# Patient Record
Sex: Male | Born: 1960 | Race: White | Hispanic: No | Marital: Married | Smoking: Never smoker
Health system: Southern US, Community
[De-identification: ages and names within clinical notes are randomized; demographics above are authoritative.]

## PROBLEM LIST (undated history)

## (undated) DIAGNOSIS — G4733 Obstructive sleep apnea (adult) (pediatric): Secondary | ICD-10-CM

## (undated) DIAGNOSIS — E785 Hyperlipidemia, unspecified: Secondary | ICD-10-CM

## (undated) DIAGNOSIS — I10 Essential (primary) hypertension: Secondary | ICD-10-CM

## (undated) DIAGNOSIS — T7840XA Allergy, unspecified, initial encounter: Secondary | ICD-10-CM

## (undated) HISTORY — DX: Hyperlipidemia, unspecified: E78.5

## (undated) HISTORY — DX: Essential (primary) hypertension: I10

## (undated) HISTORY — DX: Allergy, unspecified, initial encounter: T78.40XA

## (undated) HISTORY — DX: Obstructive sleep apnea (adult) (pediatric): G47.33

---

## 1981-04-11 HISTORY — PX: APPENDECTOMY: SHX54

## 2005-04-11 HISTORY — PX: HAND SURGERY: SHX662

## 2005-07-09 ENCOUNTER — Ambulatory Visit (HOSPITAL_COMMUNITY): Admission: RE | Admit: 2005-07-09 | Discharge: 2005-07-09 | Payer: Self-pay | Admitting: Orthopedic Surgery

## 2008-03-31 ENCOUNTER — Encounter: Admission: RE | Admit: 2008-03-31 | Discharge: 2008-03-31 | Payer: Self-pay | Admitting: Orthopaedic Surgery

## 2010-08-27 NOTE — Op Note (Signed)
Oscar Moore, Oscar Moore               ACCOUNT NO.:  [id number]   MEDICAL RECORD NO.:  [id number]          PATIENT TYPE:  AMB   LOCATION:  SDS                          FACILITY:  [facility]   PHYSICIAN:  Dionne Ano. Gramig III, M.D.DATE OF BIRTH:  November 12, 1960   DATE OF PROCEDURE:  DATE OF DISCHARGE:                                 OPERATIVE REPORT   PREOPERATIVE DIAGNOSIS:  Displaced first metacarpal fracture, right thumb  (nascent malunion).   POSTOPERATIVE DIAGNOSIS:  Displaced first metacarpal fracture, right thumb  (nascent malunion).   PROCEDURES:  1.  Open reduction and internal fixation, first metacarpal fracture, right      thumb, secondary to nascent malunion, with condylar blade plate of the      2.0 variety and interfragmentary screw technique.  2.  Superficial radial nerve neurolysis, extensive.  3.  Stress radiography.   SURGEON:  Dionne Ano. Amanda Pea, M.D.   ASSISTANT:  None.   COMPLICATIONS:  None.   ANESTHESIA:  General.   TOURNIQUET TIME:  Less than 90 minutes.   DRAINS:  None.   ESTIMATED BLOOD LOSS:  Minimal.   INDICATION FOR PROCEDURE:  This patient is a pleasant male who presents with  the above-mentioned diagnosis.  I have counseled him in regard to the risks  and benefits of surgery including the risk of infection, bleeding,  anesthesia, damage to normal structures and failure of surgery to accomplish  its intended goals of relieving symptoms and restoring function.  With this  in mind he desires to proceed.  All questions have been encouraged and  answered preoperatively.  This patient was recently seen in [facility].  He subsequently presented to my office greater than three weeks  after his injury with a nascent malunion.  I have discussed with him options  for treatment.  Given the severe deformity, collapse and poor position and  function, I recommended takedown of the nascent malunion and ORIF.  I have  discussed with the patient risks and  benefit, time frame and duration of  recovery, do's and don'ts, etc.  With this in mind, he desires to proceed.   OPERATIVE PROCEDURE:  The patient was given preoperative Ancef, taken to the  operative suite, counseled, the permit was signed.  The extremity to be  operated on was identified, and he then underwent a general anesthesia under  the direction of Dr. Gelene Mink.  He was placed supine, appropriately padded,  prepped and draped in the usual sterile fashion with Betadine scrub and  paint of the 10-minute variety.  The patient then had the arm elevated, the  tourniquet was inflated to 250 mmHg, and the incision was made dorsal  radially between the dorsal and palmar skin regions.  Dissection was carried  down and the superficial radial nerve was identified.  It was encased in a  wad of inflammatory tissue.  It was very carefully under 4.0 loupe  magnification dissected and removed out of harm's way for the remainder of  the case.  This was an extensive superficial radial nerve neurolysis to  sweep this out of the inflammatory  mass of tissue where it was caught.  Following this I then accessed the metacarpal and the metacarpal was in a  malunited-type state.  I performed a takedown of the malunion with a  combination of curette, Freer elevator, periosteal elevator and sharp knife  blade.  The patient had more than four fragments that were highly unstable  once the takedown occurred.  The patient had a gentle piecing of the  fracture together, provisional fixation with K-wires and clamps were held.  X-rays were checked under stress fluoroscopy and all looked quite well.  At  this time I then performed placement of an interfragmentary screw proximal  to distal engaging a butterfly fragment.  This was a 2.0 screw.  Following  this a 2.0 screw was placed dorsal distal to volar proximal.  This engaged  the proximal piece.  Following this a 2.0 condylar blade plate was placed  according to  standard AO technique.  Screws were placed to secure the  fracture.  Following this, all provisional reduction was removed and x-rays  checked.  The patient had excellent integrity of the bone in terms of its  stability and alignment.  X-rays AP, lateral and oblique were taken, and  life fluoroscopy ensured good stability of the construct and restoration of  the proper anatomic alignment.  I was pleased with this, deflated the  tourniquet, covered the plate with Vicryl suture utilizing the periosteal  edges and following this noted that the deep radial artery branch and the  EPB tendon were intact and looking well.  I then closed the wound with  Prolene and ensured that the superficial radial nerve was out of harm's way  during this.  Following this he was placed in a well-padded dressing  followed by a splint, thumb spica in nature, and was transferred to the  recovery room.  He will be discharged on Keflex, Percocet, Robaxin, Peri-  Colace, and will return to see me in the office in 10 days.  All questions  have been encouraged and answered.           ______________________________  Dionne Ano. Everlene Other, M.D.     Nash Mantis  D:  07/09/2005  T:  07/12/2005  Job:  [id number]

## 2012-01-06 ENCOUNTER — Other Ambulatory Visit: Payer: Self-pay | Admitting: Gastroenterology

## 2012-01-06 DIAGNOSIS — R109 Unspecified abdominal pain: Secondary | ICD-10-CM

## 2012-01-23 ENCOUNTER — Ambulatory Visit
Admission: RE | Admit: 2012-01-23 | Discharge: 2012-01-23 | Disposition: A | Discharge status: Home / Self Care | Payer: 59 | Source: Ambulatory Visit | Attending: Gastroenterology | Admitting: Gastroenterology

## 2012-01-23 DIAGNOSIS — R109 Unspecified abdominal pain: Secondary | ICD-10-CM

## 2012-01-23 MED ORDER — IOHEXOL 300 MG/ML  SOLN
100.0000 mL | Freq: Once | INTRAMUSCULAR | Status: AC | PRN
  Administered 2012-01-23: 100 mL via INTRAVENOUS

## 2012-01-25 ENCOUNTER — Other Ambulatory Visit: Payer: Self-pay | Admitting: Gastroenterology

## 2012-01-25 DIAGNOSIS — R109 Unspecified abdominal pain: Secondary | ICD-10-CM

## 2012-01-31 ENCOUNTER — Ambulatory Visit
Admission: RE | Admit: 2012-01-31 | Discharge: 2012-01-31 | Disposition: A | Discharge status: Home / Self Care | Payer: 59 | Source: Ambulatory Visit | Attending: Gastroenterology | Admitting: Gastroenterology

## 2012-01-31 DIAGNOSIS — R109 Unspecified abdominal pain: Secondary | ICD-10-CM

## 2012-03-21 LAB — HM COLONOSCOPY

## 2013-02-01 ENCOUNTER — Other Ambulatory Visit: Payer: Self-pay | Admitting: Gastroenterology

## 2013-02-01 DIAGNOSIS — R911 Solitary pulmonary nodule: Secondary | ICD-10-CM

## 2013-02-19 ENCOUNTER — Other Ambulatory Visit: Payer: 59

## 2013-02-22 ENCOUNTER — Ambulatory Visit
Admission: RE | Admit: 2013-02-22 | Discharge: 2013-02-22 | Disposition: A | Discharge status: Home / Self Care | Payer: 59 | Source: Ambulatory Visit | Attending: Gastroenterology | Admitting: Gastroenterology

## 2013-02-22 DIAGNOSIS — R911 Solitary pulmonary nodule: Secondary | ICD-10-CM

## 2013-02-22 MED ORDER — IOHEXOL 300 MG/ML  SOLN
75.0000 mL | Freq: Once | INTRAMUSCULAR | Status: AC | PRN
  Administered 2013-02-22: 75 mL via INTRAVENOUS

## 2013-05-08 ENCOUNTER — Other Ambulatory Visit: Payer: Self-pay | Admitting: Internal Medicine

## 2013-07-17 ENCOUNTER — Other Ambulatory Visit: Payer: Self-pay | Admitting: Emergency Medicine

## 2013-07-23 DIAGNOSIS — M5136 Other intervertebral disc degeneration, lumbar region: Secondary | ICD-10-CM | POA: Insufficient documentation

## 2013-07-23 DIAGNOSIS — G4733 Obstructive sleep apnea (adult) (pediatric): Secondary | ICD-10-CM | POA: Insufficient documentation

## 2013-07-23 DIAGNOSIS — I10 Essential (primary) hypertension: Secondary | ICD-10-CM | POA: Insufficient documentation

## 2013-07-23 DIAGNOSIS — E349 Endocrine disorder, unspecified: Secondary | ICD-10-CM | POA: Insufficient documentation

## 2013-07-23 DIAGNOSIS — E782 Mixed hyperlipidemia: Secondary | ICD-10-CM | POA: Insufficient documentation

## 2013-07-23 DIAGNOSIS — E785 Hyperlipidemia, unspecified: Secondary | ICD-10-CM

## 2013-07-24 ENCOUNTER — Encounter: Payer: Self-pay | Admitting: Internal Medicine

## 2013-07-24 ENCOUNTER — Ambulatory Visit (INDEPENDENT_AMBULATORY_CARE_PROVIDER_SITE_OTHER): Payer: 59 | Admitting: Internal Medicine

## 2013-07-24 VITALS — BP 124/76 | HR 76 | Temp 98.1°F | Resp 16 | Ht 68.0 in | Wt 192.6 lb

## 2013-07-24 DIAGNOSIS — I10 Essential (primary) hypertension: Secondary | ICD-10-CM

## 2013-07-24 DIAGNOSIS — E785 Hyperlipidemia, unspecified: Secondary | ICD-10-CM

## 2013-07-24 DIAGNOSIS — Z79899 Other long term (current) drug therapy: Secondary | ICD-10-CM

## 2013-07-24 DIAGNOSIS — E291 Testicular hypofunction: Secondary | ICD-10-CM

## 2013-07-24 DIAGNOSIS — E559 Vitamin D deficiency, unspecified: Secondary | ICD-10-CM

## 2013-07-24 DIAGNOSIS — R7309 Other abnormal glucose: Secondary | ICD-10-CM

## 2013-07-24 LAB — CBC WITH DIFFERENTIAL/PLATELET
BASOS ABS: 0.1 10*3/uL (ref 0.0–0.1)
Basophils Relative: 1 % (ref 0–1)
Eosinophils Absolute: 0.2 10*3/uL (ref 0.0–0.7)
Eosinophils Relative: 3 % (ref 0–5)
HCT: 48.2 % (ref 39.0–52.0)
HEMOGLOBIN: 16.7 g/dL (ref 13.0–17.0)
Lymphocytes Relative: 22 % (ref 12–46)
Lymphs Abs: 1.7 10*3/uL (ref 0.7–4.0)
MCH: 27.6 pg (ref 26.0–34.0)
MCHC: 34.6 g/dL (ref 30.0–36.0)
MCV: 79.8 fL (ref 78.0–100.0)
MONOS PCT: 6 % (ref 3–12)
Monocytes Absolute: 0.5 10*3/uL (ref 0.1–1.0)
Neutro Abs: 5.2 10*3/uL (ref 1.7–7.7)
Neutrophils Relative %: 68 % (ref 43–77)
PLATELETS: 277 10*3/uL (ref 150–400)
RBC: 6.04 MIL/uL — AB (ref 4.22–5.81)
RDW: 13.8 % (ref 11.5–15.5)
WBC: 7.7 10*3/uL (ref 4.0–10.5)

## 2013-07-24 LAB — HEMOGLOBIN A1C
Hgb A1c MFr Bld: 5.4 % (ref <5.7)
Mean Plasma Glucose: 108 mg/dL (ref <117)

## 2013-07-24 MED ORDER — TADALAFIL 5 MG PO TABS: 5.0000 mg | ORAL_TABLET | Freq: Every day | ORAL | Status: DC | PRN

## 2013-07-24 MED ORDER — BUTALBITAL-APAP-CAFF-COD 50-325-40-30 MG PO CAPS: ORAL_CAPSULE | ORAL | Status: DC

## 2013-07-24 NOTE — Progress Notes (Signed)
Patient ID: Oscar Moore, male   DOB: [birth year], 53 y.o.   MRN: [id number]    This very nice 53 y.o. MWM lost to F/U who presents with Hx/o Hypertension, Hyperlipidemia, Abn. Glucose and Vitamin D Deficiency. Also he notes a tender cyst medial to his Rt patella.   HTN predates since 2007. BP has been controlled at home. Today's BP: 124/76 mmHg . Patient denies any cardiac type chest pain, palpitations, dyspnea/orthopnea/PND, dizziness, claudication, or dependent edema.    Hyperlipidemia also noted since 2007 is controlled with diet & meds.  Last Cholesterol was 130, Triglycerides were 245, HDL 31  and LDL 50 - at goal. Patient denies myalgias or other med SE's.    Also, the patient is screened for  PreDiabetes/insulin resistance with last A1c of 4.9% in June 2014. Patient denies any symptoms of reactive hypoglycemia, diabetic polys, paresthesias or visual blurring.   Patient has Testosterone Deficiency and reports improved sense of well being on therapy and his lat shot was 5 days ago. Further, Patient has history of Vitamin D Deficiency with last vitamin D of 83 in June 2014. Patient supplements vitamin D without any suspected side-effects.  Medication Sig  . KRILL OIL PO Take by mouth daily.  Marland Kitchen lisinopril (PRINIVIL,ZESTRIL) 20 MG tablet Take 20 mg by mouth daily.  Marland Kitchen LYRICA 75 MG capsule TAKE ONE CAPSULE TWICE A DAY FOR PAIN  . rosuvastatin (CRESTOR) 5 MG tablet Take 5 mg by mouth daily.  Marland Kitchen testosterone cypionate (DEPOTESTOTERONE CYPIONATE) 200 MG/ML injection INJECT 2 MLS INTRAMUSCULARLY EVERY 2 WEEKS  . traMADol (ULTRAM) 50 MG tablet TAKE 1 TO 2 TABLETS BY MOUTH UP TO 4 TIMES A DAY AS NEEDED     Allergies no known allergies  PMHx:   Past Medical History  Diagnosis Date  . Hyperlipidemia   . Hypertension   . Allergy   . DDD (degenerative disc disease)   . OSA (obstructive sleep apnea)   . Other testicular hypofunction    FHx:    Reviewed / unchanged  SHx:    Reviewed /  unchanged   Systems Review: Constitutional: Denies fever, chills, wt changes, headaches, insomnia, fatigue, night sweats, change in appetite. Eyes: Denies redness, blurred vision, diplopia, discharge, itchy, watery eyes.  ENT: Denies discharge, congestion, post nasal drip, epistaxis, sore throat, earache, hearing loss, dental pain, tinnitus, vertigo, sinus pain, snoring.  CV: Denies chest pain, palpitations, irregular heartbeat, syncope, dyspnea, diaphoresis, orthopnea, PND, claudication, edema. Respiratory: denies cough, dyspnea, DOE, pleurisy, hoarseness, laryngitis, wheezing.  Gastrointestinal: Denies dysphagia, odynophagia, heartburn, reflux, water brash, abdominal pain or cramps, nausea, vomiting, bloating, diarrhea, constipation, hematemesis, melena, hematochezia,  or hemorrhoids. Genitourinary: Denies dysuria, frequency, urgency, nocturia, hesitancy, discharge, hematuria, flank pain. Musculoskeletal: Denies arthralgias, myalgias, stiffness, jt. swelling, pain, limp, strain/sprain.  Skin: Denies pruritus, rash, hives, warts, acne, eczema, change in skin lesion(s). Neuro: No weakness, tremor, incoordination, spasms, paresthesia, or pain. Psychiatric: Denies confusion, memory loss, or sensory loss. Endo: Denies change in weight, skin, hair change.  Heme/Lymph: No excessive bleeding, bruising, orenlarged lymph nodes.   Exam:  BP 124/76  Pulse 76  Temp 98.1 F   Resp 16  Ht 5\' 8"    Wt 192 lb 9.6 oz   BMI 29.29 kg/m2  Appears well nourished - in no distress. Eyes: PERRLA, EOMs, conjunctiva no swelling or erythema. Sinuses: No frontal/maxillary tenderness ENT/Mouth: EAC's clear, TM's nl w/o erythema, bulging. Nares clear w/o erythema, swelling, exudates. Oropharynx clear without erythema or exudates. Oral hygiene is  good. Tongue normal, non obstructing. Hearing intact.  Neck: Supple. Thyroid nl. Car 2+/2+ without bruits, nodes or JVD. Chest: Respirations nl with BS clear & equal w/o  rales, rhonchi, wheezing or stridor.  Cor: Heart sounds normal w/ regular rate and rhythm without sig. murmurs, gallops, clicks, or rubs. Peripheral pulses normal and equal  without edema.  Abdomen: Soft & bowel sounds normal. Non-tender w/o guarding, rebound, hernias, masses, or organomegaly.  Lymphatics: Unremarkable.  Musculoskeletal: Full ROM all peripheral extremities, joint stability, 5/5 strength, and normal gait.  Skin: Warm, dry without exposed rashes, lesions, ecchymosis apparent.  Neuro: Cranial nerves intact, reflexes equal bilaterally. Sensory-motor testing grossly intact. Tendon reflexes grossly intact.  Pysch: Alert & oriented x 3. Insight and judgement nl & appropriate. No ideations.  Assessment and Plan:  1. Hypertension - Continue monitor blood pressure at home. Continue diet/meds same.  2. Hyperlipidemia - Continue diet/meds, exercise,& lifestyle modifications. Continue monitor periodic cholesterol/liver & renal functions   3. Hx/o Abnormal Glucose - Continue diet, exercise, lifestyle modifications. Monitor appropriate labs.  4. Vitamin D Deficiency - Continue supplementation.  Recommended regular exercise, BP monitoring, weight control, and discussed med and SE's. Recommended labs to assess and monitor clinical status. Further disposition pending results of labs.

## 2013-07-24 NOTE — Patient Instructions (Signed)
Recommend  the book   "The End of Dieting" by Dr Excell Seltzer  For information on healthy eating to get off all BP  & Cholesterol meds  And avoid getting Heart Disease Cancer and Alzheimer's Disease      Hypertension As your heart beats, it forces blood through your arteries. This force is your blood pressure. If the pressure is too high, it is called hypertension (HTN) or high blood pressure. HTN is dangerous because you may have it and not know it. High blood pressure may mean that your heart has to work harder to pump blood. Your arteries may be narrow or stiff. The extra work puts you at risk for heart disease, stroke, and other problems.  Blood pressure consists of two numbers, a higher number over a lower, 110/72, for example. It is stated as "110 over 72." The ideal is below 120 for the top number (systolic) and under 80 for the bottom (diastolic). Write down your blood pressure today. You should pay close attention to your blood pressure if you have certain conditions such as:  Heart failure.  Prior heart attack.  Diabetes  Chronic kidney disease.  Prior stroke.  Multiple risk factors for heart disease. To see if you have HTN, your blood pressure should be measured while you are seated with your arm held at the level of the heart. It should be measured at least twice. A one-time elevated blood pressure reading (especially in the Emergency Department) does not mean that you need treatment. There may be conditions in which the blood pressure is different between your right and left arms. It is important to see your caregiver soon for a recheck. Most people have essential hypertension which means that there is not a specific cause. This type of high blood pressure may be lowered by changing lifestyle factors such as:  Stress.  Smoking.  Lack of exercise.  Excessive weight.  Drug/tobacco/alcohol use.  Eating less salt. Most people do not have symptoms from high blood  pressure until it has caused damage to the body. Effective treatment can often prevent, delay or reduce that damage. TREATMENT  When a cause has been identified, treatment for high blood pressure is directed at the cause. There are a large number of medications to treat HTN. These fall into several categories, and your caregiver will help you select the medicines that are best for you. Medications may have side effects. You should review side effects with your caregiver. If your blood pressure stays high after you have made lifestyle changes or started on medicines,   Your medication(s) may need to be changed.  Other problems may need to be addressed.  Be certain you understand your prescriptions, and know how and when to take your medicine.  Be sure to follow up with your caregiver within the time frame advised (usually within two weeks) to have your blood pressure rechecked and to review your medications.  If you are taking more than one medicine to lower your blood pressure, make sure you know how and at what times they should be taken. Taking two medicines at the same time can result in blood pressure that is too low. SEEK IMMEDIATE MEDICAL CARE IF:  You develop a severe headache, blurred or changing vision, or confusion.  You have unusual weakness or numbness, or a faint feeling.  You have severe chest or abdominal pain, vomiting, or breathing problems. MAKE SURE YOU:   Understand these instructions.  Will watch your condition.  Will  get help right away if you are not doing well or get worse.   Diabetes and Exercise Exercising regularly is important. It is not just about losing weight. It has many health benefits, such as:  Improving your overall fitness, flexibility, and endurance.  Increasing your bone density.  Helping with weight control.  Decreasing your body fat.  Increasing your muscle strength.  Reducing stress and tension.  Improving your overall  health. People with diabetes who exercise gain additional benefits because exercise:  Reduces appetite.  Improves the body's use of blood sugar (glucose).  Helps lower or control blood glucose.  Decreases blood pressure.  Helps control blood lipids (such as cholesterol and triglycerides).  Improves the body's use of the hormone insulin by:  Increasing the body's insulin sensitivity.  Reducing the body's insulin needs.  Decreases the risk for heart disease because exercising:  Lowers cholesterol and triglycerides levels.  Increases the levels of good cholesterol (such as high-density lipoproteins [HDL]) in the body.  Lowers blood glucose levels. YOUR ACTIVITY PLAN  Choose an activity that you enjoy and set realistic goals. Your health care provider or diabetes educator can help you make an activity plan that works for you. You can break activities into 2 or 3 sessions throughout the day. Doing so is as good as one long session. Exercise ideas include:  Taking the dog for a walk.  Taking the stairs instead of the elevator.  Dancing to your favorite song.  Doing your favorite exercise with a friend. RECOMMENDATIONS FOR EXERCISING WITH TYPE 1 OR TYPE 2 DIABETES   Check your blood glucose before exercising. If blood glucose levels are greater than 240 mg/dL, check for urine ketones. Do not exercise if ketones are present.  Avoid injecting insulin into areas of the body that are going to be exercised. For example, avoid injecting insulin into:  The arms when playing tennis.  The legs when jogging.  Keep a record of:  Food intake before and after you exercise.  Expected peak times of insulin action.  Blood glucose levels before and after you exercise.  The type and amount of exercise you have done.  Review your records with your health care provider. Your health care provider will help you to develop guidelines for adjusting food intake and insulin amounts before and  after exercising.  If you take insulin or oral hypoglycemic agents, watch for signs and symptoms of hypoglycemia. They include:  Dizziness.  Shaking.  Sweating.  Chills.  Confusion.  Drink plenty of water while you exercise to prevent dehydration or heat stroke. Body water is lost during exercise and must be replaced.  Talk to your health care provider before starting an exercise program to make sure it is safe for you. Remember, almost any type of activity is better than none.    Cholesterol Cholesterol is a white, waxy, fat-like protein needed by your body in small amounts. The liver makes all the cholesterol you need. It is carried from the liver by the blood through the blood vessels. Deposits (plaque) may build up on blood vessel walls. This makes the arteries narrower and stiffer. Plaque increases the risk for heart attack and stroke. You cannot feel your cholesterol level even if it is very high. The only way to know is by a blood test to check your lipid (fats) levels. Once you know your cholesterol levels, you should keep a record of the test results. Work with your caregiver to to keep your levels in the  desired range. WHAT THE RESULTS MEAN:  Total cholesterol is a rough measure of all the cholesterol in your blood.  LDL is the so-called bad cholesterol. This is the type that deposits cholesterol in the walls of the arteries. You want this level to be low.  HDL is the good cholesterol because it cleans the arteries and carries the LDL away. You want this level to be high.  Triglycerides are fat that the body can either burn for energy or store. High levels are closely linked to heart disease. DESIRED LEVELS:  Total cholesterol below 200.  LDL below 100 for people at risk, below 70 for very high risk.  HDL above 50 is good, above 60 is best.  Triglycerides below 150. HOW TO LOWER YOUR CHOLESTEROL:  Diet.  Choose fish or white meat chicken and [place], roasted or  baked. Limit fatty cuts of red meat, fried foods, and processed meats, such as sausage and lunch meat.  Eat lots of fresh fruits and vegetables. Choose whole grains, beans, pasta, potatoes and cereals.  Use only small amounts of olive, corn or canola oils. Avoid butter, mayonnaise, shortening or palm kernel oils. Avoid foods with trans-fats.  Use skim/nonfat milk and low-fat/nonfat yogurt and cheeses. Avoid whole milk, cream, ice cream, egg yolks and cheeses. Healthy desserts include angel food cake, ginger snaps, animal crackers, hard candy, popsicles, and low-fat/nonfat frozen yogurt. Avoid pastries, cakes, pies and cookies.  Exercise.  A regular program helps decrease LDL and raises HDL.  Helps with weight control.  Do things that increase your activity level like gardening, walking, or taking the stairs.  Medication.  May be prescribed by your caregiver to help lowering cholesterol and the risk for heart disease.  You may need medicine even if your levels are normal if you have several risk factors. HOME CARE INSTRUCTIONS   Follow your diet and exercise programs as suggested by your caregiver.  Take medications as directed.  Have blood work done when your caregiver feels it is necessary. MAKE SURE YOU:   Understand these instructions.  Will watch your condition.  Will get help right away if you are not doing well or get worse.      Vitamin D Deficiency Vitamin D is an important vitamin that your body needs. Having too little of it in your body is called a deficiency. A very bad deficiency can make your bones soft and can cause a condition called rickets.  Vitamin D is important to your body for different reasons, such as:   It helps your body absorb 2 minerals called calcium and phosphorus.  It helps make your bones healthy.  It may prevent some diseases, such as diabetes and multiple sclerosis.  It helps your muscles and heart. You can get vitamin D in several  ways. It is a natural part of some foods. The vitamin is also added to some dairy products and cereals. Some people take vitamin D supplements. Also, your body makes vitamin D when you are in the sun. It changes the sun's rays into a form of the vitamin that your body can use. CAUSES   Not eating enough foods that contain vitamin D.  Not getting enough sunlight.  Having certain digestive system diseases that make it hard to absorb vitamin D. These diseases include Crohn's disease, chronic pancreatitis, and cystic fibrosis.  Having a surgery in which part of the stomach or small intestine is removed.  Being obese. Fat cells pull vitamin D out of your  blood. That means that obese people may not have enough vitamin D left in their blood and in other body tissues.  Having chronic kidney or liver disease. RISK FACTORS Risk factors are things that make you more likely to develop a vitamin D deficiency. They include:  Being older.  Not being able to get outside very much.  Living in a nursing home.  Having had broken bones.  Having weak or thin bones (osteoporosis).  Having a disease or condition that changes how your body absorbs vitamin D.  Having dark skin.  Some medicines such as seizure medicines or steroids.  Being overweight or obese. SYMPTOMS Mild cases of vitamin D deficiency may not have any symptoms. If you have a very bad case, symptoms may include:  Bone pain.  Muscle pain.  Falling often.  Broken bones caused by a minor injury, due to osteoporosis. DIAGNOSIS A blood test is the best way to tell if you have a vitamin D deficiency. TREATMENT Vitamin D deficiency can be treated in different ways. Treatment for vitamin D deficiency depends on what is causing it. Options include:  Taking vitamin D supplements.  Taking a calcium supplement. Your caregiver will suggest what dose is best for you. HOME CARE INSTRUCTIONS  Take any supplements that your caregiver  prescribes. Follow the directions carefully. Take only the suggested amount.  Have your blood tested 2 months after you start taking supplements.  Eat foods that contain vitamin D. Healthy choices include:  Fortified dairy products, cereals, or juices. Fortified means vitamin D has been added to the food. Check the label on the package to be sure.  Fatty fish like salmon or trout.  Eggs.  Oysters.  Do not use a tanning bed.  Keep your weight at a healthy level. Lose weight if you need to.  Keep all follow-up appointments. Your caregiver will need to perform blood tests to make sure your vitamin D deficiency is going away. SEEK MEDICAL CARE IF:  You have any questions about your treatment.  You continue to have symptoms of vitamin D deficiency.  You have nausea or vomiting.  You are constipated.  You feel confused.  You have severe abdominal or back pain. MAKE SURE YOU:  Understand these instructions.  Will watch your condition.  Will get help right away if you are not doing well or get worse.

## 2013-07-25 LAB — HEPATIC FUNCTION PANEL
ALBUMIN: 4.6 g/dL (ref 3.5–5.2)
ALK PHOS: 49 U/L (ref 39–117)
ALT: 36 U/L (ref 0–53)
AST: 28 U/L (ref 0–37)
BILIRUBIN DIRECT: 0.1 mg/dL (ref 0.0–0.3)
BILIRUBIN INDIRECT: 0.5 mg/dL (ref 0.2–1.2)
Total Bilirubin: 0.6 mg/dL (ref 0.2–1.2)
Total Protein: 6.9 g/dL (ref 6.0–8.3)

## 2013-07-25 LAB — BASIC METABOLIC PANEL WITH GFR
BUN: 13 mg/dL (ref 6–23)
CALCIUM: 9.6 mg/dL (ref 8.4–10.5)
CHLORIDE: 99 meq/L (ref 96–112)
CO2: 28 meq/L (ref 19–32)
CREATININE: 0.95 mg/dL (ref 0.50–1.35)
Glucose, Bld: 84 mg/dL (ref 70–99)
POTASSIUM: 4.2 meq/L (ref 3.5–5.3)
SODIUM: 139 meq/L (ref 135–145)

## 2013-07-25 LAB — TSH: TSH: 4.092 u[IU]/mL (ref 0.350–4.500)

## 2013-07-25 LAB — MAGNESIUM: MAGNESIUM: 1.9 mg/dL (ref 1.5–2.5)

## 2013-07-25 LAB — TESTOSTERONE: Testosterone: 1812.4 ng/dL — ABNORMAL HIGH (ref 300–890)

## 2013-07-25 LAB — VITAMIN D 25 HYDROXY (VIT D DEFICIENCY, FRACTURES): VIT D 25 HYDROXY: 85 ng/mL (ref 30–89)

## 2013-07-25 LAB — LIPID PANEL
CHOL/HDL RATIO: 3.9 ratio
CHOLESTEROL: 132 mg/dL (ref 0–200)
HDL: 34 mg/dL — AB (ref >39)
LDL CALC: 63 mg/dL (ref 0–99)
Triglycerides: 176 mg/dL — ABNORMAL HIGH (ref <150)
VLDL: 35 mg/dL (ref 0–40)

## 2013-07-25 LAB — INSULIN, FASTING: Insulin fasting, serum: 7 u[IU]/mL (ref 3–28)

## 2013-08-03 ENCOUNTER — Encounter: Payer: Self-pay | Admitting: Internal Medicine

## 2013-08-04 ENCOUNTER — Other Ambulatory Visit: Payer: Self-pay | Admitting: Emergency Medicine

## 2013-08-20 ENCOUNTER — Other Ambulatory Visit: Payer: Self-pay | Admitting: Internal Medicine

## 2013-08-20 MED ORDER — ROSUVASTATIN CALCIUM 5 MG PO TABS: 5.0000 mg | ORAL_TABLET | Freq: Every day | ORAL | Status: DC

## 2013-08-23 ENCOUNTER — Other Ambulatory Visit: Payer: Self-pay | Admitting: Emergency Medicine

## 2013-08-27 ENCOUNTER — Encounter: Payer: Self-pay | Admitting: Internal Medicine

## 2013-08-27 ENCOUNTER — Ambulatory Visit (INDEPENDENT_AMBULATORY_CARE_PROVIDER_SITE_OTHER): Payer: 59 | Admitting: Internal Medicine

## 2013-08-27 VITALS — BP 126/84 | HR 80 | Temp 97.9°F | Resp 16 | Ht 68.0 in | Wt 187.8 lb

## 2013-08-27 DIAGNOSIS — L723 Sebaceous cyst: Secondary | ICD-10-CM

## 2013-08-27 NOTE — Progress Notes (Signed)
   Subjective:    Patient ID: Oscar Moore, male    DOB: [birth year], 53 y.o.   MRN: [id number]  HPI Patient presents with c/o a subcutaneous mass just medial to the Rt patella.  Review of Systems pertinent as above  Objective:   Physical Exam   there is a mobile 15 mm cystic mass just medial to the Rt patella &  non tender. After informed consent the area was infilterated and anesthetized with 1.5 cc of marcaine 0.5% w/epi . Then with a # 10 blade,  a 10 mm horiz. Incision is made thru to the SQ and the cystic structure was sharply isolated and dissected free  from the surrounding fatty tissue and delivered intact and appeared  to be a sebaceous cyst. The wound was quenched with hydrogen peroxide and then closed with #2 vertical mattress sutures with prolene 3-0 and sterile dressing was applied.  Assessment & Plan:   1. Sebaceous cyst  -Surgical Excision  - ROV 10-12 days for suture removal

## 2013-08-27 NOTE — Patient Instructions (Signed)

## 2013-09-10 ENCOUNTER — Encounter: Payer: Self-pay | Admitting: Internal Medicine

## 2013-09-10 ENCOUNTER — Ambulatory Visit: Payer: 59 | Admitting: Internal Medicine

## 2013-09-10 VITALS — BP 128/80 | HR 88 | Temp 97.5°F | Resp 18 | Ht 68.0 in | Wt 195.2 lb

## 2013-09-10 DIAGNOSIS — L723 Sebaceous cyst: Secondary | ICD-10-CM

## 2013-09-10 NOTE — Progress Notes (Signed)
Patient ID: Oscar Moore, male   DOB: [birth year], 53 y.o.   MRN: [id number]  S/p excision sebaceous rt knee.  BP 128/80  Pulse 88  Temp(Src) 97.5 F (36.4 C) (Temporal)  Resp 18  Ht 5\' 8"  (1.727 m)  Wt 195 lb 3.2 oz (88.542 kg)  BMI 29.69 kg/m2   Sutures from Rt Knee surgical site removed w/o difficulty. No signs of infection . Good healing ridge evident.

## 2013-10-01 ENCOUNTER — Other Ambulatory Visit: Payer: Self-pay | Admitting: *Deleted

## 2013-10-01 MED ORDER — ROSUVASTATIN CALCIUM 5 MG PO TABS: 5.0000 mg | ORAL_TABLET | Freq: Every day | ORAL | Status: DC

## 2013-10-01 MED ORDER — LISINOPRIL 20 MG PO TABS: 20.0000 mg | ORAL_TABLET | Freq: Every day | ORAL | Status: DC

## 2013-10-03 ENCOUNTER — Other Ambulatory Visit: Payer: Self-pay

## 2013-10-03 MED ORDER — PREGABALIN 75 MG PO CAPS: 75.0000 mg | ORAL_CAPSULE | Freq: Two times a day (BID) | ORAL | Status: DC

## 2013-10-13 ENCOUNTER — Other Ambulatory Visit: Payer: Self-pay | Admitting: Physician Assistant

## 2013-10-24 NOTE — Patient Instructions (Addendum)
Recommend the book "The END of DIETING" by Dr Baker Janus   and the book "The END of DIABETES " by Dr Excell Seltzer  At [email or web address] - get book & Audio CD's      Being diabetic has a  300% increased risk for heart attack, stroke, cancer, and alzheimer- type vascular dementia. It is very important that you work harder with diet by avoiding all foods that are white except chicken & fish. Avoid white rice (brown & wild rice is OK), white potatoes (sweetpotatoes in moderation is OK), White bread or wheat bread or anything made out of white flour like bagels, donuts, rolls, buns, biscuits, cakes, pastries, cookies, pizza crust, and pasta (made from white flour & egg whites) - vegetarian pasta or spinach or wheat pasta is OK. Multigrain breads like Arnold's or [employer], or multigrain sandwich thins or flatbreads.  Diet, exercise and weight loss can reverse and cure diabetes in the early stages.  Diet, exercise and weight loss is very important in the control and prevention of complications of diabetes which affects every system in your body, ie. Brain - dementia/stroke, eyes - glaucoma/blindness, heart - heart attack/heart failure, kidneys - dialysis, stomach - gastric paralysis, intestines - malabsorption, nerves - severe painful neuritis, circulation - gangrene & loss of a leg(s), and finally cancer and Alzheimers.    I recommend avoid fried & greasy foods,  sweets/candy, white rice (brown or wild rice or Quinoa is OK), white potatoes (sweet potatoes are OK) - anything made from white flour - bagels, doughnuts, rolls, buns, biscuits,white and wheat breads, pizza crust and traditional pasta made of white flour & egg white(vegetarian pasta or spinach or wheat pasta is OK).  Multi-grain bread is OK - like multi-grain flat bread or sandwich thins. Avoid alcohol in excess. Exercise is also important.    Eat all the vegetables you want - avoid meat, especially red meat and dairy - especially cheese.  Cheese  is the most concentrated form of trans-fats which is the worst thing to clog up our arteries. Veggie cheese is OK which can be found in the fresh produce section at [employer] or [employer] or [employer]   Preventive Care for Adults A healthy lifestyle and preventive care can promote health and wellness. Preventive health guidelines for men include the following key practices:  A routine yearly physical is a good way to check with your health care provider about your health and preventative screening. It is a chance to share any concerns and updates on your health and to receive a thorough exam.  Visit your dentist for a routine exam and preventative care every 6 months. Brush your teeth twice a day and floss once a day. Good oral hygiene prevents tooth decay and gum disease.  The frequency of eye exams is based on your age, health, family medical history, use of contact lenses, and other factors. Follow your health care provider's recommendations for frequency of eye exams.  Eat a healthy diet. Foods such as vegetables, fruits, whole grains, low-fat dairy products, and lean protein foods contain the nutrients you need without too many calories. Decrease your intake of foods high in solid fats, added sugars, and salt. Eat the right amount of calories for you.Get information about a proper diet from your health care provider, if necessary.  Regular physical exercise is one of the most important things you can do for your health. Most adults should get at least 150 minutes of moderate-intensity exercise (any activity  increases your heart rate and causes you to sweat) each week. In addition, most adults need muscle-strengthening exercises on 2 or more days a week.  Maintain a healthy weight. The body mass index (BMI) is a screening tool to identify possible weight problems. It provides an estimate of body fat based on height and weight. Your health care provider can find your BMI and can help you  achieve or maintain a healthy weight.For adults 20 years and older:  A BMI below 18.5 is considered underweight.  A BMI of 18.5 to 24.9 is normal.  A BMI of 25 to 29.9 is considered overweight.  A BMI of 30 and above is considered obese.  Maintain normal blood lipids and cholesterol levels by exercising and minimizing your intake of saturated fat. Eat a balanced diet with plenty of fruit and vegetables. Blood tests for lipids and cholesterol should begin at age 20 and be repeated every 5 years. If your lipid or cholesterol levels are high, you are over 50, or you are at high risk for heart disease, you may need your cholesterol levels checked more frequently.Ongoing high lipid and cholesterol levels should be treated with medicines if diet and exercise are not working.  If you smoke, find out from your health care provider how to quit. If you do not use tobacco, do not start.  Lung cancer screening is recommended for adults aged 55-80 years who are at high risk for developing lung cancer because of a history of smoking. A yearly low-dose CT scan of the lungs is recommended for people who have at least a 30-pack-year history of smoking and are a current smoker or have quit within the past 15 years. A pack year of smoking is smoking an average of 1 pack of cigarettes a day for 1 year (for example: 1 pack a day for 30 years or 2 packs a day for 15 years). Yearly screening should continue until the smoker has stopped smoking for at least 15 years. Yearly screening should be stopped for people who develop a health problem that would prevent them from having lung cancer treatment.  If you choose to drink alcohol, do not have more than 2 drinks per day. One drink is considered to be 12 ounces (355 mL) of beer, 5 ounces (148 mL) of wine, or 1.5 ounces (44 mL) of liquor.  Avoid use of street drugs. Do not share needles with anyone. Ask for help if you need support or instructions about stopping the use of  drugs.  High blood pressure causes heart disease and increases the risk of stroke. Your blood pressure should be checked at least every 1-2 years. Ongoing high blood pressure should be treated with medicines, if weight loss and exercise are not effective.  If you are 45-79 years old, ask your health care provider if you should take aspirin to prevent heart disease.  Diabetes screening involves taking a blood sample to check your fasting blood sugar level. This should be done once every 3 years, after age 45, if you are within normal weight and without risk factors for diabetes. Testing should be considered at a younger age or be carried out more frequently if you are overweight and have at least 1 risk factor for diabetes.  Colorectal cancer can be detected and often prevented. Most routine colorectal cancer screening begins at the age of 50 and continues through age 75. However, your health care provider may recommend screening at an earlier age if you have risk   factors for colon cancer. On a yearly basis, your health care provider may provide home test kits to check for hidden blood in the stool. Use of a small camera at the end of a tube to directly examine the colon (sigmoidoscopy or colonoscopy) can detect the earliest forms of colorectal cancer. Talk to your health care provider about this at age 50, when routine screening begins. Direct exam of the colon should be repeated every 5-10 years through age 75, unless early forms of precancerous polyps or small growths are found.  People who are at an increased risk for hepatitis B should be screened for this virus. You are considered at high risk for hepatitis B if:  You were born in a country where hepatitis B occurs often. Talk with your health care provider about which countries are considered high risk.  Your parents were born in a high-risk country and you have not received a shot to protect against hepatitis B (hepatitis B vaccine).  You have  HIV or AIDS.  You use needles to inject street drugs.  You live with, or have sex with, someone who has hepatitis B.  You are a man who has sex with other men (MSM).  You get hemodialysis treatment.  You take certain medicines for conditions such as cancer, organ transplantation, and autoimmune conditions.  Hepatitis C blood testing is recommended for all people born from 1945 through 1965 and any individual with known risks for hepatitis C.  Practice safe sex. Use condoms and avoid high-risk sexual practices to reduce the spread of sexually transmitted infections (STIs). STIs include gonorrhea, chlamydia, syphilis, trichomonas, herpes, HPV, and human immunodeficiency virus (HIV). Herpes, HIV, and HPV are viral illnesses that have no cure. They can result in disability, cancer, and death.  If you are at risk of being infected with HIV, it is recommended that you take a prescription medicine daily to prevent HIV infection. This is called preexposure prophylaxis (PrEP). You are considered at risk if:  You are a man who has sex with other men (MSM) and have other risk factors.  You are a heterosexual man, are sexually active, and are at increased risk for HIV infection.  You take drugs by injection.  You are sexually active with a partner who has HIV.  Talk with your health care provider about whether you are at high risk of being infected with HIV. If you choose to begin PrEP, you should first be tested for HIV. You should then be tested every 3 months for as long as you are taking PrEP.  A one-time screening for abdominal aortic aneurysm (AAA) and surgical repair of large AAAs by ultrasound are recommended for men ages 65 to 75 years who are current or former smokers.  Healthy men should no longer receive prostate-specific antigen (PSA) blood tests as part of routine cancer screening. Talk with your health care provider about prostate cancer screening.  Testicular cancer screening is  not recommended for adult males who have no symptoms. Screening includes self-exam, a health care provider exam, and other screening tests. Consult with your health care provider about any symptoms you have or any concerns you have about testicular cancer.  Use sunscreen. Apply sunscreen liberally and repeatedly throughout the day. You should seek shade when your shadow is shorter than you. Protect yourself by wearing long sleeves, pants, a wide-brimmed hat, and sunglasses year round, whenever you are outdoors.  Once a month, do a whole-body skin exam, using a mirror to look   at the skin on your back. Tell your health care provider about new moles, moles that have irregular borders, moles that are larger than a pencil eraser, or moles that have changed in shape or color.  Stay current with required vaccines (immunizations).  Influenza vaccine. All adults should be immunized every year.  Tetanus, diphtheria, and acellular pertussis (Td, Tdap) vaccine. An adult who has not previously received Tdap or who does not know his vaccine status should receive 1 dose of Tdap. This initial dose should be followed by tetanus and diphtheria toxoids (Td) booster doses every 10 years. Adults with an unknown or incomplete history of completing a 3-dose immunization series with Td-containing vaccines should begin or complete a primary immunization series including a Tdap dose. Adults should receive a Td booster every 10 years.  Varicella vaccine. An adult without evidence of immunity to varicella should receive 2 doses or a second dose if he has previously received 1 dose.  Human papillomavirus (HPV) vaccine. Males aged 13-21 years who have not received the vaccine previously should receive the 3-dose series. Males aged 22-26 years may be immunized. Immunization is recommended through the age of 26 years for any male who has sex with males and did not get any or all doses earlier. Immunization is recommended for any  person with an immunocompromised condition through the age of 26 years if he did not get any or all doses earlier. During the 3-dose series, the second dose should be obtained 4-8 weeks after the first dose. The third dose should be obtained 24 weeks after the first dose and 16 weeks after the second dose.  Zoster vaccine. One dose is recommended for adults aged 60 years or older unless certain conditions are present.  Measles, mumps, and rubella (MMR) vaccine. Adults born before 1957 generally are considered immune to measles and mumps. Adults born in 1957 or later should have 1 or more doses of MMR vaccine unless there is a contraindication to the vaccine or there is laboratory evidence of immunity to each of the three diseases. A routine second dose of MMR vaccine should be obtained at least 28 days after the first dose for students attending postsecondary schools, health care workers, or international travelers. People who received inactivated measles vaccine or an unknown type of measles vaccine during 1963-1967 should receive 2 doses of MMR vaccine. People who received inactivated mumps vaccine or an unknown type of mumps vaccine before 1979 and are at high risk for mumps infection should consider immunization with 2 doses of MMR vaccine. Unvaccinated health care workers born before 1957 who lack laboratory evidence of measles, mumps, or rubella immunity or laboratory confirmation of disease should consider measles and mumps immunization with 2 doses of MMR vaccine or rubella immunization with 1 dose of MMR vaccine.  Pneumococcal 13-valent conjugate (PCV13) vaccine. When indicated, a person who is uncertain of his immunization history and has no record of immunization should receive the PCV13 vaccine. An adult aged 19 years or older who has certain medical conditions and has not been previously immunized should receive 1 dose of PCV13 vaccine. This PCV13 should be followed with a dose of pneumococcal  polysaccharide (PPSV23) vaccine. The PPSV23 vaccine dose should be obtained at least 8 weeks after the dose of PCV13 vaccine. An adult aged 19 years or older who has certain medical conditions and previously received 1 or more doses of PPSV23 vaccine should receive 1 dose of PCV13. The PCV13 vaccine dose should be obtained 1   1 or more years after the last PPSV23 vaccine dose.  Pneumococcal polysaccharide (PPSV23) vaccine. When PCV13 is also indicated, PCV13 should be obtained first. All adults aged 4 years and older should be immunized. An adult younger than age 41 years who has certain medical conditions should be immunized. Any person who resides in a nursing home or long-term care facility should be immunized. An adult smoker should be immunized. People with an immunocompromised condition and certain other conditions should receive both PCV13 and PPSV23 vaccines. People with human immunodeficiency virus (HIV) infection should be immunized as soon as possible after diagnosis. Immunization during chemotherapy or radiation therapy should be avoided. Routine use of PPSV23 vaccine is not recommended for American Indians, [place]atives, or people younger than 65 years unless there are medical conditions that require PPSV23 vaccine. When indicated, people who have unknown immunization and have no record of immunization should receive PPSV23 vaccine. One-time revaccination 5 years after the first dose of PPSV23 is recommended for people aged 19-64 years who have chronic kidney failure, nephrotic syndrome, asplenia, or immunocompromised conditions. People who received 1-2 doses of PPSV23 before age 85 years should receive another dose of PPSV23 vaccine at age 77 years or later if at least 5 years have passed since the previous dose. Doses of PPSV23 are not needed for people immunized with PPSV23 at or after age 64 years.  Meningococcal vaccine. Adults with asplenia or persistent complement component  deficiencies should receive 2 doses of quadrivalent meningococcal conjugate (MenACWY-D) vaccine. The doses should be obtained at least 2 months apart. Microbiologists working with certain meningococcal bacteria, [employer] recruits, people at risk during an outbreak, and people who travel to or live in countries with a high rate of meningitis should be immunized. A first-year college student up through age 32 years who is living in a residence hall should receive a dose if he did not receive a dose on or after his 16th birthday. Adults who have certain high-risk conditions should receive one or more doses of vaccine.  Hepatitis A vaccine. Adults who wish to be protected from this disease, have certain high-risk conditions, work with hepatitis A-infected animals, work in hepatitis A research labs, or travel to or work in countries with a high rate of hepatitis A should be immunized. Adults who were previously unvaccinated and who anticipate close contact with an international adoptee during the first 60 days after arrival in the [place] from a country with a high rate of hepatitis A should be immunized.  Hepatitis B vaccine. Adults should be immunized if they wish to be protected from this disease, have certain high-risk conditions, may be exposed to blood or other infectious body fluids, are household contacts or sex partners of hepatitis B positive people, are clients or workers in certain care facilities, or travel to or work in countries with a high rate of hepatitis B.  Haemophilus influenzae type b (Hib) vaccine. A previously unvaccinated person with asplenia or sickle cell disease or having a scheduled splenectomy should receive 1 dose of Hib vaccine. Regardless of previous immunization, a recipient of a hematopoietic stem cell transplant should receive a 3-dose series 6-12 months after his successful transplant. Hib vaccine is not recommended for adults with HIV infection. Preventive Service /  Frequency Ages 38 to 18  Blood pressure check.** / Every 1 to 2 years.  Lipid and cholesterol check.** / Every 5 years beginning at age 59.  Lung cancer screening. / Every year if you are aged  55-80 years and have a 30-pack-year history of smoking and currently smoke or have quit within the past 15 years. Yearly screening is stopped once you have quit smoking for at least 15 years or develop a health problem that would prevent you from having lung cancer treatment.  Fecal occult blood test (FOBT) of stool. / Every year beginning at age 83 and continuing until age 33. You may not have to do this test if you get a colonoscopy every 10 years.  Flexible sigmoidoscopy** or colonoscopy.** / Every 5 years for a flexible sigmoidoscopy or every 10 years for a colonoscopy beginning at age 86 and continuing until age 46.  Hepatitis C blood test.** / For all people born from [birth year] through 1965 and any individual with known risks for hepatitis C.  Skin self-exam. / Monthly.  Influenza vaccine. / Every year.  Tetanus, diphtheria, and acellular pertussis (Tdap/Td) vaccine.** / Consult your health care provider. 1 dose of Td every 10 years.  Varicella vaccine.** / Consult your health care provider.  Zoster vaccine.** / 1 dose for adults aged 58 years or older.  Measles, mumps, rubella (MMR) vaccine.** / You need at least 1 dose of MMR if you were born in 1957 or later. You may also need a second dose.  Pneumococcal 13-valent conjugate (PCV13) vaccine.** / Consult your health care provider.  Pneumococcal polysaccharide (PPSV23) vaccine.** / 1 to 2 doses if you smoke cigarettes or if you have certain conditions.  Meningococcal vaccine.** / Consult your health care provider.  Hepatitis A vaccine.** / Consult your health care provider.  Hepatitis B vaccine.** / Consult your health care provider.  Haemophilus influenzae type b (Hib) vaccine.** / Consult your health care provider. Ages 2 and  over  Blood pressure check.** / Every 1 to 2 years.  Lipid and cholesterol check.**/ Every 5 years beginning at age 59.  Lung cancer screening. / Every year if you are aged 19-80 years and have a 30-pack-year history of smoking and currently smoke or have quit within the past 15 years. Yearly screening is stopped once you have quit smoking for at least 15 years or develop a health problem that would prevent you from having lung cancer treatment.  Fecal occult blood test (FOBT) of stool. / Every year beginning at age 36 and continuing until age 70. You may not have to do this test if you get a colonoscopy every 10 years.  Flexible sigmoidoscopy** or colonoscopy.** / Every 5 years for a flexible sigmoidoscopy or every 10 years for a colonoscopy beginning at age 68 and continuing until age 2.  Hepatitis C blood test.** / For all people born from [birth year] through 1965 and any individual with known risks for hepatitis C.  Abdominal aortic aneurysm (AAA) screening.** / A one-time screening for ages 28 to 79 years who are current or former smokers.  Skin self-exam. / Monthly.  Influenza vaccine. / Every year.  Tetanus, diphtheria, and acellular pertussis (Tdap/Td) vaccine.** / 1 dose of Td every 10 years.  Varicella vaccine.** / Consult your health care provider.  Zoster vaccine.** / 1 dose for adults aged 7 years or older.  Pneumococcal 13-valent conjugate (PCV13) vaccine.** / Consult your health care provider.  Pneumococcal polysaccharide (PPSV23) vaccine.** / 1 dose for all adults aged 57 years and older.  Meningococcal vaccine.** / Consult your health care provider.  Hepatitis A vaccine.** / Consult your health care provider.  Hepatitis B vaccine.** / Consult your health care provider.  Haemophilus influenzae type  b (Hib) vaccine.** / Consult your health care provider.

## 2013-10-25 ENCOUNTER — Ambulatory Visit (INDEPENDENT_AMBULATORY_CARE_PROVIDER_SITE_OTHER): Payer: 59 | Admitting: Internal Medicine

## 2013-10-25 ENCOUNTER — Encounter: Payer: Self-pay | Admitting: Internal Medicine

## 2013-10-25 VITALS — BP 130/88 | HR 84 | Temp 97.6°F | Resp 16 | Ht 69.0 in | Wt 192.6 lb

## 2013-10-25 DIAGNOSIS — I10 Essential (primary) hypertension: Secondary | ICD-10-CM

## 2013-10-25 DIAGNOSIS — R7402 Elevation of levels of lactic acid dehydrogenase (LDH): Secondary | ICD-10-CM

## 2013-10-25 DIAGNOSIS — R740 Nonspecific elevation of levels of transaminase and lactic acid dehydrogenase [LDH]: Secondary | ICD-10-CM

## 2013-10-25 DIAGNOSIS — Z125 Encounter for screening for malignant neoplasm of prostate: Secondary | ICD-10-CM

## 2013-10-25 DIAGNOSIS — Z1212 Encounter for screening for malignant neoplasm of rectum: Secondary | ICD-10-CM

## 2013-10-25 DIAGNOSIS — Z111 Encounter for screening for respiratory tuberculosis: Secondary | ICD-10-CM

## 2013-10-25 DIAGNOSIS — Z23 Encounter for immunization: Secondary | ICD-10-CM

## 2013-10-25 DIAGNOSIS — E559 Vitamin D deficiency, unspecified: Secondary | ICD-10-CM

## 2013-10-25 DIAGNOSIS — Z113 Encounter for screening for infections with a predominantly sexual mode of transmission: Secondary | ICD-10-CM

## 2013-10-25 DIAGNOSIS — Z0000 Encounter for general adult medical examination without abnormal findings: Secondary | ICD-10-CM

## 2013-10-25 LAB — CBC WITH DIFFERENTIAL/PLATELET
BASOS ABS: 0.1 10*3/uL (ref 0.0–0.1)
BASOS PCT: 1 % (ref 0–1)
EOS ABS: 0.3 10*3/uL (ref 0.0–0.7)
Eosinophils Relative: 4 % (ref 0–5)
HCT: 49.9 % (ref 39.0–52.0)
Hemoglobin: 16.6 g/dL (ref 13.0–17.0)
LYMPHS ABS: 1.6 10*3/uL (ref 0.7–4.0)
Lymphocytes Relative: 23 % (ref 12–46)
MCH: 27.3 pg (ref 26.0–34.0)
MCHC: 33.3 g/dL (ref 30.0–36.0)
MCV: 82.1 fL (ref 78.0–100.0)
Monocytes Absolute: 0.6 10*3/uL (ref 0.1–1.0)
Monocytes Relative: 8 % (ref 3–12)
NEUTROS PCT: 64 % (ref 43–77)
Neutro Abs: 4.5 10*3/uL (ref 1.7–7.7)
PLATELETS: 241 10*3/uL (ref 150–400)
RBC: 6.08 MIL/uL — AB (ref 4.22–5.81)
RDW: 14.2 % (ref 11.5–15.5)
WBC: 7.1 10*3/uL (ref 4.0–10.5)

## 2013-10-25 NOTE — Progress Notes (Signed)
Patient ID: Oscar Moore, male   DOB: 1960-08-19, 53 y.o.   MRN: [id number]   Annual Screening Comprehensive Examination  This very nice 53 y.o.MWM presents for complete physical.  Patient has been followed for HTN, Hyperlipidemia, Testosterone  and Vitamin D Deficiency. Pasteint also has DDD with both Cx and Lumb DDD. His last EDSI was in 2009 per Dr Patrice Paradise. Infrequently he has LBP w/ Rt Sciatica.   HTN predates since 2007. Patient's BP has been controlled at home.Today's BP: 130/88 mmHg. Patient denies any cardiac symptoms as chest pain, palpitations, shortness of breath, dizziness or ankle swelling.   Patient's hyperlipidemia (2007)is controlled with diet and medications. Patient denies myalgias or other medication SE's. Last lipids were at goal as below. Lab Results  Component Value Date   CHOL 132 07/24/2013   HDL 34* 07/24/2013   LDLCALC 63 07/24/2013   TRIG 176* 07/24/2013   CHOLHDL 3.9 07/24/2013    Patient has been screened for  prediabetes and last A1c was  5.4% in Apr 2015.  Patient denies reactive hypoglycemic symptoms, visual blurring, diabetic polys or paresthesias.    Also, patient has Testosterone Deficiency with a level of 166 in Feb 2015. Patient is on replacement Therapy with improved sense of stamina and well being but he sells it is "wearing off" by the 12th day of his 2 wk dose interval. Finally, patient has history of Vitamin D Deficiency and last vitamin D was 85 in Apr 2015.   Medication Sig  . CELEBREX 200 MG capsule as needed.   . cetirizine  10 MG tablet Take 10 mg by mouth daily.  Marland Kitchen esomeprazole  20 MG cap/OTC Take 20 mg by mouth daily   . KRILL OIL  Take by mouth daily.  Marland Kitchen lisinopril  20 MG tablet Take 1 tablet  by mouth daily.  Marland Kitchen LYRICA 75 MG capsule Take 1 capsule by mouth 2  times daily.  . rosuvastatin (CRESTOR) 5 MG tablet Take 1 tablet  daily  . tadalafil (CIALIS) 5 MG tablet Take 1 tablet by mouth daily as needed   .  (DEPOTESTCYPIO 200 MG/ML  Take 2.0  cc Q 2 WKs  . traMADol  50 MG tablet TAKE 1-2 TAB 4 TIMES A DAY AS NEEDED  . NASACORT AQ  Uses PRN- OTC   No Known Allergies  Past Medical History  Diagnosis Date  . Hyperlipidemia   . Hypertension   . Allergy   . DDD (degenerative disc disease)   . OSA (obstructive sleep apnea)   . Other testicular hypofunction    Past Surgical History  Procedure Laterality Date  . Appendectomy  1983  . Hand surgery Right 2007   Family History  Problem Relation Age of Onset  . Cancer Father     SCC- head and neck   History   Social History  . Marital Status: Married    Spouse Name: N/A    Number of Children: N/A  . Years of Education: N/A   Occupational History  . 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 Smoking status: Never Smoker   . Smokeless tobacco: Not on file  . Alcohol Use: 1.0 oz/week    2 drink(s) per week  . Drug Use: No  . Sexual Activity: Not on file    ROS Constitutional: Denies fever, chills, weight loss/gain, headaches, insomnia, fatigue, night sweats or change in appetite. Eyes:  Denies redness, blurred vision, diplopia, discharge, itchy or watery eyes.  ENT: Denies discharge, congestion, post nasal drip, epistaxis, sore throat, earache, hearing loss, dental pain, Tinnitus, Vertigo, Sinus pain or snoring.  Cardio: Denies chest pain, palpitations, irregular heartbeat, syncope, dyspnea, diaphoresis, orthopnea, PND, claudication or edema Respiratory: denies cough, dyspnea, DOE, pleurisy, hoarseness, laryngitis or wheezing.  Gastrointestinal: Denies dysphagia, heartburn, reflux, water brash, pain, cramps, nausea, vomiting, bloating, diarrhea, constipation, hematemesis, melena, hematochezia, jaundice or hemorrhoids Genitourinary: Denies dysuria, frequency, urgency, nocturia, hesitancy, discharge, hematuria or flank pain Musculoskeletal: Denies arthralgia, myalgia, stiffness, Jt. Swelling, pain,  limp or strain/sprain. Denies Falls. Skin: Denies puritis, rash, hives, warts, acne, eczema or change in skin lesion Neuro: No weakness, tremor, incoordination, spasms, paresthesia or pain Psychiatric: Denies confusion, memory loss or sensory loss. Denies Depression. Endocrine: Denies change in weight, skin, hair change, nocturia, and paresthesia, diabetic polys, visual blurring or hyper / hypo glycemic episodes.  Heme/Lymph: No excessive bleeding, bruising or enlarged lymph nodes.  Physical Exam  BP 130/88  Pulse 84  Temp 97.6 F   Resp 16  Ht 5\' 9"    Wt 192 lb 9.6 oz   BMI 28.43 kg/m2  General Appearance: Well nourished, in no apparent distress. Eyes: PERRLA, EOMs, conjunctiva no swelling or erythema, normal fundi and vessels. Sinuses: No frontal/maxillary tenderness ENT/Mouth: EACs patent / TMs  nl. Nares clear without erythema, swelling, mucoid exudates. Oral hygiene is good. No erythema, swelling, or exudate. Tongue normal, non-obstructing. Tonsils not swollen or erythematous. Hearing normal.  Neck: Supple, thyroid normal. No bruits, nodes or JVD. Respiratory: Respiratory effort normal.  BS equal and clear bilateral without rales, rhonci, wheezing or stridor. Cardio: Heart sounds are normal with regular rate and rhythm and no murmurs, rubs or gallops. Peripheral pulses are normal and equal bilaterally without edema. No aortic or femoral bruits. Chest: symmetric with normal excursions and percussion.  Abdomen: Flat, soft, with bowl sounds. Nontender, no guarding, rebound, hernias, masses, or organomegaly.  Lymphatics: Non tender without lymphadenopathy.  Genitourinary: No hernias.Testes nl. DRE - prostate nl for age - smooth & firm w/o nodules. Musculoskeletal: Full ROM all peripheral extremities, joint stability, 5/5 strength, and normal gait. Skin: Warm and dry without rashes, lesions, cyanosis, clubbing or  ecchymosis.  Neuro: Cranial nerves intact, reflexes equal bilaterally.  Normal muscle tone, no cerebellar symptoms. Sensation intact.  Pysch: Awake and oriented X 3with normal affect, insight and judgment appropriate.   ssessment and Plan  1. Annual Screening Examination 2. Hypertension  3. Hyperlipidemia 4. Pre Diabetes 5. Vitamin D Deficiency  Continue prudent diet as discussed, weight control, BP monitoring, regular exercise, and medications as discussed.  Discussed med effects and SE's. Routine screening labs and tests as requested with regular follow-up as recommended. Re Testosterone dosing, he was given optios of 2 cc q14d or 1 cc q7d .

## 2013-10-26 LAB — BASIC METABOLIC PANEL WITH GFR
BUN: 15 mg/dL (ref 6–23)
CO2: 31 mEq/L (ref 19–32)
CREATININE: 1.02 mg/dL (ref 0.50–1.35)
Calcium: 9.3 mg/dL (ref 8.4–10.5)
Chloride: 100 mEq/L (ref 96–112)
GFR, EST NON AFRICAN AMERICAN: 84 mL/min
Glucose, Bld: 71 mg/dL (ref 70–99)
Potassium: 4 mEq/L (ref 3.5–5.3)
SODIUM: 137 meq/L (ref 135–145)

## 2013-10-26 LAB — URINALYSIS, MICROSCOPIC ONLY
BACTERIA UA: NONE SEEN
CASTS: NONE SEEN
CRYSTALS: NONE SEEN
SQUAMOUS EPITHELIAL / LPF: NONE SEEN

## 2013-10-26 LAB — HEPATITIS A ANTIBODY, TOTAL: Hep A Total Ab: NONREACTIVE

## 2013-10-26 LAB — HEMOGLOBIN A1C
Hgb A1c MFr Bld: 5.4 % (ref <5.7)
Mean Plasma Glucose: 108 mg/dL (ref <117)

## 2013-10-26 LAB — MICROALBUMIN / CREATININE URINE RATIO
CREATININE, URINE: 108.5 mg/dL
Microalb Creat Ratio: 6.7 mg/g (ref 0.0–30.0)
Microalb, Ur: 0.73 mg/dL (ref 0.00–1.89)

## 2013-10-26 LAB — HEPATIC FUNCTION PANEL
ALT: 39 U/L (ref 0–53)
AST: 27 U/L (ref 0–37)
Albumin: 4.3 g/dL (ref 3.5–5.2)
Alkaline Phosphatase: 53 U/L (ref 39–117)
BILIRUBIN DIRECT: 0.1 mg/dL (ref 0.0–0.3)
BILIRUBIN TOTAL: 0.5 mg/dL (ref 0.2–1.2)
Indirect Bilirubin: 0.4 mg/dL (ref 0.2–1.2)
Total Protein: 6.7 g/dL (ref 6.0–8.3)

## 2013-10-26 LAB — MAGNESIUM: MAGNESIUM: 1.9 mg/dL (ref 1.5–2.5)

## 2013-10-26 LAB — LIPID PANEL
CHOL/HDL RATIO: 4.1 ratio
CHOLESTEROL: 124 mg/dL (ref 0–200)
HDL: 30 mg/dL — AB (ref >39)
LDL Cholesterol: 52 mg/dL (ref 0–99)
Triglycerides: 208 mg/dL — ABNORMAL HIGH (ref <150)
VLDL: 42 mg/dL — ABNORMAL HIGH (ref 0–40)

## 2013-10-26 LAB — VITAMIN B12: VITAMIN B 12: 755 pg/mL (ref 211–911)

## 2013-10-26 LAB — HEPATITIS B SURFACE ANTIBODY,QUALITATIVE: HEP B S AB: NEGATIVE

## 2013-10-26 LAB — RPR

## 2013-10-26 LAB — HEPATITIS B CORE ANTIBODY, TOTAL: Hep B Core Total Ab: NONREACTIVE

## 2013-10-26 LAB — PSA: PSA: 1.55 ng/mL (ref <=4.00)

## 2013-10-26 LAB — VITAMIN D 25 HYDROXY (VIT D DEFICIENCY, FRACTURES): VIT D 25 HYDROXY: 98 ng/mL — AB (ref 30–89)

## 2013-10-26 LAB — TESTOSTERONE: Testosterone: 292 ng/dL — ABNORMAL LOW (ref 300–890)

## 2013-10-26 LAB — HEPATITIS C ANTIBODY: HCV Ab: NEGATIVE

## 2013-10-26 LAB — INSULIN, FASTING: INSULIN FASTING, SERUM: 45 u[IU]/mL — AB (ref 3–28)

## 2013-10-26 LAB — TSH: TSH: 5.256 u[IU]/mL — ABNORMAL HIGH (ref 0.350–4.500)

## 2013-10-26 LAB — HIV ANTIBODY (ROUTINE TESTING W REFLEX): HIV: NONREACTIVE

## 2013-10-28 ENCOUNTER — Other Ambulatory Visit: Payer: Self-pay | Admitting: *Deleted

## 2013-10-28 DIAGNOSIS — E291 Testicular hypofunction: Secondary | ICD-10-CM

## 2013-10-28 LAB — TB SKIN TEST
Induration: 0 mm
TB Skin Test: NEGATIVE

## 2013-10-28 LAB — HEPATITIS B E ANTIBODY: Hepatitis Be Antibody: NONREACTIVE

## 2013-10-28 MED ORDER — TADALAFIL 5 MG PO TABS: 5.0000 mg | ORAL_TABLET | Freq: Every day | ORAL | Status: DC | PRN

## 2014-02-03 ENCOUNTER — Other Ambulatory Visit: Payer: Self-pay | Admitting: Internal Medicine

## 2014-02-03 DIAGNOSIS — R7303 Prediabetes: Secondary | ICD-10-CM

## 2014-02-07 ENCOUNTER — Ambulatory Visit (INDEPENDENT_AMBULATORY_CARE_PROVIDER_SITE_OTHER): Payer: 59 | Admitting: Physician Assistant

## 2014-02-07 ENCOUNTER — Encounter: Payer: Self-pay | Admitting: Physician Assistant

## 2014-02-07 VITALS — BP 136/78 | HR 94 | Temp 98.4°F | Resp 16 | Ht 69.0 in | Wt 194.0 lb

## 2014-02-07 DIAGNOSIS — E785 Hyperlipidemia, unspecified: Secondary | ICD-10-CM

## 2014-02-07 DIAGNOSIS — Z79899 Other long term (current) drug therapy: Secondary | ICD-10-CM

## 2014-02-07 DIAGNOSIS — E559 Vitamin D deficiency, unspecified: Secondary | ICD-10-CM

## 2014-02-07 DIAGNOSIS — I10 Essential (primary) hypertension: Secondary | ICD-10-CM

## 2014-02-07 DIAGNOSIS — E291 Testicular hypofunction: Secondary | ICD-10-CM

## 2014-02-07 LAB — LIPID PANEL
Cholesterol: 136 mg/dL (ref 0–200)
HDL: 32 mg/dL — ABNORMAL LOW (ref >39)
LDL CALC: 56 mg/dL (ref 0–99)
TRIGLYCERIDES: 238 mg/dL — AB (ref <150)
Total CHOL/HDL Ratio: 4.3 Ratio
VLDL: 48 mg/dL — ABNORMAL HIGH (ref 0–40)

## 2014-02-07 LAB — HEPATIC FUNCTION PANEL
ALK PHOS: 58 U/L (ref 39–117)
ALT: 51 U/L (ref 0–53)
AST: 36 U/L (ref 0–37)
Albumin: 4.3 g/dL (ref 3.5–5.2)
BILIRUBIN INDIRECT: 0.3 mg/dL (ref 0.2–1.2)
BILIRUBIN TOTAL: 0.4 mg/dL (ref 0.2–1.2)
Bilirubin, Direct: 0.1 mg/dL (ref 0.0–0.3)
Total Protein: 6.8 g/dL (ref 6.0–8.3)

## 2014-02-07 LAB — CBC WITH DIFFERENTIAL/PLATELET
BASOS PCT: 1 % (ref 0–1)
Basophils Absolute: 0.1 10*3/uL (ref 0.0–0.1)
Eosinophils Absolute: 0.4 10*3/uL (ref 0.0–0.7)
Eosinophils Relative: 6 % — ABNORMAL HIGH (ref 0–5)
HCT: 47.6 % (ref 39.0–52.0)
HEMOGLOBIN: 16.2 g/dL (ref 13.0–17.0)
Lymphocytes Relative: 23 % (ref 12–46)
Lymphs Abs: 1.6 10*3/uL (ref 0.7–4.0)
MCH: 27.6 pg (ref 26.0–34.0)
MCHC: 34 g/dL (ref 30.0–36.0)
MCV: 81 fL (ref 78.0–100.0)
MONOS PCT: 6 % (ref 3–12)
Monocytes Absolute: 0.4 10*3/uL (ref 0.1–1.0)
NEUTROS PCT: 64 % (ref 43–77)
Neutro Abs: 4.5 10*3/uL (ref 1.7–7.7)
Platelets: 252 10*3/uL (ref 150–400)
RBC: 5.88 MIL/uL — ABNORMAL HIGH (ref 4.22–5.81)
RDW: 14.5 % (ref 11.5–15.5)
WBC: 7 10*3/uL (ref 4.0–10.5)

## 2014-02-07 LAB — BASIC METABOLIC PANEL WITH GFR
BUN: 17 mg/dL (ref 6–23)
CO2: 28 mEq/L (ref 19–32)
Calcium: 9.4 mg/dL (ref 8.4–10.5)
Chloride: 103 mEq/L (ref 96–112)
Creat: 1.01 mg/dL (ref 0.50–1.35)
GFR, Est Non African American: 85 mL/min
GLUCOSE: 70 mg/dL (ref 70–99)
Potassium: 4.1 mEq/L (ref 3.5–5.3)
Sodium: 140 mEq/L (ref 135–145)

## 2014-02-07 LAB — MAGNESIUM: MAGNESIUM: 1.9 mg/dL (ref 1.5–2.5)

## 2014-02-07 LAB — TSH: TSH: 3.298 u[IU]/mL (ref 0.350–4.500)

## 2014-02-07 LAB — TESTOSTERONE: Testosterone: 284 ng/dL — ABNORMAL LOW (ref 300–890)

## 2014-02-07 LAB — VITAMIN D 25 HYDROXY (VIT D DEFICIENCY, FRACTURES): Vit D, 25-Hydroxy: 93 ng/mL — ABNORMAL HIGH (ref 30–89)

## 2014-02-07 NOTE — Progress Notes (Signed)
Assessment and Plan:  Hypertension: Continue medication, monitor blood pressure at home. Continue DASH diet.  Reminder to go to the ER if any CP, SOB, nausea, dizziness, severe HA, changes vision/speech, left arm numbness and tingling, and jaw pain. Cholesterol: Continue diet and exercise. Check cholesterol.  Pre-diabetes-Continue diet and exercise. Check A1C Vitamin D Def- check level and continue medications.  Hypogonadism- continue replacement therapy, check testosterone levels as needed.    Continue diet and meds as discussed. Further disposition pending results of labs.  HPI 53 y.o. male  presents for 3 month follow up with hypertension, hyperlipidemia, prediabetes and vitamin D. His blood pressure has been controlled at home, today their BP is BP: 136/78 mmHg He does not workout the last couple of months, has not had time due to work. He denies chest pain, shortness of breath, dizziness.  He is on cholesterol medication and denies myalgias. His cholesterol is at goal. The cholesterol last visit was:   Lab Results  Component Value Date   CHOL 124 10/25/2013   HDL 30* 10/25/2013   LDLCALC 52 10/25/2013   TRIG 208* 10/25/2013   CHOLHDL 4.1 10/25/2013    Last A1C in the office was:  Lab Results  Component Value Date   HGBA1C 5.4 10/25/2013   Patient is on Vitamin D supplement.   Lab Results  Component Value Date   VD25OH 98* 10/25/2013     He has a history of testosterone deficiency and is on testosterone replacement, he is on testosterone shots, last one was 2 weeks ago tomorrow, does 1.5 cc q 2 weeks. He states that the testosterone helps with his energy, libido, muscle mass. Lab Results  Component Value Date   TESTOSTERONE 292* 10/25/2013   Right handed, intermittent right handed numbness/tingling on radial side, digits 1-3, worse with picking up objects/using wrench, history of fracture/surgery of MCP.   Current Medications:  Current Outpatient Prescriptions on File Prior to  Visit  Medication Sig Dispense Refill  . butalbital-acetaminophen-caffeine (FIORICET WITH CODEINE) 50-325-40-30 MG per capsule TAKE 1-2 CAPSULES BY MOUTH 4 TIMES DAILY AS NEEDED FOR HEADACHES  50 capsule  0  . celecoxib (CELEBREX) 200 MG capsule as needed.       . cetirizine (ZYRTEC) 10 MG tablet Take 10 mg by mouth daily.      Marland Kitchen esomeprazole (NEXIUM) 20 MG capsule Take 20 mg by mouth daily at 12 noon. OTC      . KRILL OIL PO Take by mouth daily.      Marland Kitchen lisinopril (PRINIVIL,ZESTRIL) 20 MG tablet Take 1 tablet (20 mg total) by mouth daily.  90 tablet  3  . pregabalin (LYRICA) 75 MG capsule Take 1 capsule (75 mg total) by mouth 2 (two) times daily.  180 capsule  3  . rosuvastatin (CRESTOR) 5 MG tablet Take 1 tablet (5 mg total) by mouth daily. For Cholesterol  90 tablet  3  . SYRINGE-NEEDLE, DISP, 3 ML (B-D 3CC LUER-LOK SYR 22GX1") 22G X 1" 3 ML MISC Checks sugar 1 daily for fluctuating sugars for prediabetes  50 each  3  . tadalafil (CIALIS) 5 MG tablet Take 1 tablet (5 mg total) by mouth daily as needed for erectile dysfunction.  30 tablet  11  . testosterone cypionate (DEPOTESTOTERONE CYPIONATE) 200 MG/ML injection USE AS DIRECTED  10 mL  3  . traMADol (ULTRAM) 50 MG tablet TAKE 1 TO 2 TABLETS BY MOUTH UP TO 4 TIMES A DAY AS NEEDED FOR PAIN  60 tablet  2  . Triamcinolone Acetonide (NASACORT AQ NA) Place into the nose. Uses PRN- OTC       No current facility-administered medications on file prior to visit.   Medical History:  Past Medical History  Diagnosis Date  . Hyperlipidemia   . Hypertension   . Allergy   . DDD (degenerative disc disease)   . OSA (obstructive sleep apnea)   . Other testicular hypofunction    Allergies: No Known Allergies   Review of Systems: [X]  = complains of  [ ]  = denies  General: Fatigue [ ]  Fever [ ]  Chills [ ]  Weakness [ ]   Insomnia [ ]  Eyes: Redness [ ]  Blurred vision [ ]  Diplopia [ ]   ENT: Congestion [ ]  Sinus Pain [ ]  Post Nasal Drip [ ]  Sore Throat [  ] Earache [ ]   Cardiac: Chest pain/pressure [ ]  SOB [ ]  Orthopnea [ ]   Palpitations [ ]   Paroxysmal nocturnal dyspnea[ ]  Claudication [ ]  Edema [ ]   Pulmonary: Cough [ ]  Wheezing[ ]   SOB [ ]   Snoring [ ]   GI: Nausea [ ]  Vomiting[ ]  Dysphagia[ ]  Heartburn[ ]  Abdominal pain [ ]  Constipation [ ] ; Diarrhea [ ] ; BRBPR [ ]  Melena[ ]  GU: Hematuria[ ]  Dysuria [ ]  Nocturia[ ]  Urgency [ ]   Hesitancy [ ]  Discharge [ ]  Neuro: Headaches[ ]  Vertigo[ ]  Paresthesias[ ]  Spasm [ ]  Speech changes [ ]  Incoordination [ ]   Ortho: Arthritis [ ]  Joint pain [ ]  Muscle pain [ ]  Joint swelling [ ]  Back Pain [ ]  Skin:  Rash [ ]   Pruritis [ ]  Change in skin lesion [ ]   Psych: Depression[ ]  Anxiety[ ]  Confusion [ ]  Memory loss [ ]   Heme/Lypmh: Bleeding [ ]  Bruising [ ]  Enlarged lymph nodes [ ]   Endocrine: Visual blurring [ ]  Paresthesia [ ]  Polyuria [ ]  Polydypsea [ ]    Heat/cold intolerance [ ]  Hypoglycemia [ ]   Family history- Review and unchanged Social history- Review and unchanged Physical Exam: BP 136/78  Pulse 94  Temp(Src) 98.4 F (36.9 C) (Temporal)  Resp 16  Ht 5\' 9"  (1.753 m)  Wt 194 lb (87.998 kg)  BMI 28.64 kg/m2 Wt Readings from Last 3 Encounters:  02/07/14 194 lb (87.998 kg)  10/25/13 192 lb 9.6 oz (87.363 kg)  09/10/13 195 lb 3.2 oz (88.542 kg)   General Appearance: Well nourished, in no apparent distress. Eyes: PERRLA, EOMs, conjunctiva no swelling or erythema Sinuses: No Frontal/maxillary tenderness ENT/Mouth: Ext aud canals clear, TMs without erythema, bulging. No erythema, swelling, or exudate on post pharynx.  Tonsils not swollen or erythematous. Hearing normal.  Neck: Supple, thyroid normal.  Respiratory: Respiratory effort normal, BS equal bilaterally without rales, rhonchi, wheezing or stridor.  Cardio: RRR with no MRGs. Brisk peripheral pulses without edema.  Abdomen: Soft, + BS.  Non tender, no guarding, rebound, hernias, masses. Lymphatics: Non tender without lymphadenopathy.   Musculoskeletal: Full ROM, 5/5 strength, normal gait.  Skin: Warm, dry without rashes, lesions, ecchymosis.  Neuro: Cranial nerves intact. Normal muscle tone, no cerebellar symptoms. Sensation intact.  Psych: Awake and oriented X 3, normal affect, Insight and Judgment appropriate.    Vicie Mutters, PA-C 9:01 AM [facility]

## 2014-02-11 ENCOUNTER — Encounter: Payer: Self-pay | Admitting: Physician Assistant

## 2014-02-11 MED ORDER — TESTOSTERONE CYPIONATE 200 MG/ML IM SOLN: INTRAMUSCULAR | Status: DC

## 2014-03-04 ENCOUNTER — Other Ambulatory Visit: Payer: Self-pay | Admitting: Physician Assistant

## 2014-04-29 ENCOUNTER — Other Ambulatory Visit: Payer: Self-pay | Admitting: Physician Assistant

## 2014-04-29 DIAGNOSIS — E349 Endocrine disorder, unspecified: Secondary | ICD-10-CM

## 2014-05-11 DIAGNOSIS — E8881 Metabolic syndrome: Secondary | ICD-10-CM | POA: Insufficient documentation

## 2014-05-11 NOTE — Patient Instructions (Signed)

## 2014-05-11 NOTE — Progress Notes (Signed)
Patient ID: Oscar Moore, male   DOB: 1960/05/23, 54 y.o.   MRN: [id number]   This very nice 54 y.o.male presents for 6 month follow up with Hypertension, Hyperlipidemia, Pre-Diabetes, Testostrone and Vitamin D Deficiency.    Patient is treated for HTN & BP has been controlled at home. Today's BP: 122/80 mmHg. Patient has had no complaints of any cardiac type chest pain, palpitations, dyspnea/orthopnea/PND, dizziness, claudication, or dependent edema.   Hyperlipidemia is controlled with diet & meds. Patient denies myalgias or other med SE's. Last Lipids were at goal - Total Chol 136; HDL 32; LDL  56;with elevated Triglycerides 238 on 02/07/2014.   Also, the patient has history of Insulin Resistance & PreDiabetes and has had no symptoms of reactive hypoglycemia, diabetic polys, paresthesias or visual blurring. Last A1c was  5.4 and insulin elevated at 45 (Nl <20) on 10/25/2013.   Patient is on Testosterone Replacement by injection with improved sense o well-being.Further, the patient also has history of Vitamin D Deficiency and supplements vitamin D without any suspected side-effects. Last vitamin D was  93 on  02/07/2014.  Medication Sig  . butalbital-acetaminophen-caffeine (FIORICET WITH CODEINE) 50-325-40-30 MG per capsule TAKE 1-2 CAPSULES BY MOUTH 4 TIMES DAILY AS NEEDED FOR HEADACHES  . celecoxib (CELEBREX) 200 MG capsule as needed.   . cetirizine (ZYRTEC) 10 MG tablet Take 10 mg by mouth daily.  Marland Kitchen esomeprazole (NEXIUM) 20 MG capsule Take 20 mg by mouth daily at 12 noon. OTC  . KRILL OIL PO Take by mouth daily.  Marland Kitchen lisinopril (PRINIVIL,ZESTRIL) 20 MG tablet Take 1 tablet (20 mg total) by mouth daily.  . pregabalin (LYRICA) 75 MG capsule Take 1 capsule (75 mg total) by mouth 2 (two) times daily.  . rosuvastatin (CRESTOR) 5 MG tablet Take 1 tablet (5 mg total) by mouth daily. For Cholesterol  . tadalafil (CIALIS) 5 MG tablet Take 1 tablet (5 mg total) by mouth daily as needed for erectile  dysfunction.  Marland Kitchen testosterone cypionate (DEPOTESTOTERONE CYPIONATE) 200 MG/ML injection INJECT 2CC INTRAMUSCULARLY EVERY 2 WEEKS OR AS DIRECTED  . traMADol (ULTRAM) 50 MG tablet TAKE 1 TO 2 TABLETS UP TO 4 TIMES A DAY  . Triamcinolone Acetonide (NASACORT AQ NA) Place into the nose. Uses PRN- OTC   No Known Allergies  PMHx:   Past Medical History  Diagnosis Date  . Hyperlipidemia   . Hypertension   . Allergy   . DDD (degenerative disc disease)   . OSA (obstructive sleep apnea)   . Other testicular hypofunction    Immunization History  Administered Date(s) Administered  . PPD Test 10/25/2013  . Tdap 10/25/2013   Past Surgical History  Procedure Laterality Date  . Appendectomy  1983  . Hand surgery Right 2007   FHx:    Reviewed / unchanged  SHx:    Reviewed / unchanged  Systems Review:  Constitutional: Denies fever, chills, wt changes, headaches, insomnia, fatigue, night sweats, change in appetite. Eyes: Denies redness, blurred vision, diplopia, discharge, itchy, watery eyes.  ENT: Denies discharge, congestion, post nasal drip, epistaxis, sore throat, earache, hearing loss, dental pain, tinnitus, vertigo, sinus pain, snoring.  CV: Denies chest pain, palpitations, irregular heartbeat, syncope, dyspnea, diaphoresis, orthopnea, PND, claudication or edema. Respiratory: denies cough, dyspnea, DOE, pleurisy, hoarseness, laryngitis, wheezing.  Gastrointestinal: Denies dysphagia, odynophagia, heartburn, reflux, water brash, abdominal pain or cramps, nausea, vomiting, bloating, diarrhea, constipation, hematemesis, melena, hematochezia  or hemorrhoids. Genitourinary: Denies dysuria, frequency, urgency, nocturia, hesitancy, discharge, hematuria or flank pain.  Musculoskeletal: Denies arthralgias, myalgias, stiffness, jt. swelling, pain, limping or strain/sprain.  Skin: Denies pruritus, rash, hives, warts, acne, eczema or change in skin lesion(s). Neuro: No weakness, tremor, incoordination,  spasms, paresthesia or pain. Psychiatric: Denies confusion, memory loss or sensory loss. Endo: Denies change in weight, skin or hair change.  Heme/Lymph: No excessive bleeding, bruising or enlarged lymph nodes.  Physical Exam  BP 122/80  Pulse 84  Temp 98.4 F   Resp 16  Ht 5\' 9"    Wt 197 lb    BMI 29.08   Appears well nourished and in no distress. Eyes: PERRLA, EOMs, conjunctiva no swelling or erythema. Sinuses: No frontal/maxillary tenderness ENT/Mouth: EAC's clear, TM's nl w/o erythema, bulging. Nares clear w/o erythema, swelling, exudates. Oropharynx clear without erythema or exudates. Oral hygiene is good. Tongue normal, non obstructing. Hearing intact.  Neck: Supple. Thyroid nl. Car 2+/2+ without bruits, nodes or JVD. Chest: Respirations nl with BS clear & equal w/o rales, rhonchi, wheezing or stridor.  Cor: Heart sounds normal w/ regular rate and rhythm without sig. murmurs, gallops, clicks, or rubs. Peripheral pulses normal and equal  without edema.  Abdomen: Soft & bowel sounds normal. Non-tender w/o guarding, rebound, hernias, masses, or organomegaly.  Lymphatics: Unremarkable.  Musculoskeletal: Full ROM all peripheral extremities, joint stability, 5/5 strength, and normal gait.  Skin: Warm, dry without exposed rashes, lesions or ecchymosis apparent.  Neuro: Cranial nerves intact, reflexes equal bilaterally. Sensory-motor testing grossly intact. Tendon reflexes grossly intact.  Pysch: Alert & oriented x 3.  Insight and judgement nl & appropriate. No ideations.  Assessment and Plan:  1. Hypertension - Continue monitor blood pressure at home. Continue diet/meds same.  2. Hyperlipidemia - Continue diet/meds, exercise,& lifestyle modifications. Continue monitor periodic cholesterol/liver & renal functions   3. Insulin Resistance / Pre-Diabetes - Continue diet, exercise, lifestyle modifications. Monitor appropriate labs.  4. Vitamin D Deficiency - Continue  supplementation.  5. Testerone Deficiency -    Recommended regular exercise, BP monitoring, weight control, and discussed med and SE's. Recommended labs to assess and monitor clinical status. Further disposition pending results of labs.

## 2014-05-12 ENCOUNTER — Ambulatory Visit (INDEPENDENT_AMBULATORY_CARE_PROVIDER_SITE_OTHER): Payer: 59 | Admitting: Internal Medicine

## 2014-05-12 ENCOUNTER — Encounter: Payer: Self-pay | Admitting: Internal Medicine

## 2014-05-12 VITALS — BP 122/80 | HR 84 | Temp 98.4°F | Resp 16 | Ht 69.0 in | Wt 197.0 lb

## 2014-05-12 DIAGNOSIS — Z79899 Other long term (current) drug therapy: Secondary | ICD-10-CM

## 2014-05-12 DIAGNOSIS — E559 Vitamin D deficiency, unspecified: Secondary | ICD-10-CM

## 2014-05-12 DIAGNOSIS — I10 Essential (primary) hypertension: Secondary | ICD-10-CM

## 2014-05-12 DIAGNOSIS — G4733 Obstructive sleep apnea (adult) (pediatric): Secondary | ICD-10-CM

## 2014-05-12 DIAGNOSIS — E8881 Metabolic syndrome: Secondary | ICD-10-CM

## 2014-05-12 DIAGNOSIS — E785 Hyperlipidemia, unspecified: Secondary | ICD-10-CM

## 2014-05-12 DIAGNOSIS — E349 Endocrine disorder, unspecified: Secondary | ICD-10-CM

## 2014-05-12 LAB — CBC WITH DIFFERENTIAL/PLATELET
BASOS ABS: 0.1 10*3/uL (ref 0.0–0.1)
BASOS PCT: 1 % (ref 0–1)
Eosinophils Absolute: 0.6 10*3/uL (ref 0.0–0.7)
Eosinophils Relative: 8 % — ABNORMAL HIGH (ref 0–5)
HEMATOCRIT: 46.7 % (ref 39.0–52.0)
Hemoglobin: 15.7 g/dL (ref 13.0–17.0)
LYMPHS ABS: 1.5 10*3/uL (ref 0.7–4.0)
LYMPHS PCT: 21 % (ref 12–46)
MCH: 26.6 pg (ref 26.0–34.0)
MCHC: 33.6 g/dL (ref 30.0–36.0)
MCV: 79.2 fL (ref 78.0–100.0)
MPV: 9.7 fL (ref 8.6–12.4)
Monocytes Absolute: 0.3 10*3/uL (ref 0.1–1.0)
Monocytes Relative: 4 % (ref 3–12)
NEUTROS PCT: 66 % (ref 43–77)
Neutro Abs: 4.6 10*3/uL (ref 1.7–7.7)
Platelets: 234 10*3/uL (ref 150–400)
RBC: 5.9 MIL/uL — ABNORMAL HIGH (ref 4.22–5.81)
RDW: 14.4 % (ref 11.5–15.5)
WBC: 7 10*3/uL (ref 4.0–10.5)

## 2014-05-12 LAB — BASIC METABOLIC PANEL WITH GFR
BUN: 15 mg/dL (ref 6–23)
CO2: 27 mEq/L (ref 19–32)
Calcium: 9.3 mg/dL (ref 8.4–10.5)
Chloride: 103 mEq/L (ref 96–112)
Creat: 0.93 mg/dL (ref 0.50–1.35)
GFR, Est African American: >89 mL/min
GFR, Est Non African American: >89 mL/min
GLUCOSE: 108 mg/dL — AB (ref 70–99)
Potassium: 4.1 mEq/L (ref 3.5–5.3)
SODIUM: 139 meq/L (ref 135–145)

## 2014-05-12 LAB — TSH: TSH: 4.055 u[IU]/mL (ref 0.350–4.500)

## 2014-05-12 LAB — HEPATIC FUNCTION PANEL
ALBUMIN: 3.9 g/dL (ref 3.5–5.2)
ALK PHOS: 49 U/L (ref 39–117)
ALT: 47 U/L (ref 0–53)
AST: 33 U/L (ref 0–37)
BILIRUBIN DIRECT: 0.1 mg/dL (ref 0.0–0.3)
Indirect Bilirubin: 0.3 mg/dL (ref 0.2–1.2)
Total Bilirubin: 0.4 mg/dL (ref 0.2–1.2)
Total Protein: 6.3 g/dL (ref 6.0–8.3)

## 2014-05-12 LAB — LIPID PANEL
CHOLESTEROL: 125 mg/dL (ref 0–200)
HDL: 32 mg/dL — AB (ref >39)
LDL Cholesterol: 53 mg/dL (ref 0–99)
Total CHOL/HDL Ratio: 3.9 Ratio
Triglycerides: 200 mg/dL — ABNORMAL HIGH (ref <150)
VLDL: 40 mg/dL (ref 0–40)

## 2014-05-12 LAB — TESTOSTERONE: TESTOSTERONE: 489 ng/dL (ref 300–890)

## 2014-05-12 LAB — HEMOGLOBIN A1C
Hgb A1c MFr Bld: 5.6 % (ref <5.7)
Mean Plasma Glucose: 114 mg/dL (ref <117)

## 2014-05-12 LAB — MAGNESIUM: MAGNESIUM: 2 mg/dL (ref 1.5–2.5)

## 2014-05-12 LAB — VITAMIN D 25 HYDROXY (VIT D DEFICIENCY, FRACTURES): VIT D 25 HYDROXY: 85 ng/mL (ref 30–100)

## 2014-05-13 LAB — INSULIN, FASTING: Insulin fasting, serum: 32.5 u[IU]/mL — ABNORMAL HIGH (ref 2.0–19.6)

## 2014-07-02 ENCOUNTER — Other Ambulatory Visit: Payer: Self-pay

## 2014-07-02 MED ORDER — PREGABALIN 75 MG PO CAPS: 75.0000 mg | ORAL_CAPSULE | Freq: Two times a day (BID) | ORAL | Status: DC

## 2014-07-15 ENCOUNTER — Other Ambulatory Visit: Payer: Self-pay | Admitting: Internal Medicine

## 2014-07-15 MED ORDER — BUTALBITAL-APAP-CAFF-COD 50-325-40-30 MG PO CAPS: ORAL_CAPSULE | ORAL | Status: DC

## 2014-07-16 ENCOUNTER — Other Ambulatory Visit: Payer: Self-pay

## 2014-07-16 MED ORDER — BUTALBITAL-APAP-CAFFEINE 50-300-40 MG PO CAPS: ORAL_CAPSULE | ORAL | Status: DC

## 2014-08-29 ENCOUNTER — Encounter: Payer: Self-pay | Admitting: Physician Assistant

## 2014-08-29 ENCOUNTER — Ambulatory Visit (INDEPENDENT_AMBULATORY_CARE_PROVIDER_SITE_OTHER): Payer: 59 | Admitting: Physician Assistant

## 2014-08-29 VITALS — BP 128/82 | HR 76 | Temp 98.2°F | Resp 16 | Ht 69.0 in | Wt 193.0 lb

## 2014-08-29 DIAGNOSIS — Z79899 Other long term (current) drug therapy: Secondary | ICD-10-CM

## 2014-08-29 DIAGNOSIS — E349 Endocrine disorder, unspecified: Secondary | ICD-10-CM

## 2014-08-29 DIAGNOSIS — E8881 Metabolic syndrome: Secondary | ICD-10-CM

## 2014-08-29 DIAGNOSIS — E291 Testicular hypofunction: Secondary | ICD-10-CM

## 2014-08-29 DIAGNOSIS — E785 Hyperlipidemia, unspecified: Secondary | ICD-10-CM

## 2014-08-29 DIAGNOSIS — I10 Essential (primary) hypertension: Secondary | ICD-10-CM

## 2014-08-29 DIAGNOSIS — E559 Vitamin D deficiency, unspecified: Secondary | ICD-10-CM

## 2014-08-29 LAB — CBC WITH DIFFERENTIAL/PLATELET
Basophils Absolute: 0.1 10*3/uL (ref 0.0–0.1)
Basophils Relative: 2 % — ABNORMAL HIGH (ref 0–1)
EOS ABS: 0.6 10*3/uL (ref 0.0–0.7)
Eosinophils Relative: 8 % — ABNORMAL HIGH (ref 0–5)
HCT: 46.4 % (ref 39.0–52.0)
HEMOGLOBIN: 15.2 g/dL (ref 13.0–17.0)
LYMPHS ABS: 1.3 10*3/uL (ref 0.7–4.0)
LYMPHS PCT: 17 % (ref 12–46)
MCH: 25.7 pg — AB (ref 26.0–34.0)
MCHC: 32.8 g/dL (ref 30.0–36.0)
MCV: 78.5 fL (ref 78.0–100.0)
MPV: 9.3 fL (ref 8.6–12.4)
Monocytes Absolute: 0.5 10*3/uL (ref 0.1–1.0)
Monocytes Relative: 7 % (ref 3–12)
Neutro Abs: 4.9 10*3/uL (ref 1.7–7.7)
Neutrophils Relative %: 66 % (ref 43–77)
Platelets: 272 10*3/uL (ref 150–400)
RBC: 5.91 MIL/uL — AB (ref 4.22–5.81)
RDW: 14.6 % (ref 11.5–15.5)
WBC: 7.4 10*3/uL (ref 4.0–10.5)

## 2014-08-29 NOTE — Patient Instructions (Addendum)
9 Ways to Naturally Increase Testosterone Levels  1.   Lose Weight If you're overweight, shedding the excess pounds may increase your testosterone levels, according to research presented at the [employer]ocrine Society's 2012 meeting. Overweight men are more likely to have low testosterone levels to begin with, so this is an important trick to increase your body's testosterone production when you need it most.  2.   High-Intensity Exercise like Peak Fitness  Short intense exercise has a proven positive effect on increasing testosterone levels and preventing its decline. That's unlike aerobics or prolonged moderate exercise, which have shown to have negative or no effect on testosterone levels. Having a whey protein meal after exercise can further enhance the satiety/testosterone-boosting impact (hunger hormones cause the opposite effect on your testosterone and libido). Here's a summary of what a typical high-intensity Peak Fitness routine might look like: " Warm up for three minutes  " Exercise as hard and fast as you can for 30 seconds. You should feel like you couldn't possibly go on another few seconds  " Recover at a slow to moderate pace for 90 seconds  " Repeat the high intensity exercise and recovery 7 more times .  3.   Consume Plenty of Zinc The mineral zinc is important for testosterone production, and supplementing your diet for as little as six weeks has been shown to cause a marked improvement in testosterone among men with low levels.1 Likewise, research has shown that restricting dietary sources of zinc leads to a significant decrease in testosterone, while zinc supplementation increases it2 -- and even protects men from exercised-induced reductions in testosterone levels.3 It's estimated that up to 45 percent of adults over the age of 60 may have lower than recommended zinc intakes; even when dietary supplements were added in, an estimated 20-25 percent of older adults still had inadequate  zinc intakes, according to a National Health and Nutrition Examination Survey.4 Your diet is the best source of zinc; along with protein-rich foods like meats and fish, other good dietary sources of zinc include raw milk, raw cheese, beans, and yogurt or kefir made from raw milk. It can be difficult to obtain enough dietary zinc if you're a vegetarian, and also for meat-eaters as well, largely because of conventional farming methods that rely heavily on chemical fertilizers and pesticides. These chemicals deplete the soil of nutrients ... nutrients like zinc that must be absorbed by plants in order to be passed on to you. In many cases, you may further deplete the nutrients in your food by the way you prepare it. For most food, cooking it will drastically reduce its levels of nutrients like zinc . particularly over-cooking, which many people do. If you decide to use a zinc supplement, stick to a dosage of less than 40 mg a day, as this is the recommended adult upper limit. Taking too much zinc can interfere with your body's ability to absorb other minerals, especially copper, and may cause nausea as a side effect.  4.   Strength Training In addition to Peak Fitness, strength training is also known to boost testosterone levels, provided you are doing so intensely enough. When strength training to boost testosterone, you'll want to increase the weight and lower your number of reps, and then focus on exercises that work a large number of muscles, such as dead lifts or squats.  You can "turbo-charge" your weight training by going slower. By slowing down your movement, you're actually turning it into a high-intensity exercise. Super Slow   movement allows your muscle, at the microscopic level, to access the maximum number of cross-bridges between the protein filaments that produce movement in the muscle.   5.   Optimize Your Vitamin D Levels Vitamin D, a steroid hormone, is essential for the healthy development  of the nucleus of the sperm cell, and helps maintain semen quality and sperm count. Vitamin D also increases levels of testosterone, which may boost libido. In one study, overweight men who were given vitamin D supplements had a significant increase in testosterone levels after one year.5   6.   Reduce Stress When you're under a lot of stress, your body releases high levels of the stress hormone cortisol. This hormone actually blocks the effects of testosterone,6 presumably because, from a biological standpoint, testosterone-associated behaviors (mating, competing, aggression) may have lowered your chances of survival in an emergency (hence, the "fight or flight" response is dominant, courtesy of cortisol).  7.   Limit or Eliminate Sugar from Your Diet Testosterone levels decrease after you eat sugar, which is likely because the sugar leads to a high insulin level, another factor leading to low testosterone.7 Based on USDA estimates, the average American consumes 12 teaspoons of sugar a day, which equates to about TWO TONS of sugar during a lifetime.  8.   Eat Healthy Fats By healthy, this means not only mon- and polyunsaturated fats, like that found in avocadoes and nuts, but also saturated, as these are essential for building testosterone. Research shows that a diet with less than 40 percent of energy as fat (and that mainly from animal sources, i.e. saturated) lead to a decrease in testosterone levels.8 My personal diet is about 60-70 percent healthy fat, and other experts agree that the ideal diet includes somewhere between 50-70 percent fat.  It's important to understand that your body requires saturated fats from animal and vegetable sources (such as meat, dairy, certain oils, and tropical plants like coconut) for optimal functioning, and if you neglect this important food group in favor of sugar, grains and other starchy carbs, your health and weight are almost guaranteed to suffer. Examples of  healthy fats you can eat more of to give your testosterone levels a boost include: Olives and Olive oil  Coconuts and coconut oil Butter made from raw grass-fed organic milk Raw nuts, such as, almonds or pecans Organic pastured egg yolks Avocados Grass-fed meats Palm oil Unheated organic nut oils   9.   Boost Your Intake of Branch Chain Amino Acids (BCAA) from Foods Like [place] suggests that BCAAs result in higher testosterone levels, particularly when taken along with resistance training.9 While BCAAs are available in supplement form, you'll find the highest concentrations of BCAAs like leucine in dairy products - especially quality cheeses and whey protein. Even when getting leucine from your natural food supply, it's often wasted or used as a building block instead of an anabolic agent. So to create the correct anabolic environment, you need to boost leucine consumption way beyond mere maintenance levels. That said, keep in mind that using leucine as a free form amino acid can be highly counterproductive as when free form amino acids are artificially administrated, they rapidly enter your circulation while disrupting insulin function, and impairing your body's glycemic control. Food-based leucine is really the ideal form that can benefit your muscles without side effects.   Before you even begin to attack a weight-loss plan, it pays to remember this: You are not fat. You have fat. Losing weight isn't  about blame or shame; it's simply another achievement to accomplish. Dieting is like any other skill-you have to buckle down and work at it. As long as you act in a smart, reasonable way, you'll ultimately get where you want to be. Here are some weight loss pearls for you.  1. It's Not a Diet. It's a Lifestyle Thinking of a diet as something you're on and suffering through only for the short term doesn't work. To shed weight and keep it off, you need to make permanent changes to the way you  eat. It's OK to indulge occasionally, of course, but if you cut calories temporarily and then revert to your old way of eating, you'll gain back the weight quicker than you can say yo-yo. Use it to lose it. Research shows that one of the best predictors of long-term weight loss is how many pounds you drop in the first month. For that reason, nutritionists often suggest being stricter for the first two weeks of your new eating strategy to build momentum. Cut out added sugar and alcohol and avoid unrefined carbs. After that, figure out how you can reincorporate them in a way that's healthy and maintainable.  2. There's a Right Way to Exercise Working out burns calories and fat and boosts your metabolism by building muscle. But those trying to lose weight are notorious for overestimating the number of calories they burn and underestimating the amount they take in. Unfortunately, your system is biologically programmed to hold on to extra pounds and that means when you start exercising, your body senses the deficit and ramps up its hunger signals. If you're not diligent, you'll eat everything you burn and then some. Use it to lose it. Cardio gets all the exercise glory, but strength and interval training are the real heroes. They help you build lean muscle, which in turn increases your metabolism and calorie-burning ability 3. Don't Overreact to Mild Hunger Some people have a hard time losing weight because of hunger anxiety. To them, being hungry is bad-something to be avoided at all costs-so they carry snacks with them and eat when they don't need to. Others eat because they're stressed out or bored. While you never want to get to the point of being ravenous (that's when bingeing is likely to happen), a hunger pang, a craving, or the fact that it's 3:00 p.m. should not send you racing for the vending machine or obsessing about the energy bar in your purse. Ideally, you should put off eating until your stomach is  growling and it's difficult to concentrate.  Use it to lose it. When you feel the urge to eat, use the HALT method. Ask yourself, Am I really hungry? Or am I angry or anxious, lonely or bored, or tired? If you're still not certain, try the apple test. If you're truly hungry, an apple should seem delicious; if it doesn't, something else is going on. Or you can try drinking water and making yourself busy, if you are still hungry try a healthy snack.  4. Not All Calories Are Created Equal The mechanics of weight loss are pretty simple: Take in fewer calories than you use for energy. But the kind of food you eat makes all the difference. Processed food that's high in saturated fat and refined starch or sugar can cause inflammation that disrupts the hormone signals that tell your brain you're full. The result: You eat a lot more.  Use it to lose it. Clean up your diet. Swap in  whole, unprocessed foods, including vegetables, lean protein, and healthy fats that will fill you up and give you the biggest nutritional bang for your calorie buck. In a few weeks, as your brain starts receiving regular hunger and fullness signals once again, you'll notice that you feel less hungry overall and naturally start cutting back on the amount you eat.  5. Protein, Produce, and Plant-Based Fats Are Your Weight-Loss Trinity Here's why eating the three Ps regularly will help you drop pounds. Protein fills you up. You need it to build lean muscle, which keeps your metabolism humming so that you can torch more fat. People in a weight-loss program who ate double the recommended daily allowance for protein (about 110 grams for a 150-pound woman) lost 70 percent of their weight from fat, while people who ate the RDA lost only about 40 percent, one study found. Produce is packed with filling fiber. "It's very difficult to consume too many calories if you're eating a lot of vegetables. Example: Three cups of broccoli is a lot of food, yet  only 93 calories. (Fruit is another story. It can be easy to overeat and can contain a lot of calories from sugar, so be sure to monitor your intake.) Plant-based fats like olive oil and those in avocados and nuts are healthy and extra satiating.  Use it to lose it. Aim to incorporate each of the three Ps into every meal and snack. People who eat protein throughout the day are able to keep weight off, according to a study in the [place] of Clinical Nutrition. In addition to meat, poultry and seafood, good sources are beans, lentils, eggs, tofu, and yogurt. As for fat, keep portion sizes in check by measuring out salad dressing, oil, and nut butters (shoot for one to two tablespoons). Finally, eat veggies or a little fruit at every meal. People who did that consumed 308 fewer calories but didn't feel any hungrier than when they didn't eat more produce.  7. How You Eat Is As Important As What You Eat In order for your brain to register that you're full, you need to focus on what you're eating. Sit down whenever you eat, preferably at a table. Turn off the TV or computer, put down your phone, and look at your food. Smell it. Chew slowly, and don't put another bite on your fork until you swallow. When women ate lunch this attentively, they consumed 30 percent less when snacking later than those who listened to an audiobook at lunchtime, according to a study in the [place] of Nutrition. 8. Weighing Yourself Really Works The scale provides the best evidence about whether your efforts are paying off. Seeing the numbers tick up or down or stagnate is motivation to keep going-or to rethink your approach. A 2015 study at [facility] found that daily weigh-ins helped people lose more weight, keep it off, and maintain that loss, even after two years. Use it to lose it. Step on the scale at the same time every day for the best results. If your weight shoots up several pounds from one weigh-in to  the next, don't freak out. Eating a lot of salt the night before or having your period is the likely culprit. The number should return to normal in a day or two. It's a steady climb that you need to do something about. 9. Too Much Stress and Too Little Sleep Are Your Enemies When you're tired and frazzled, your body cranks up the production of  cortisol, the stress hormone that can cause carb cravings. Not getting enough sleep also boosts your levels of ghrelin, a hormone associated with hunger, while suppressing leptin, a hormone that signals fullness and satiety. People on a diet who slept only five and a half hours a night for two weeks lost 55 percent less fat and were hungrier than those who slept eight and a half hours, according to a study in the [place]. Use it to lose it. Prioritize sleep, aiming for seven hours or more a night, which research shows helps lower stress. And make sure you're getting quality zzz's. If a snoring spouse or a fidgety cat wakes you up frequently throughout the night, you may end up getting the equivalent of just four hours of sleep, according to a study from [facility]. Keep pets out of the bedroom, and use a white-noise app to drown out snoring. 10. You Will Hit a plateau-And You Can Bust Through It As you slim down, your body releases much less leptin, the fullness hormone.  If you're not strength training, start right now. Building muscle can raise your metabolism to help you overcome a plateau. To keep your body challenged and burning calories, incorporate new moves and more intense intervals into your workouts or add another sweat session to your weekly routine. Alternatively, cut an extra 100 calories or so a day from your diet. Now that you've lost weight, your body simply doesn't need as much fuel.

## 2014-08-29 NOTE — Progress Notes (Signed)
Assessment and Plan:  1. Hypertension -Continue medication, monitor blood pressure at home. Continue DASH diet.  Reminder to go to the ER if any CP, SOB, nausea, dizziness, severe HA, changes vision/speech, left arm numbness and tingling and jaw pain.  2. Cholesterol -Continue diet and exercise. Check cholesterol.   3. Prediabetes  -Continue diet and exercise. Check A1C  4. Vitamin D Def - check level and continue medications.   5. Hypogonadism - continue replacement therapy, check testosterone levels as needed.   Continue diet and meds as discussed. Further disposition pending results of labs. Over 30 minutes of exam, counseling, chart review, and critical decision making was performed  HPI 54 y.o. male  presents for 3 month follow up on hypertension, cholesterol, prediabetes, and vitamin D deficiency.   His blood pressure has been controlled at home, today their BP is BP: 128/82 mmHg  He does workout, started to exercise, 45 mins a day. He denies chest pain, shortness of breath, dizziness.  He is on cholesterol medication and denies myalgias. His cholesterol is at goal. The cholesterol last visit was:   Lab Results  Component Value Date   CHOL 125 05/12/2014   HDL 32* 05/12/2014   LDLCALC 53 05/12/2014   TRIG 200* 05/12/2014   CHOLHDL 3.9 05/12/2014  Last A1C in the office was:  Lab Results  Component Value Date   HGBA1C 5.6 05/12/2014  Patient is on Vitamin D supplement.   Lab Results  Component Value Date   VD25OH 38 05/12/2014  He has a history of testosterone deficiency and is on testosterone replacement, 2 cc q 2 weeks, had 1 week ago.Marland Kitchen He states that the testosterone helps with his energy, libido, muscle mass. Lab Results  Component Value Date   TESTOSTERONE 489 05/12/2014  BMI is Body mass index is 28.49 kg/(m^2)., he is working on diet and exercise. Wt Readings from Last 3 Encounters:  08/29/14 193 lb (87.544 kg)  05/12/14 197 lb (89.359 kg)  02/07/14 194 lb  (87.998 kg)   Has history of lower back pain, has seen Dr. Patrice Paradise in the past, on celebrex and for the past month as been back on lyrica due to increasing pain. Will occ have right sided lateral leg pain to his knee very rare down to his ankle.    Planning on retiring in November which may help his back.   Current Medications:  Current Outpatient Prescriptions on File Prior to Visit  Medication Sig Dispense Refill  . Butalbital-APAP-Caffeine 50-300-40 MG CAPS Take 1-2 capsules every 4  hours as needed for headache not to exceed 8 per day 50 capsule 0  . celecoxib (CELEBREX) 200 MG capsule 200 mg daily.     . cetirizine (ZYRTEC) 10 MG tablet Take 10 mg by mouth daily.    . CRESTOR 5 MG tablet     . esomeprazole (NEXIUM) 20 MG capsule Take 20 mg by mouth daily at 12 noon. OTC    . KRILL OIL PO Take by mouth daily.    Marland Kitchen lisinopril (PRINIVIL,ZESTRIL) 20 MG tablet Take 1 tablet (20 mg total) by mouth daily. 90 tablet 3  . pregabalin (LYRICA) 75 MG capsule Take 1 capsule (75 mg total) by mouth 2 (two) times daily. 180 capsule 3  . SYRINGE-NEEDLE, DISP, 3 ML (B-D 3CC LUER-LOK SYR 22GX1") 22G X 1" 3 ML MISC Checks sugar 1 daily for fluctuating sugars for prediabetes 50 each 3  . tadalafil (CIALIS) 5 MG tablet Take 1 tablet (5 mg  total) by mouth daily as needed for erectile dysfunction. 30 tablet 11  . testosterone cypionate (DEPOTESTOTERONE CYPIONATE) 200 MG/ML injection INJECT 2CC INTRAMUSCULARLY EVERY 2 WEEKS OR AS DIRECTED 10 mL 3  . Triamcinolone Acetonide (NASACORT AQ NA) Place into the nose. Uses PRN- OTC    . rosuvastatin (CRESTOR) 5 MG tablet Take 1 tablet (5 mg total) by mouth daily. For Cholesterol 90 tablet 3   No current facility-administered medications on file prior to visit.   Medical History:  Past Medical History  Diagnosis Date  . Hyperlipidemia   . Hypertension   . Allergy   . DDD (degenerative disc disease)   . OSA (obstructive sleep apnea)   . Other testicular hypofunction     Allergies: No Known Allergies   Review of Systems:  Review of Systems  Constitutional: Negative.   HENT: Negative.   Eyes: Negative.   Respiratory: Negative.   Cardiovascular: Negative.   Gastrointestinal: Negative.   Genitourinary: Negative.   Musculoskeletal: Positive for back pain. Negative for myalgias, joint pain, falls and neck pain.  Skin: Negative.   Neurological: Negative.   Endo/Heme/Allergies: Negative.   Psychiatric/Behavioral: Negative.     Family history- Review and unchanged Social history- Review and unchanged Physical Exam: BP 128/82 mmHg  Pulse 76  Temp(Src) 98.2 F (36.8 C)  Resp 16  Ht 5\' 9"  (1.753 m)  Wt 193 lb (87.544 kg)  BMI 28.49 kg/m2 Wt Readings from Last 3 Encounters:  08/29/14 193 lb (87.544 kg)  05/12/14 197 lb (89.359 kg)  02/07/14 194 lb (87.998 kg)   General Appearance: Well nourished, in no apparent distress. Eyes: PERRLA, EOMs, conjunctiva no swelling or erythema Sinuses: No Frontal/maxillary tenderness ENT/Mouth: Ext aud canals clear, TMs without erythema, bulging. No erythema, swelling, or exudate on post pharynx.  Tonsils not swollen or erythematous. Hearing normal.  Neck: Supple, thyroid normal.  Respiratory: Respiratory effort normal, BS equal bilaterally without rales, rhonchi, wheezing or stridor.  Cardio: RRR with no MRGs. Brisk peripheral pulses without edema.  Abdomen: Soft, + BS,  Non tender, no guarding, rebound, hernias, masses. Lymphatics: Non tender without lymphadenopathy.  Musculoskeletal: Full ROM, 5/5 strength, Antalgic gait, negative straight leg, good distal pulses, strength and sensation. + pain with change in position.  Skin: Warm, dry without rashes, lesions, ecchymosis.  Neuro: Cranial nerves intact. Normal muscle tone, no cerebellar symptoms. Psych: Awake and oriented X 3, normal affect, Insight and Judgment appropriate.    Vicie Mutters, PA-C 8:38 AM [facility]

## 2014-08-30 LAB — TSH: TSH: 2.891 u[IU]/mL (ref 0.350–4.500)

## 2014-08-30 LAB — BASIC METABOLIC PANEL WITH GFR
BUN: 17 mg/dL (ref 6–23)
CALCIUM: 9.3 mg/dL (ref 8.4–10.5)
CO2: 24 mEq/L (ref 19–32)
Chloride: 102 mEq/L (ref 96–112)
Creat: 0.96 mg/dL (ref 0.50–1.35)
GLUCOSE: 80 mg/dL (ref 70–99)
Potassium: 4.4 mEq/L (ref 3.5–5.3)
SODIUM: 139 meq/L (ref 135–145)

## 2014-08-30 LAB — HEPATIC FUNCTION PANEL
ALT: 34 U/L (ref 0–53)
AST: 26 U/L (ref 0–37)
Albumin: 4 g/dL (ref 3.5–5.2)
Alkaline Phosphatase: 43 U/L (ref 39–117)
Bilirubin, Direct: 0.1 mg/dL (ref 0.0–0.3)
Indirect Bilirubin: 0.3 mg/dL (ref 0.2–1.2)
Total Bilirubin: 0.4 mg/dL (ref 0.2–1.2)
Total Protein: 6.5 g/dL (ref 6.0–8.3)

## 2014-08-30 LAB — LIPID PANEL
CHOLESTEROL: 133 mg/dL (ref 0–200)
HDL: 28 mg/dL — ABNORMAL LOW (ref >=40)
LDL Cholesterol: 69 mg/dL (ref 0–99)
TRIGLYCERIDES: 179 mg/dL — AB (ref <150)
Total CHOL/HDL Ratio: 4.8 Ratio
VLDL: 36 mg/dL (ref 0–40)

## 2014-08-30 LAB — HEMOGLOBIN A1C
Hgb A1c MFr Bld: 5.8 % — ABNORMAL HIGH (ref <5.7)
Mean Plasma Glucose: 120 mg/dL — ABNORMAL HIGH (ref <117)

## 2014-08-30 LAB — MAGNESIUM: MAGNESIUM: 1.9 mg/dL (ref 1.5–2.5)

## 2014-08-30 LAB — VITAMIN D 25 HYDROXY (VIT D DEFICIENCY, FRACTURES): VIT D 25 HYDROXY: 90 ng/mL (ref 30–100)

## 2014-08-30 LAB — INSULIN, FASTING: Insulin fasting, serum: 24.3 u[IU]/mL — ABNORMAL HIGH (ref 2.0–19.6)

## 2014-09-13 ENCOUNTER — Other Ambulatory Visit: Payer: Self-pay | Admitting: Physician Assistant

## 2014-10-27 ENCOUNTER — Encounter: Payer: Self-pay | Admitting: Internal Medicine

## 2014-11-22 ENCOUNTER — Other Ambulatory Visit: Payer: Self-pay | Admitting: Internal Medicine

## 2014-12-03 ENCOUNTER — Other Ambulatory Visit: Payer: Self-pay | Admitting: Internal Medicine

## 2014-12-21 ENCOUNTER — Encounter: Payer: Self-pay | Admitting: Internal Medicine

## 2014-12-21 NOTE — Patient Instructions (Addendum)
Recommend Adult Low dose Aspirin or   coated  Aspirin 81 mg daily   To reduce risk of Colon Cancer 20 %,   Skin Cancer 26 % ,   Melanoma 46%   and   Pancreatic cancer 60%  ++++++++++++++++++  Vitamin D goal   is between 70-100.   Please make sure that you are taking your Vitamin D as directed.   It is very important as a natural anti-inflammatory   helping hair, skin, and nails, as well as reducing stroke and heart attack risk.   It helps your bones and helps with mood.  It also decreases numerous cancer risks so please take it as directed.   Low Vit D is associated with a 200-300% higher risk for CANCER   and 200-300% higher risk for HEART   ATTACK  &  STROKE.   ......................................  It is also associated with higher death rate at younger ages,   autoimmune diseases like Rheumatoid arthritis, Lupus, Multiple Sclerosis.     Also many other serious conditions, like depression, Alzheimer's  Dementia, infertility, muscle aches, fatigue, fibromyalgia - just to name a few.  +++++++++++++++++++  Recommend the book "The END of DIETING" by Dr Joel Fuhrman   & the book "The END of DIABETES " by Dr Joel Fuhrman  At [employer] - get book & Audio CD's     Being diabetic has a  300% increased risk for heart attack, stroke, cancer, and alzheimer- type vascular dementia. It is very important that you work harder with diet by avoiding all foods that are white. Avoid white rice (brown & wild rice is OK), white potatoes (sweetpotatoes in moderation is OK), White bread or wheat bread or anything made out of white flour like bagels, donuts, rolls, buns, biscuits, cakes, pastries, cookies, pizza crust, and pasta (made from white flour & egg whites) - vegetarian pasta or spinach or wheat pasta is OK. Multigrain breads like Arnold's or [employer], or multigrain sandwich thins or flatbreads.  Diet, exercise and weight loss can reverse and cure diabetes in the early  stages.  Diet, exercise and weight loss is very important in the control and prevention of complications of diabetes which affects every system in your body, ie. Brain - dementia/stroke, eyes - glaucoma/blindness, heart - heart attack/heart failure, kidneys - dialysis, stomach - gastric paralysis, intestines - malabsorption, nerves - severe painful neuritis, circulation - gangrene & loss of a leg(s), and finally cancer and Alzheimers.    I recommend avoid fried & greasy foods,  sweets/candy, white rice (brown or wild rice or Quinoa is OK), white potatoes (sweet potatoes are OK) - anything made from white flour - bagels, doughnuts, rolls, buns, biscuits,white and wheat breads, pizza crust and traditional pasta made of white flour & egg white(vegetarian pasta or spinach or wheat pasta is OK).  Multi-grain bread is OK - like multi-grain flat bread or sandwich thins. Avoid alcohol in excess. Exercise is also important.    Eat all the vegetables you want - avoid meat, especially red meat and dairy - especially cheese.  Cheese is the most concentrated form of trans-fats which is the worst thing to clog up our arteries. Veggie cheese is OK which can be found in the fresh produce section at [employer] or [employer] or [employer]  ++++++++++++++++++++++++++   Preventive Care for Adults  A healthy lifestyle and preventive care can promote health and wellness. Preventive health guidelines for men include the following key practices:  A routine   yearly physical is a good way to check with your health care provider about your health and preventative screening. It is a chance to share any concerns and updates on your health and to receive a thorough exam.  Visit your dentist for a routine exam and preventative care every 6 months. Brush your teeth twice a day and floss once a day. Good oral hygiene prevents tooth decay and gum disease.  The frequency of eye exams is based on your age, health, family medical  history, use of contact lenses, and other factors. Follow your health care provider's recommendations for frequency of eye exams.  Eat a healthy diet. Foods such as vegetables, fruits, whole grains, low-fat dairy products, and lean protein foods contain the nutrients you need without too many calories. Decrease your intake of foods high in solid fats, added sugars, and salt. Eat the right amount of calories for you.Get information about a proper diet from your health care provider, if necessary.  Regular physical exercise is one of the most important things you can do for your health. Most adults should get at least 150 minutes of moderate-intensity exercise (any activity that increases your heart rate and causes you to sweat) each week. In addition, most adults need muscle-strengthening exercises on 2 or more days a week.  Maintain a healthy weight. The body mass index (BMI) is a screening tool to identify possible weight problems. It provides an estimate of body fat based on height and weight. Your health care provider can find your BMI and can help you achieve or maintain a healthy weight.For adults 20 years and older:  A BMI below 18.5 is considered underweight.  A BMI of 18.5 to 24.9 is normal.  A BMI of 25 to 29.9 is considered overweight.  A BMI of 30 and above is considered obese.  Maintain normal blood lipids and cholesterol levels by exercising and minimizing your intake of saturated fat. Eat a balanced diet with plenty of fruit and vegetables. Blood tests for lipids and cholesterol should begin at age 20 and be repeated every 5 years. If your lipid or cholesterol levels are high, you are over 50, or you are at high risk for heart disease, you may need your cholesterol levels checked more frequently.Ongoing high lipid and cholesterol levels should be treated with medicines if diet and exercise are not working.  If you smoke, find out from your health care provider how to quit. If you  do not use tobacco, do not start.  Lung cancer screening is recommended for adults aged 55-80 years who are at high risk for developing lung cancer because of a history of smoking. A yearly low-dose CT scan of the lungs is recommended for people who have at least a 30-pack-year history of smoking and are a current smoker or have quit within the past 15 years. A pack year of smoking is smoking an average of 1 pack of cigarettes a day for 1 year (for example: 1 pack a day for 30 years or 2 packs a day for 15 years). Yearly screening should continue until the smoker has stopped smoking for at least 15 years. Yearly screening should be stopped for people who develop a health problem that would prevent them from having lung cancer treatment.  If you choose to drink alcohol, do not have more than 2 drinks per day. One drink is considered to be 12 ounces (355 mL) of beer, 5 ounces (148 mL) of wine, or 1.5 ounces (44 mL)   of liquor.  Avoid use of street drugs. Do not share needles with anyone. Ask for help if you need support or instructions about stopping the use of drugs.  High blood pressure causes heart disease and increases the risk of stroke. Your blood pressure should be checked at least every 1-2 years. Ongoing high blood pressure should be treated with medicines, if weight loss and exercise are not effective.  If you are 67-4 years old, ask your health care provider if you should take aspirin to prevent heart disease.  Diabetes screening involves taking a blood sample to check your fasting blood sugar level. This should be done once every 3 years, after age 66, if you are within normal weight and without risk factors for diabetes. Testing should be considered at a younger age or be carried out more frequently if you are overweight and have at least 1 risk factor for diabetes.  Colorectal cancer can be detected and often prevented. Most routine colorectal cancer screening begins at the age of 63 and  continues through age 62. However, your health care provider may recommend screening at an earlier age if you have risk factors for colon cancer. On a yearly basis, your health care provider may provide home test kits to check for hidden blood in the stool. Use of a small camera at the end of a tube to directly examine the colon (sigmoidoscopy or colonoscopy) can detect the earliest forms of colorectal cancer. Talk to your health care provider about this at age 34, when routine screening begins. Direct exam of the colon should be repeated every 5-10 years through age 24, unless early forms of precancerous polyps or small growths are found.   Talk with your health care provider about prostate cancer screening.  Testicular cancer screening isrecommended for adult males. Screening includes self-exam, a health care provider exam, and other screening tests. Consult with your health care provider about any symptoms you have or any concerns you have about testicular cancer.  Use sunscreen. Apply sunscreen liberally and repeatedly throughout the day. You should seek shade when your shadow is shorter than you. Protect yourself by wearing long sleeves, pants, a wide-brimmed hat, and sunglasses year round, whenever you are outdoors.  Once a month, do a whole-body skin exam, using a mirror to look at the skin on your back. Tell your health care provider about new moles, moles that have irregular borders, moles that are larger than a pencil eraser, or moles that have changed in shape or color.  Stay current with required vaccines (immunizations).  Influenza vaccine. All adults should be immunized every year.  Tetanus, diphtheria, and acellular pertussis (Td, Tdap) vaccine. An adult who has not previously received Tdap or who does not know his vaccine status should receive 1 dose of Tdap. This initial dose should be followed by tetanus and diphtheria toxoids (Td) booster doses every 10 years. Adults with an unknown  or incomplete history of completing a 3-dose immunization series with Td-containing vaccines should begin or complete a primary immunization series including a Tdap dose. Adults should receive a Td booster every 10 years.  Varicella vaccine. An adult without evidence of immunity to varicella should receive 2 doses or a second dose if he has previously received 1 dose.  Human papillomavirus (HPV) vaccine. Males aged 75-21 years who have not received the vaccine previously should receive the 3-dose series. Males aged 22-26 years may be immunized. Immunization is recommended through the age of 7 years for any male  who has sex with males and did not get any or all doses earlier. Immunization is recommended for any person with an immunocompromised condition through the age of 66 years if he did not get any or all doses earlier. During the 3-dose series, the second dose should be obtained 4-8 weeks after the first dose. The third dose should be obtained 24 weeks after the first dose and 16 weeks after the second dose.  Zoster vaccine. One dose is recommended for adults aged 76 years or older unless certain conditions are present.    PREVNAR  - Pneumococcal 13-valent conjugate (PCV13) vaccine. When indicated, a person who is uncertain of his immunization history and has no record of immunization should receive the PCV13 vaccine. An adult aged 26 years or older who has certain medical conditions and has not been previously immunized should receive 1 dose of PCV13 vaccine. This PCV13 should be followed with a dose of pneumococcal polysaccharide (PPSV23) vaccine. The PPSV23 vaccine dose should be obtained at least 8 weeks after the dose of PCV13 vaccine. An adult aged 89 years or older who has certain medical conditions and previously received 1 or more doses of PPSV23 vaccine should receive 1 dose of PCV13. The PCV13 vaccine dose should be obtained 1 or more years after the last PPSV23 vaccine  dose.    PNEUMOVAX - Pneumococcal polysaccharide (PPSV23) vaccine. When PCV13 is also indicated, PCV13 should be obtained first. All adults aged 39 years and older should be immunized. An adult younger than age 104 years who has certain medical conditions should be immunized. Any person who resides in a nursing home or long-term care facility should be immunized. An adult smoker should be immunized. People with an immunocompromised condition and certain other conditions should receive both PCV13 and PPSV23 vaccines. People with human immunodeficiency virus (HIV) infection should be immunized as soon as possible after diagnosis. Immunization during chemotherapy or radiation therapy should be avoided. Routine use of PPSV23 vaccine is not recommended for American Indians, [place] Natives, or people younger than 65 years unless there are medical conditions that require PPSV23 vaccine. When indicated, people who have unknown immunization and have no record of immunization should receive PPSV23 vaccine. One-time revaccination 5 years after the first dose of PPSV23 is recommended for people aged 19-64 years who have chronic kidney failure, nephrotic syndrome, asplenia, or immunocompromised conditions. People who received 1-2 doses of PPSV23 before age 56 years should receive another dose of PPSV23 vaccine at age 70 years or later if at least 5 years have passed since the previous dose. Doses of PPSV23 are not needed for people immunized with PPSV23 at or after age 36 years.    Hepatitis A vaccine. Adults who wish to be protected from this disease, have certain high-risk conditions, work with hepatitis A-infected animals, work in hepatitis A research labs, or travel to or work in countries with a high rate of hepatitis A should be immunized. Adults who were previously unvaccinated and who anticipate close contact with an international adoptee during the first 60 days after arrival in the [place] from a country  with a high rate of hepatitis A should be immunized.    Hepatitis B vaccine. Adults should be immunized if they wish to be protected from this disease, have certain high-risk conditions, may be exposed to blood or other infectious body fluids, are household contacts or sex partners of hepatitis B positive people, are clients or workers in certain care facilities, or travel to  or work in countries with a high rate of hepatitis B.   Preventive Service / Frequency   Ages 82 to 47  Blood pressure check.  Lipid and cholesterol check  Lung cancer screening. / Every year if you are aged 72-80 years and have a 30-pack-year history of smoking and currently smoke or have quit within the past 15 years. Yearly screening is stopped once you have quit smoking for at least 15 years or develop a health problem that would prevent you from having lung cancer treatment.  Fecal occult blood test (FOBT) of stool. / Every year beginning at age 61 and continuing until age 46. You may not have to do this test if you get a colonoscopy every 10 years.  Flexible sigmoidoscopy** or colonoscopy.** / Every 5 years for a flexible sigmoidoscopy or every 10 years for a colonoscopy beginning at age 20 and continuing until age 50. Screening for abdominal aortic aneurysm (AAA)  by ultrasound is recommended for people who have history of high blood pressure or who are current or former smokers.  GETTING OFF OF PPI's    Nexium/protonix/prilosec/Omeprazole/Dexilant/Aciphex are called PPI's, they are great at healing your stomach but should only be taken for a short period of time.     Recent studies have shown that taken for a long time they  can increase the risk of osteoporosis (weakening of your bones), pneumonia, low magnesium, restless legs, Cdiff (infection that causes diarrhea), DEMENTIA and most recently kidney damage / disease / insufficiency.     Due to this information we want to try to stop the PPI but if you try  to stop it abruptly this can cause rebound acid and worsening symptoms.   So this is how we want you to get off the PPI:  - Start taking the nexium/protonix/prilosec/PPI  every other day with  zantac (ranitidine) 2 x a day for 2-4 weeks  - then decrease the PPI to every 3 days while taking the zantac (ranitidine) twice a day the other  days for 2-4  Weeks  - then you can try the zantac (ranitidine) once at night or up to 2 x day as needed.  - you can continue on this once at night or stop all together  - Avoid alcohol, spicy foods, NSAIDS (aleve, ibuprofen) at this time. See foods below.   +++++++++++++++++++++++++++++++++++++++++++  Food Choices for Gastroesophageal Reflux Disease  When you have gastroesophageal reflux disease (GERD), the foods you eat and your eating habits are very important. Choosing the right foods can help ease the discomfort of GERD. WHAT GENERAL GUIDELINES DO I NEED TO FOLLOW?  Choose fruits, vegetables, whole grains, low-fat dairy products, and low-fat meat, fish, and poultry.  Limit fats such as oils, salad dressings, butter, nuts, and avocado.  Keep a food diary to identify foods that cause symptoms.  Avoid foods that cause reflux. These may be different for different people.  Eat frequent small meals instead of three large meals each day.  Eat your meals slowly, in a relaxed setting.  Limit fried foods.  Cook foods using methods other than frying.  Avoid drinking alcohol.  Avoid drinking large amounts of liquids with your meals.  Avoid bending over or lying down until 2-3 hours after eating.   WHAT FOODS ARE NOT RECOMMENDED? The following are some foods and drinks that may worsen your symptoms:  Vegetables Tomatoes. Tomato juice. Tomato and spaghetti sauce. Chili peppers. Onion and garlic. Horseradish. Fruits Oranges, grapefruit, and lemon (fruit  and juice).esp NO BANANAS Meats High-fat meats, fish, and poultry. This includes hot dogs,  ribs, ham, sausage, salami, and bacon. Dairy Whole milk and chocolate milk. Sour cream. Cream. Butter. Ice cream. Cream cheese.  Beverages Coffee and tea, with or without caffeine. Carbonated beverages or energy drinks. Condiments Hot sauce. Barbecue sauce.  Sweets/Desserts Chocolate and cocoa. Donuts. Peppermint and spearmint. Fats and Oils High-fat foods, including [place] fries and potato chips. Other Vinegar. Strong spices, such as black pepper, white pepper, red pepper, cayenne, curry powder, cloves, ginger, and chili powder. Nexium/protonix/prilosec are called PPI's, they are great at healing your stomach but should only be taken for a short period of time.

## 2014-12-21 NOTE — Progress Notes (Signed)
Patient ID: Oscar Moore, male   DOB: 11-Jul-1960, 53 y.o.   MRN: [id number]   Comprehensive Examination  This very nice 54 y.o. MWM presents for complete physical.  Patient has been followed for HTN, Prediabetes/Insulin Resistance, Hyperlipidemia, Testosterone and Vitamin D Deficiency. Patient is also on CPAP for OSA with reported improved sleep hygiene and consequent improved sense of well-being.   HTN predates since     . Patient's BP has been controlled at home.Today's BP: 126/84 mmHg. Patient denies any cardiac symptoms as chest pain, palpitations, shortness of breath, dizziness or ankle swelling.   Patient's hyperlipidemia is controlled with diet and medications. Patient denies myalgias or other medication SE's. Last lipids were at goal - Cholesterol 133; HDL 28*; LDL 69; Triglycerides 179 on 08/29/2014.     Patient has prediabetes & Insulin Resistance since  July 2015 with A1c 5.4% /elevated insulin 45 and then A1c 5.6% /elevated insulin 33 in Feb 2016 and patient denies reactive hypoglycemic symptoms, visual blurring, diabetic polys or paresthesias. Last A1c was 5.8% 08/29/2014.   Patient has low T with levels of 166 in 2014 and then 284 & 292 in 2015 and is on Testosterone replacement with feeling of improved stamina & sense of well-being. Finally, patient has history of Vitamin D Deficiency and last vitamin D was 90 on  08/29/2014.     Medication Sig  . FIORICET 50-325-40 MG  TAKE 1 TO 2 TABLETS UP TO 4 TIMES A DAY AS NEEDED FOR HEADACHE  . Celecoxib 200 MG capsule 200 mg daily.   . cetirizine  10 MG tablet Take 10 mg by mouth daily.  . CRESTOR 5 MG tablet Take 1 tablet by mouth  daily for cholesterol.  . esomeprazole  20 MG capsule Take 20 mg by mouth daily at 12 noon. OTC  . KRILL OIL PO Take by mouth daily.  Marland Kitchen lisinopril  20 MG tablet Take 1 tablet by mouth  daily  . pregabalin (LYRICA) 75 MG capsule Take 1 capsule (75 mg total) by mouth 2 (two) times daily.  . tadalafil (CIALIS) 5  MG tablet Take 1 tablet (5 mg total) by mouth daily as needed for erectile dysfunction.  Marland Kitchen DEPO-TESTOSTERONE 200 MG INJ 2 MLS IM EVERY 2 WEEKS OR AS DIRECTED  . NASACORT  Place into the nose. Uses PRN- OTC   No Known Allergies   Past Medical History  Diagnosis Date  . Hyperlipidemia   . Hypertension   . Allergy   . OSA (obstructive sleep apnea)    Health Maintenance  Topic Date Due  . INFLUENZA VACCINE  11/10/2014  . COLONOSCOPY  03/11/2022  . TETANUS/TDAP  10/26/2023  . Hepatitis C Screening  Completed  . HIV Screening  Completed   Immunization History  Administered Date(s) Administered  . PPD Test 10/25/2013, 12/22/2014  . Pneumococcal Polysaccharide-23 12/22/2014  . Tdap 10/25/2013   Past Surgical History  Procedure Laterality Date  . Appendectomy  1983  . Hand surgery Right 2007   Family History  Problem Relation Age of Onset  . Cancer Father     SCC- head and neck   Social History   Social History  . Marital Status: Married    Spouse Name: N/A  . Number of Children: N/A  . Years of Education: N/A   Occupational History  . Not on file.   Social History Main Topics  . Smoking status: Never Smoker   . Smokeless tobacco: Not on file  . Alcohol Use:  1.0 oz/week    2 drink(s) per week  . Drug Use: No  . Sexual Activity: Not on file    ROS Constitutional: Denies fever, chills, weight loss/gain, headaches, insomnia,  night sweats or change in appetite. Does c/o fatigue. Eyes: Denies redness, blurred vision, diplopia, discharge, itchy or watery eyes.  ENT: Denies discharge, congestion, post nasal drip, epistaxis, sore throat, earache, hearing loss, dental pain, Tinnitus, Vertigo, Sinus pain or snoring.  Cardio: Denies chest pain, palpitations, irregular heartbeat, syncope, dyspnea, diaphoresis, orthopnea, PND, claudication or edema Respiratory: denies cough, dyspnea, DOE, pleurisy, hoarseness, laryngitis or wheezing.  Gastrointestinal: Denies dysphagia,  heartburn, reflux, water brash, pain, cramps, nausea, vomiting, bloating, diarrhea, constipation, hematemesis, melena, hematochezia, jaundice or hemorrhoids Genitourinary: Denies dysuria, frequency, urgency, nocturia, hesitancy, discharge, hematuria or flank pain Musculoskeletal: Denies arthralgia, myalgia, stiffness, Jt. Swelling, pain, limp or strain/sprain. Denies Falls. Skin: Denies puritis, rash, hives, warts, acne, eczema or change in skin lesion Neuro: No weakness, tremor, incoordination, spasms, paresthesia or pain Psychiatric: Denies confusion, memory loss or sensory loss. Denies Depression. Endocrine: Denies change in weight, skin, hair change, nocturia, and paresthesia, diabetic polys, visual blurring or hyper / hypo glycemic episodes.  Heme/Lymph: No excessive bleeding, bruising or enlarged lymph nodes.  Physical Exam  BP 126/84   Pulse 88  Temp 97.5 F   Resp 18  Ht 5' 9.5"   Wt 192 lb 6.4 oz     BMI 28.01   General Appearance: Well nourished &  in no apparent distress. Eyes: PERRLA, EOMs, conjunctiva no swelling or erythema, normal fundi and vessels. Sinuses: No frontal/maxillary tenderness ENT/Mouth: EACs patent / TMs  nl. Nares clear without erythema, swelling, mucoid exudates. Oral hygiene is good. No erythema, swelling, or exudate. Tongue normal, non-obstructing. Tonsils not swollen or erythematous. Hearing normal.  Neck: Supple, thyroid normal. No bruits, nodes or JVD. Respiratory: Respiratory effort normal.  BS equal and clear bilateral without rales, rhonci, wheezing or stridor. Cardio: Heart sounds are normal with regular rate and rhythm and no murmurs, rubs or gallops. Peripheral pulses are normal and equal bilaterally without edema. No aortic or femoral bruits. Chest: symmetric with normal excursions and percussion.  Abdomen: Flat, soft, with bowel sounds. Nontender, no guarding, rebound, hernias, masses, or organomegaly.  Lymphatics: Non tender without  lymphadenopathy.  Genitourinary: No hernias.Testes nl. DRE - prostate nl for age - smooth & firm w/o nodules. Musculoskeletal: Full ROM all peripheral extremities, joint stability, 5/5 strength, and normal gait. Skin: Warm and dry without rashes, lesions, cyanosis, clubbing or  ecchymosis.  Neuro: Cranial nerves intact, reflexes equal bilaterally. Normal muscle tone, no cerebellar symptoms. Sensation intact.  Pysch: Alert and oriented X 3 with normal affect, insight and judgment appropriate.   Assessment and Plan  1. Essential hypertension  - Microalbumin / creatinine urine ratio - EKG 12-Lead - [place], RETROPERITNL ABD,  LTD - TSH  2. Hyperlipidemia  - Lipid panel  3. Insulin resistance / Prediabetes  - Hemoglobin A1c - Insulin, random  4. Vitamin D deficiency  - Vit D  25 hydroxy   5. Testosterone deficiency  - Testosterone  6. DDD (degenerative disc disease), lumbar   7. OSA / CPAP    8. Screening for rectal cancer  - POC Hemoccult Bld/Stl   9. Prostate cancer screening  - PSA  10. Other fatigue  - Vitamin B12 - Testosterone - Iron and TIBC - TSH  11. Screening examination for pulmonary tuberculosis  - PPD  12. Need for prophylactic vaccination against  Streptococcus pneumoniae (pneumococcus)  - Pneumococcal polysaccharide vaccine 23-valent greater than or equal to 2yo subcutaneous/IM  13. Encounter for long-term (current) use of medications  - Urinalysis, Routine w reflex microscopic  - BASIC METABOLIC PANEL WITH GFR - CBC with Differential/Platelet - Hepatic function panel - Magnesium  14. BMI 28.0-28.9,adult    Continue prudent diet as discussed, weight control, BP monitoring, regular exercise, and medications as discussed.  Discussed med effects and SE's. Routine screening labs and tests as requested with regular follow-up as recommended.  Over 40 minutes of exam, counseling &  chart review was performed

## 2014-12-22 ENCOUNTER — Encounter: Payer: Self-pay | Admitting: Internal Medicine

## 2014-12-22 ENCOUNTER — Ambulatory Visit (INDEPENDENT_AMBULATORY_CARE_PROVIDER_SITE_OTHER): Payer: 59 | Admitting: Internal Medicine

## 2014-12-22 VITALS — BP 126/84 | HR 88 | Temp 97.5°F | Resp 18 | Ht 69.5 in | Wt 192.4 lb

## 2014-12-22 DIAGNOSIS — Z23 Encounter for immunization: Secondary | ICD-10-CM

## 2014-12-22 DIAGNOSIS — R5383 Other fatigue: Secondary | ICD-10-CM

## 2014-12-22 DIAGNOSIS — Z111 Encounter for screening for respiratory tuberculosis: Secondary | ICD-10-CM

## 2014-12-22 DIAGNOSIS — I10 Essential (primary) hypertension: Secondary | ICD-10-CM | POA: Diagnosis not present

## 2014-12-22 DIAGNOSIS — E8881 Metabolic syndrome: Secondary | ICD-10-CM | POA: Diagnosis not present

## 2014-12-22 DIAGNOSIS — M5136 Other intervertebral disc degeneration, lumbar region: Secondary | ICD-10-CM

## 2014-12-22 DIAGNOSIS — Z6828 Body mass index (BMI) 28.0-28.9, adult: Secondary | ICD-10-CM

## 2014-12-22 DIAGNOSIS — E349 Endocrine disorder, unspecified: Secondary | ICD-10-CM

## 2014-12-22 DIAGNOSIS — G4733 Obstructive sleep apnea (adult) (pediatric): Secondary | ICD-10-CM | POA: Diagnosis not present

## 2014-12-22 DIAGNOSIS — E559 Vitamin D deficiency, unspecified: Secondary | ICD-10-CM | POA: Diagnosis not present

## 2014-12-22 DIAGNOSIS — E785 Hyperlipidemia, unspecified: Secondary | ICD-10-CM

## 2014-12-22 DIAGNOSIS — Z1212 Encounter for screening for malignant neoplasm of rectum: Secondary | ICD-10-CM

## 2014-12-22 DIAGNOSIS — Z79899 Other long term (current) drug therapy: Secondary | ICD-10-CM

## 2014-12-22 DIAGNOSIS — Z125 Encounter for screening for malignant neoplasm of prostate: Secondary | ICD-10-CM | POA: Diagnosis not present

## 2014-12-22 DIAGNOSIS — E291 Testicular hypofunction: Secondary | ICD-10-CM | POA: Diagnosis not present

## 2014-12-22 LAB — BASIC METABOLIC PANEL WITH GFR
BUN: 17 mg/dL (ref 7–25)
CALCIUM: 9.3 mg/dL (ref 8.6–10.3)
CO2: 26 mmol/L (ref 20–31)
Chloride: 101 mmol/L (ref 98–110)
Creat: 1.04 mg/dL (ref 0.70–1.33)
GFR, EST NON AFRICAN AMERICAN: 82 mL/min (ref >=60)
Glucose, Bld: 84 mg/dL (ref 65–99)
POTASSIUM: 4.1 mmol/L (ref 3.5–5.3)
SODIUM: 142 mmol/L (ref 135–146)

## 2014-12-22 LAB — HEPATIC FUNCTION PANEL
ALBUMIN: 3.9 g/dL (ref 3.6–5.1)
ALT: 42 U/L (ref 9–46)
AST: 31 U/L (ref 10–35)
Alkaline Phosphatase: 51 U/L (ref 40–115)
BILIRUBIN DIRECT: 0.1 mg/dL (ref <=0.2)
BILIRUBIN TOTAL: 0.4 mg/dL (ref 0.2–1.2)
Indirect Bilirubin: 0.3 mg/dL (ref 0.2–1.2)
Total Protein: 6.3 g/dL (ref 6.1–8.1)

## 2014-12-22 LAB — CBC WITH DIFFERENTIAL/PLATELET
BASOS PCT: 1 % (ref 0–1)
Basophils Absolute: 0.1 10*3/uL (ref 0.0–0.1)
EOS ABS: 0.5 10*3/uL (ref 0.0–0.7)
Eosinophils Relative: 7 % — ABNORMAL HIGH (ref 0–5)
HCT: 46.9 % (ref 39.0–52.0)
HEMOGLOBIN: 15.2 g/dL (ref 13.0–17.0)
Lymphocytes Relative: 21 % (ref 12–46)
Lymphs Abs: 1.6 10*3/uL (ref 0.7–4.0)
MCH: 26 pg (ref 26.0–34.0)
MCHC: 32.4 g/dL (ref 30.0–36.0)
MCV: 80.2 fL (ref 78.0–100.0)
MONOS PCT: 5 % (ref 3–12)
MPV: 9.3 fL (ref 8.6–12.4)
Monocytes Absolute: 0.4 10*3/uL (ref 0.1–1.0)
NEUTROS ABS: 5 10*3/uL (ref 1.7–7.7)
NEUTROS PCT: 66 % (ref 43–77)
PLATELETS: 254 10*3/uL (ref 150–400)
RBC: 5.85 MIL/uL — AB (ref 4.22–5.81)
RDW: 15.8 % — ABNORMAL HIGH (ref 11.5–15.5)
WBC: 7.5 10*3/uL (ref 4.0–10.5)

## 2014-12-22 LAB — VITAMIN B12: Vitamin B-12: 679 pg/mL (ref 211–911)

## 2014-12-22 LAB — MAGNESIUM: MAGNESIUM: 2 mg/dL (ref 1.5–2.5)

## 2014-12-22 LAB — LIPID PANEL
CHOL/HDL RATIO: 4.7 ratio (ref <=5.0)
Cholesterol: 132 mg/dL (ref 125–200)
HDL: 28 mg/dL — AB (ref >=40)
LDL Cholesterol: 67 mg/dL (ref <130)
TRIGLYCERIDES: 184 mg/dL — AB (ref <150)
VLDL: 37 mg/dL — ABNORMAL HIGH (ref <30)

## 2014-12-22 LAB — IRON AND TIBC
%SAT: 6 % — AB (ref 15–60)
Iron: 21 ug/dL — ABNORMAL LOW (ref 50–180)
TIBC: 327 ug/dL (ref 250–425)
UIBC: 306 ug/dL (ref 125–400)

## 2014-12-22 LAB — TSH: TSH: 3.993 u[IU]/mL (ref 0.350–4.500)

## 2014-12-23 ENCOUNTER — Encounter: Payer: Self-pay | Admitting: Internal Medicine

## 2014-12-23 LAB — INSULIN, RANDOM: INSULIN: 52.2 u[IU]/mL — AB (ref 2.0–19.6)

## 2014-12-23 LAB — URINALYSIS, ROUTINE W REFLEX MICROSCOPIC
Bilirubin Urine: NEGATIVE
Glucose, UA: NEGATIVE
HGB URINE DIPSTICK: NEGATIVE
KETONES UR: NEGATIVE
LEUKOCYTES UA: NEGATIVE
NITRITE: NEGATIVE
PROTEIN: NEGATIVE
Specific Gravity, Urine: 1.017 (ref 1.001–1.035)
pH: 6 (ref 5.0–8.0)

## 2014-12-23 LAB — HEMOGLOBIN A1C
Hgb A1c MFr Bld: 5.6 % (ref <5.7)
Mean Plasma Glucose: 114 mg/dL (ref <117)

## 2014-12-23 LAB — MICROALBUMIN / CREATININE URINE RATIO
CREATININE, URINE: 112.7 mg/dL
MICROALB UR: 0.7 mg/dL (ref <2.0)
Microalb Creat Ratio: 6.2 mg/g (ref 0.0–30.0)

## 2014-12-23 LAB — VITAMIN D 25 HYDROXY (VIT D DEFICIENCY, FRACTURES): VIT D 25 HYDROXY: 80 ng/mL (ref 30–100)

## 2014-12-23 LAB — PSA: PSA: 3.53 ng/mL (ref <=4.00)

## 2014-12-23 LAB — TESTOSTERONE: Testosterone: 702 ng/dL (ref 300–890)

## 2015-01-15 ENCOUNTER — Other Ambulatory Visit: Payer: Self-pay | Admitting: Internal Medicine

## 2015-02-04 ENCOUNTER — Other Ambulatory Visit: Payer: Self-pay | Admitting: Internal Medicine

## 2015-02-20 ENCOUNTER — Other Ambulatory Visit: Payer: Self-pay | Admitting: *Deleted

## 2015-02-20 MED ORDER — TESTOSTERONE CYPIONATE 200 MG/ML IM SOLN: INTRAMUSCULAR | Status: DC

## 2015-04-03 ENCOUNTER — Ambulatory Visit: Payer: Self-pay | Admitting: Internal Medicine

## 2015-04-15 ENCOUNTER — Ambulatory Visit (INDEPENDENT_AMBULATORY_CARE_PROVIDER_SITE_OTHER): Payer: 59 | Admitting: Internal Medicine

## 2015-04-15 ENCOUNTER — Other Ambulatory Visit: Payer: Self-pay | Admitting: Internal Medicine

## 2015-04-15 VITALS — BP 112/60 | HR 98 | Temp 98.2°F | Resp 16 | Ht 69.0 in | Wt 193.0 lb

## 2015-04-15 DIAGNOSIS — E785 Hyperlipidemia, unspecified: Secondary | ICD-10-CM

## 2015-04-15 DIAGNOSIS — E559 Vitamin D deficiency, unspecified: Secondary | ICD-10-CM | POA: Diagnosis not present

## 2015-04-15 DIAGNOSIS — E291 Testicular hypofunction: Secondary | ICD-10-CM | POA: Diagnosis not present

## 2015-04-15 DIAGNOSIS — R972 Elevated prostate specific antigen [PSA]: Secondary | ICD-10-CM

## 2015-04-15 DIAGNOSIS — Z79899 Other long term (current) drug therapy: Secondary | ICD-10-CM

## 2015-04-15 DIAGNOSIS — I10 Essential (primary) hypertension: Secondary | ICD-10-CM | POA: Diagnosis not present

## 2015-04-15 DIAGNOSIS — E8881 Metabolic syndrome: Secondary | ICD-10-CM

## 2015-04-15 DIAGNOSIS — E349 Endocrine disorder, unspecified: Secondary | ICD-10-CM

## 2015-04-15 LAB — BASIC METABOLIC PANEL WITH GFR
BUN: 14 mg/dL (ref 7–25)
CHLORIDE: 101 mmol/L (ref 98–110)
CO2: 28 mmol/L (ref 20–31)
Calcium: 9.7 mg/dL (ref 8.6–10.3)
Creat: 1.03 mg/dL (ref 0.70–1.33)
GFR, EST NON AFRICAN AMERICAN: 82 mL/min (ref >=60)
Glucose, Bld: 89 mg/dL (ref 65–99)
POTASSIUM: 4.1 mmol/L (ref 3.5–5.3)
SODIUM: 139 mmol/L (ref 135–146)

## 2015-04-15 LAB — CBC WITH DIFFERENTIAL/PLATELET
BASOS ABS: 0.1 10*3/uL (ref 0.0–0.1)
Basophils Relative: 1 % (ref 0–1)
EOS ABS: 0.6 10*3/uL (ref 0.0–0.7)
EOS PCT: 8 % — AB (ref 0–5)
HCT: 48.8 % (ref 39.0–52.0)
Hemoglobin: 16.2 g/dL (ref 13.0–17.0)
LYMPHS PCT: 19 % (ref 12–46)
Lymphs Abs: 1.4 10*3/uL (ref 0.7–4.0)
MCH: 26.7 pg (ref 26.0–34.0)
MCHC: 33.2 g/dL (ref 30.0–36.0)
MCV: 80.5 fL (ref 78.0–100.0)
MPV: 9.5 fL (ref 8.6–12.4)
Monocytes Absolute: 0.5 10*3/uL (ref 0.1–1.0)
Monocytes Relative: 7 % (ref 3–12)
NEUTROS PCT: 65 % (ref 43–77)
Neutro Abs: 4.9 10*3/uL (ref 1.7–7.7)
PLATELETS: 259 10*3/uL (ref 150–400)
RBC: 6.06 MIL/uL — AB (ref 4.22–5.81)
RDW: 14.6 % (ref 11.5–15.5)
WBC: 7.5 10*3/uL (ref 4.0–10.5)

## 2015-04-15 LAB — LIPID PANEL
CHOL/HDL RATIO: 4.1 ratio (ref <=5.0)
Cholesterol: 119 mg/dL — ABNORMAL LOW (ref 125–200)
HDL: 29 mg/dL — AB (ref >=40)
LDL CALC: 50 mg/dL (ref <130)
Triglycerides: 201 mg/dL — ABNORMAL HIGH (ref <150)
VLDL: 40 mg/dL — AB (ref <30)

## 2015-04-15 LAB — HEPATIC FUNCTION PANEL
ALBUMIN: 4.3 g/dL (ref 3.6–5.1)
ALK PHOS: 48 U/L (ref 40–115)
ALT: 52 U/L — ABNORMAL HIGH (ref 9–46)
AST: 33 U/L (ref 10–35)
BILIRUBIN DIRECT: 0.1 mg/dL (ref <=0.2)
BILIRUBIN INDIRECT: 0.4 mg/dL (ref 0.2–1.2)
BILIRUBIN TOTAL: 0.5 mg/dL (ref 0.2–1.2)
Total Protein: 6.6 g/dL (ref 6.1–8.1)

## 2015-04-15 LAB — HEMOGLOBIN A1C
HEMOGLOBIN A1C: 5.4 % (ref <5.7)
Mean Plasma Glucose: 108 mg/dL (ref <117)

## 2015-04-15 LAB — TSH: TSH: 3.997 u[IU]/mL (ref 0.350–4.500)

## 2015-04-15 NOTE — Addendum Note (Signed)
Addended by: Dolores Hoose on: 04/15/2015 09:39 AM   Modules accepted: Orders

## 2015-04-15 NOTE — Progress Notes (Signed)
Patient ID: Oscar Moore, male   DOB: 09/25/1960, 55 y.o.   MRN: [id number]  Assessment and Plan:  Hypertension:  -Continue medication,  -monitor blood pressure at home.  -Continue DASH diet.   -Reminder to go to the ER if any CP, SOB, nausea, dizziness, severe HA, changes vision/speech, left arm numbness and tingling, and jaw pain.  Cholesterol: -Continue diet and exercise.  -Check cholesterol.   Pre-diabetes: -Continue diet and exercise.  -Check A1C  Vitamin D Def: -check level -continue medications.   Elevated PSA -recheck today  -was mildly elevated  Continue diet and meds as discussed. Further disposition pending results of labs.  HPI 55 y.o. male  presents for 3 month follow up with hypertension, hyperlipidemia, prediabetes and vitamin D.   His blood pressure has been controlled at home, today their BP is BP: 112/60 mmHg.   He does workout. He denies chest pain, shortness of breath, dizziness.   He is on cholesterol medication and denies myalgias. His cholesterol is at goal. The cholesterol last visit was:   Lab Results  Component Value Date   CHOL 132 12/22/2014   HDL 28* 12/22/2014   LDLCALC 67 12/22/2014   TRIG 184* 12/22/2014   CHOLHDL 4.7 12/22/2014     He has been working on diet and exercise for prediabetes, and denies foot ulcerations, hyperglycemia, hypoglycemia , increased appetite, nausea, paresthesia of the feet, polydipsia, polyuria, visual disturbances, vomiting and weight loss. Last A1C in the office was:  Lab Results  Component Value Date   HGBA1C 5.6 12/22/2014    Patient is on Vitamin D supplement.  Lab Results  Component Value Date   VD25OH 54 12/22/2014     Patient reports that he has been having some pain in the right side of his neck for the past 2 weeks.  He reports that he has no injury he can think of.  He reports that it is getting better.    He is doing testosterone injections.  He does them every 2 weeks.  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     Current Medications:  Current Outpatient Prescriptions on File Prior to Visit  Medication Sig Dispense Refill  . butalbital-acetaminophen-caffeine (FIORICET, ESGIC) 50-325-40 MG per tablet TAKE 1 TO 2 TABLETS UP TO 4 TIMES A DAY AS NEEDED FOR HEADACHE 30 tablet 0  . Butalbital-APAP-Caffeine 50-300-40 MG CAPS Take 1-2 capsules every 4  hours as needed for headache not to exceed 8 per day 50 capsule 0  . celecoxib (CELEBREX) 200 MG capsule 200 mg daily.     . cetirizine (ZYRTEC) 10 MG tablet Take 10 mg by mouth daily.    . CRESTOR 5 MG tablet Take 1 tablet by mouth  daily for cholesterol. 90 tablet 0  . KRILL OIL PO Take by mouth daily.    Marland Kitchen lisinopril (PRINIVIL,ZESTRIL) 20 MG tablet Take 1 tablet by mouth  daily 90 tablet 0  . OMEPRAZOLE PO Take 20 mg by mouth daily. OTC    . pregabalin (LYRICA) 75 MG capsule Take 1 capsule (75 mg total) by mouth 2 (two) times daily. 180 capsule 3  . SYRINGE-NEEDLE, DISP, 3 ML (B-D 3CC LUER-LOK SYR 22GX1") 22G X 1" 3 ML MISC Checks sugar 1 daily for fluctuating sugars for prediabetes 50 each 3  . tadalafil (CIALIS) 5 MG tablet Take 1 tablet (5 mg total) by mouth daily as needed for erectile dysfunction. 30 tablet 11  . testosterone cypionate (DEPOTESTOSTERONE  CYPIONATE) 200 MG/ML injection INJECT 2 MLS INTRAMUSCULARLY EVERY 2 WEEKS OR AS DIRECTED 10 mL 3  . traMADol (ULTRAM) 50 MG tablet TAKE 1 TO 2 TABLETS UP TO FOUR TIMES DAILY 60 tablet 1  . Triamcinolone Acetonide (NASACORT AQ NA) Place into the nose. Uses PRN- OTC     No current facility-administered medications on file prior to visit.    Medical History:  Past Medical History  Diagnosis Date  . Hyperlipidemia   . Hypertension   . Allergy   . OSA (obstructive sleep apnea)     Allergies: No Known Allergies   Review of Systems:  Review of Systems  Constitutional: Negative for fever, chills and malaise/fatigue.  HENT: Negative for congestion, ear pain and  sore throat.   Respiratory: Negative for cough, shortness of breath and wheezing.   Cardiovascular: Negative for chest pain, palpitations and leg swelling.  Gastrointestinal: Positive for heartburn. Negative for diarrhea, constipation, blood in stool and melena.  Genitourinary: Negative.   Neurological: Negative for dizziness, sensory change, loss of consciousness and headaches.  Psychiatric/Behavioral: Negative for depression. The patient is not nervous/anxious and does not have insomnia.     Family history- Review and unchanged  Social history- Review and unchanged  Physical Exam: BP 112/60 mmHg  Pulse 98  Temp(Src) 98.2 F (36.8 C) (Temporal)  Resp 16  Ht 5\' 9"  (1.753 m)  Wt 193 lb (87.544 kg)  BMI 28.49 kg/m2 Wt Readings from Last 3 Encounters:  04/15/15 193 lb (87.544 kg)  12/22/14 192 lb 6.4 oz (87.272 kg)  08/29/14 193 lb (87.544 kg)    General Appearance: Well nourished well developed, in no apparent distress. Eyes: PERRLA, EOMs, conjunctiva no swelling or erythema ENT/Mouth: Ear canals normal without obstruction, swelling, erythma, discharge.  TMs normal bilaterally.  Oropharynx moist, clear, without exudate, or postoropharyngeal swelling. Neck: Supple, thyroid normal,no cervical adenopathy  Respiratory: Respiratory effort normal, Breath sounds clear A&P without rhonchi, wheeze, or rale.  No retractions, no accessory usage. Cardio: RRR with no MRGs. Brisk peripheral pulses without edema.  Abdomen: Soft, + BS,  Non tender, no guarding, rebound, hernias, masses. Musculoskeletal: Full ROM, 5/5 strength, Normal gait Skin: Warm, dry without rashes, lesions, ecchymosis.  Neuro: Awake and oriented X 3, Cranial nerves intact. Normal muscle tone, no cerebellar symptoms. Psych: Normal affect, Insight and Judgment appropriate.    Starlyn Skeans, PA-C 9:10 AM [facility]

## 2015-04-16 LAB — PSA, TOTAL AND FREE
PSA FREE: 0.69 ng/mL
PSA, Free Pct: 21 % — ABNORMAL LOW (ref >25)
PSA: 3.33 ng/mL (ref <=4.00)

## 2015-05-25 ENCOUNTER — Other Ambulatory Visit: Payer: Self-pay | Admitting: Internal Medicine

## 2015-05-25 ENCOUNTER — Other Ambulatory Visit: Payer: Self-pay | Admitting: Physician Assistant

## 2015-05-25 DIAGNOSIS — M549 Dorsalgia, unspecified: Secondary | ICD-10-CM

## 2015-05-25 DIAGNOSIS — G44229 Chronic tension-type headache, not intractable: Secondary | ICD-10-CM

## 2015-05-25 MED ORDER — BUTALBITAL-APAP-CAFFEINE 50-325-40 MG PO TABS: ORAL_TABLET | ORAL | Status: DC

## 2015-06-08 ENCOUNTER — Other Ambulatory Visit: Payer: Self-pay | Admitting: *Deleted

## 2015-06-08 MED ORDER — PREGABALIN 75 MG PO CAPS: 75.0000 mg | ORAL_CAPSULE | Freq: Two times a day (BID) | ORAL | Status: DC

## 2015-07-07 ENCOUNTER — Ambulatory Visit: Payer: Self-pay | Admitting: Internal Medicine

## 2015-07-15 ENCOUNTER — Encounter: Payer: Self-pay | Admitting: Internal Medicine

## 2015-07-15 ENCOUNTER — Ambulatory Visit (INDEPENDENT_AMBULATORY_CARE_PROVIDER_SITE_OTHER): Payer: 59 | Admitting: Internal Medicine

## 2015-07-15 VITALS — BP 124/80 | HR 88 | Temp 97.5°F | Resp 16 | Ht 69.5 in | Wt 191.2 lb

## 2015-07-15 DIAGNOSIS — I10 Essential (primary) hypertension: Secondary | ICD-10-CM | POA: Diagnosis not present

## 2015-07-15 DIAGNOSIS — E559 Vitamin D deficiency, unspecified: Secondary | ICD-10-CM

## 2015-07-15 DIAGNOSIS — E8881 Metabolic syndrome: Secondary | ICD-10-CM

## 2015-07-15 DIAGNOSIS — E785 Hyperlipidemia, unspecified: Secondary | ICD-10-CM | POA: Diagnosis not present

## 2015-07-15 DIAGNOSIS — R972 Elevated prostate specific antigen [PSA]: Secondary | ICD-10-CM

## 2015-07-15 DIAGNOSIS — Z79899 Other long term (current) drug therapy: Secondary | ICD-10-CM

## 2015-07-15 DIAGNOSIS — E291 Testicular hypofunction: Secondary | ICD-10-CM | POA: Diagnosis not present

## 2015-07-15 DIAGNOSIS — E349 Endocrine disorder, unspecified: Secondary | ICD-10-CM

## 2015-07-15 LAB — TSH: TSH: 3.87 mIU/L (ref 0.40–4.50)

## 2015-07-15 LAB — HEPATIC FUNCTION PANEL
ALBUMIN: 4.5 g/dL (ref 3.6–5.1)
ALK PHOS: 52 U/L (ref 40–115)
ALT: 49 U/L — ABNORMAL HIGH (ref 9–46)
AST: 30 U/L (ref 10–35)
BILIRUBIN INDIRECT: 0.4 mg/dL (ref 0.2–1.2)
BILIRUBIN TOTAL: 0.5 mg/dL (ref 0.2–1.2)
Bilirubin, Direct: 0.1 mg/dL (ref <=0.2)
Total Protein: 7.1 g/dL (ref 6.1–8.1)

## 2015-07-15 LAB — BASIC METABOLIC PANEL WITH GFR
BUN: 24 mg/dL (ref 7–25)
CALCIUM: 9.8 mg/dL (ref 8.6–10.3)
CO2: 24 mmol/L (ref 20–31)
Chloride: 105 mmol/L (ref 98–110)
Creat: 0.96 mg/dL (ref 0.70–1.33)
GFR, EST NON AFRICAN AMERICAN: 89 mL/min (ref >=60)
Glucose, Bld: 93 mg/dL (ref 65–99)
Potassium: 4.5 mmol/L (ref 3.5–5.3)
SODIUM: 139 mmol/L (ref 135–146)

## 2015-07-15 LAB — HEMOGLOBIN A1C
Hgb A1c MFr Bld: 5.4 % (ref <5.7)
MEAN PLASMA GLUCOSE: 108 mg/dL

## 2015-07-15 LAB — CBC WITH DIFFERENTIAL/PLATELET
BASOS PCT: 1 %
Basophils Absolute: 70 cells/uL (ref 0–200)
EOS ABS: 350 {cells}/uL (ref 15–500)
EOS PCT: 5 %
HCT: 50.1 % — ABNORMAL HIGH (ref 38.5–50.0)
Hemoglobin: 16.4 g/dL (ref 13.2–17.1)
Lymphocytes Relative: 19 %
Lymphs Abs: 1330 cells/uL (ref 850–3900)
MCH: 26.9 pg — ABNORMAL LOW (ref 27.0–33.0)
MCHC: 32.7 g/dL (ref 32.0–36.0)
MCV: 82.1 fL (ref 80.0–100.0)
MONOS PCT: 7 %
MPV: 9.4 fL (ref 7.5–12.5)
Monocytes Absolute: 490 cells/uL (ref 200–950)
NEUTROS ABS: 4760 {cells}/uL (ref 1500–7800)
Neutrophils Relative %: 68 %
PLATELETS: 262 10*3/uL (ref 140–400)
RBC: 6.1 MIL/uL — AB (ref 4.20–5.80)
RDW: 15 % (ref 11.0–15.0)
WBC: 7 10*3/uL (ref 3.8–10.8)

## 2015-07-15 LAB — LIPID PANEL
CHOL/HDL RATIO: 5.6 ratio — AB (ref <=5.0)
CHOLESTEROL: 150 mg/dL (ref 125–200)
HDL: 27 mg/dL — ABNORMAL LOW (ref >=40)
LDL Cholesterol: 80 mg/dL (ref <130)
Triglycerides: 214 mg/dL — ABNORMAL HIGH (ref <150)
VLDL: 43 mg/dL — ABNORMAL HIGH (ref <30)

## 2015-07-15 LAB — MAGNESIUM: MAGNESIUM: 2 mg/dL (ref 1.5–2.5)

## 2015-07-15 NOTE — Patient Instructions (Signed)

## 2015-07-15 NOTE — Progress Notes (Signed)
Patient ID: Oscar Moore, male   DOB: February 06, 1961, 55 y.o.   MRN: [id number]   This very nice 55 y.o. 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 Patient also is on CPAP(4cm)  for OSA. Patient also hs hx/o elevated PSA and to be rechecked today.    Patient is treated for HTN since 2007 & BP has been controlled at home. Today's BP: 124/80 mmHg. Patient has had no complaints of any cardiac type chest pain, palpitations, dyspnea/orthopnea/PND, dizziness, claudication, or dependent edema.   Hyperlipidemia (also 2007)  is controlled with diet & meds. Patient denies myalgias or other med SE's. Last Lipids were 04/15/2015: Cholesterol 119*; HDL 29*; LDL Cholesterol 50; Triglycerides 201 on    Also, the patient has history of PreDiabetes/Insulin Resistance  with A1c 5.4% and elevated Insulin 45 in July 2015  and has had no symptoms of reactive hypoglycemia, diabetic polys, paresthesias or visual blurring.  Last A1c was  5.4% on 04/15/2015.   Patienis on Depo-Testosterone injections and last shot was about 10 days ago. He relates that he feels the shot seems to last about a week. Further, the patient also has history of Vitamin D Deficiency and supplements vitamin D without any suspected side-effects. Last vitamin D was 80 on 12/22/2014.  Medication Sig  . FIORICET50-325-40 MG TAKE 1 TO 2 TABLETS UP TO 4 TIMES A DAY AS NEEDED FOR HEADACHE  . cetirizine (ZYRTEC) 10 MG tablet Take 10 mg by mouth daily. Reported on 07/15/2015  . CRESTOR 5 MG tablet Take 1 tablet by mouth  daily for cholesterol.  Marland Kitchen KRILL OIL PO Take by mouth daily. Reported on 07/15/2015  . lisinopril  20 MG tablet Take 1 tablet by mouth  daily  . OMEPRAZOLE PO Take 20 mg by mouth daily. Reported on 07/15/2015  . pregabalin (LYRICA) 75 MG capsule Take 1 capsule (75 mg total) by mouth 2 (two) times daily.  . tadalafil (CIALIS) 5 MG tablet  Take 1 tablet (5 mg total) by mouth daily as needed for erectile dysfunction.  Marland Kitchen testosterone cypionate 200 MG/ML inj INJECT 2 MLS INTRAMUSCULARLY EVERY 2 WEEKS OR AS DIRECTED  . traMADol  50 MG tablet TAKE 1 TO 2 TABLETS BY MOUTH UP TO 4 TIMES DAILY  . NASACORT AQ  Place into the nose. Reported on 07/15/2015   No Known Allergies  PMHx:   Past Medical History  Diagnosis Date  . Hyperlipidemia   . Hypertension   . Allergy   . OSA (obstructive sleep apnea)    Immunization History  Administered Date(s) Administered  . PPD Test 10/25/2013, 12/22/2014  . Pneumococcal Polysaccharide-23 12/22/2014  . Tdap 10/25/2013   Past Surgical History  Procedure Laterality Date  . Appendectomy  1983  . Hand surgery Right 2007   FHx:    Reviewed / unchanged  SHx:    Reviewed / unchanged  Systems Review:  Constitutional: Denies fever, chills, wt changes, headaches, insomnia, fatigue, night sweats, change in appetite. Eyes: Denies redness, blurred vision, diplopia, discharge, itchy, watery eyes.  ENT: Denies discharge, congestion, post nasal drip, epistaxis, sore throat, earache, hearing loss, dental pain, tinnitus, vertigo, sinus pain, snoring.  CV: Denies chest pain, palpitations, irregular heartbeat, syncope, dyspnea, diaphoresis, orthopnea, PND, claudication or edema. Respiratory: denies cough, dyspnea, DOE, pleurisy, hoarseness, laryngitis, wheezing.  Gastrointestinal: Denies dysphagia, odynophagia, heartburn, reflux, water brash, abdominal pain or cramps, nausea, vomiting, bloating,  diarrhea, constipation, hematemesis, melena, hematochezia  or hemorrhoids. Genitourinary: Denies dysuria, frequency, urgency, nocturia, hesitancy, discharge, hematuria or flank pain. Musculoskeletal: Denies arthralgias, myalgias, stiffness, jt. swelling, pain, limping or strain/sprain.  Skin: Denies pruritus, rash, hives, warts, acne, eczema or change in skin lesion(s). Neuro: No weakness, tremor, incoordination,  spasms, paresthesia or pain. Psychiatric: Denies confusion, memory loss or sensory loss. Endo: Denies change in weight, skin or hair change.  Heme/Lymph: No excessive bleeding, bruising or enlarged lymph nodes.  Physical Exam  BP 124/80 mmHg  Pulse 88  Temp(Src) 97.5 F (36.4 C)  Resp 16  Ht 5' 9.5" (1.765 m)  Wt 191 lb 3.2 oz (86.728 kg)  BMI 27.84 kg/m2  Appears well nourished and in no distress.  Eyes: PERRLA, EOMs, conjunctiva no swelling or erythema. Sinuses: No frontal/maxillary tenderness ENT/Mouth: EAC's clear, TM's nl w/o erythema, bulging. Nares clear w/o erythema, swelling, exudates. Oropharynx clear without erythema or exudates. Oral hygiene is good. Tongue normal, non obstructing. Hearing intact.  Neck: Supple. Thyroid nl. Car 2+/2+ without bruits, nodes or JVD. Chest: Respirations nl with BS clear & equal w/o rales, rhonchi, wheezing or stridor.  Cor: Heart sounds normal w/ regular rate and rhythm without sig. murmurs, gallops, clicks, or rubs. Peripheral pulses normal and equal  without edema.  Abdomen: Soft & bowel sounds normal. Non-tender w/o guarding, rebound, hernias, masses, or organomegaly.  Lymphatics: Unremarkable.  Musculoskeletal: Full ROM all peripheral extremities, joint stability, 5/5 strength, and normal gait.  Skin: Warm, dry without exposed rashes, lesions or ecchymosis apparent.  Neuro: Cranial nerves intact, reflexes equal bilaterally. Sensory-motor testing grossly intact. Tendon reflexes grossly intact.  Pysch: Alert & oriented x 3.  Insight and judgement nl & appropriate. No ideations.  Assessment and Plan:  1. Essential hypertension  - TSH  2. Hyperlipidemia  - Lipid panel - TSH  3. Insulin resistance / Prediabetes  - Hemoglobin A1c - Insulin, random  4. Vitamin D deficiency  - VITAMIN D 25 Hydroxy   5. Testosterone deficiency  - discussed changing Depo-T to 1 cc IM q 1week  6. Elevated PSA  - PSA, total and free;  Future  7. Medication management  - BASIC METABOLIC PANEL WITH GFR - CBC with Differential/Platelet - Hepatic function panel - Magnesium   Recommended regular exercise, BP monitoring, weight control, and discussed med and SE's. Recommended labs to assess and monitor clinical status. Further disposition pending results of labs. Over 30 minutes of exam, counseling, chart review was performed

## 2015-07-16 LAB — INSULIN, RANDOM: Insulin: 49.4 u[IU]/mL — ABNORMAL HIGH (ref 2.0–19.6)

## 2015-07-16 LAB — PSA, TOTAL AND FREE
PSA FREE: 0.56 ng/mL
PSA, Free Pct: 24 % (ref >25)
PSA: 2.38 ng/mL (ref <=4.00)

## 2015-07-16 LAB — VITAMIN D 25 HYDROXY (VIT D DEFICIENCY, FRACTURES): Vit D, 25-Hydroxy: 86 ng/mL (ref 30–100)

## 2015-07-31 ENCOUNTER — Other Ambulatory Visit: Payer: Self-pay | Admitting: Physician Assistant

## 2015-08-11 ENCOUNTER — Other Ambulatory Visit: Payer: Self-pay | Admitting: Internal Medicine

## 2015-09-19 ENCOUNTER — Other Ambulatory Visit: Payer: Self-pay | Admitting: Internal Medicine

## 2015-10-19 ENCOUNTER — Ambulatory Visit: Payer: Self-pay | Admitting: Physician Assistant

## 2015-10-28 ENCOUNTER — Other Ambulatory Visit: Payer: Self-pay | Admitting: Orthopaedic Surgery

## 2015-10-28 DIAGNOSIS — M5137 Other intervertebral disc degeneration, lumbosacral region: Secondary | ICD-10-CM

## 2015-10-29 ENCOUNTER — Other Ambulatory Visit: Payer: Self-pay | Admitting: Internal Medicine

## 2015-11-05 ENCOUNTER — Ambulatory Visit
Admission: RE | Admit: 2015-11-05 | Discharge: 2015-11-05 | Disposition: A | Discharge status: Home / Self Care | Payer: 59 | Source: Ambulatory Visit | Attending: Orthopaedic Surgery | Admitting: Orthopaedic Surgery

## 2015-11-05 DIAGNOSIS — M5137 Other intervertebral disc degeneration, lumbosacral region: Secondary | ICD-10-CM

## 2015-11-11 ENCOUNTER — Ambulatory Visit (INDEPENDENT_AMBULATORY_CARE_PROVIDER_SITE_OTHER): Payer: 59 | Admitting: Physician Assistant

## 2015-11-11 ENCOUNTER — Encounter: Payer: Self-pay | Admitting: Physician Assistant

## 2015-11-11 VITALS — BP 132/80 | HR 81 | Temp 97.7°F | Resp 16 | Ht 69.5 in | Wt 189.8 lb

## 2015-11-11 DIAGNOSIS — E785 Hyperlipidemia, unspecified: Secondary | ICD-10-CM | POA: Diagnosis not present

## 2015-11-11 DIAGNOSIS — E349 Endocrine disorder, unspecified: Secondary | ICD-10-CM

## 2015-11-11 DIAGNOSIS — E291 Testicular hypofunction: Secondary | ICD-10-CM | POA: Diagnosis not present

## 2015-11-11 DIAGNOSIS — E8881 Metabolic syndrome: Secondary | ICD-10-CM

## 2015-11-11 DIAGNOSIS — E559 Vitamin D deficiency, unspecified: Secondary | ICD-10-CM

## 2015-11-11 DIAGNOSIS — I10 Essential (primary) hypertension: Secondary | ICD-10-CM | POA: Diagnosis not present

## 2015-11-11 DIAGNOSIS — Z79899 Other long term (current) drug therapy: Secondary | ICD-10-CM | POA: Diagnosis not present

## 2015-11-11 LAB — CBC WITH DIFFERENTIAL/PLATELET
Basophils Absolute: 70 cells/uL (ref 0–200)
Basophils Relative: 1 %
EOS ABS: 350 {cells}/uL (ref 15–500)
Eosinophils Relative: 5 %
HEMATOCRIT: 51.7 % — AB (ref 38.5–50.0)
Hemoglobin: 17 g/dL (ref 13.2–17.1)
LYMPHS PCT: 17 %
Lymphs Abs: 1190 cells/uL (ref 850–3900)
MCH: 27.5 pg (ref 27.0–33.0)
MCHC: 32.9 g/dL (ref 32.0–36.0)
MCV: 83.5 fL (ref 80.0–100.0)
MONO ABS: 490 {cells}/uL (ref 200–950)
MPV: 9.3 fL (ref 7.5–12.5)
Monocytes Relative: 7 %
NEUTROS PCT: 70 %
Neutro Abs: 4900 cells/uL (ref 1500–7800)
PLATELETS: 256 10*3/uL (ref 140–400)
RBC: 6.19 MIL/uL — ABNORMAL HIGH (ref 4.20–5.80)
RDW: 14.9 % (ref 11.0–15.0)
WBC: 7 10*3/uL (ref 3.8–10.8)

## 2015-11-11 LAB — HEPATIC FUNCTION PANEL
ALBUMIN: 4.5 g/dL (ref 3.6–5.1)
ALK PHOS: 52 U/L (ref 40–115)
ALT: 39 U/L (ref 9–46)
AST: 26 U/L (ref 10–35)
Bilirubin, Direct: 0.1 mg/dL (ref <=0.2)
Indirect Bilirubin: 0.3 mg/dL (ref 0.2–1.2)
Total Bilirubin: 0.4 mg/dL (ref 0.2–1.2)
Total Protein: 7.2 g/dL (ref 6.1–8.1)

## 2015-11-11 LAB — BASIC METABOLIC PANEL WITH GFR
BUN: 19 mg/dL (ref 7–25)
CO2: 27 mmol/L (ref 20–31)
CREATININE: 1.09 mg/dL (ref 0.70–1.33)
Calcium: 9.8 mg/dL (ref 8.6–10.3)
Chloride: 103 mmol/L (ref 98–110)
GFR, EST AFRICAN AMERICAN: 88 mL/min (ref >=60)
GFR, Est Non African American: 77 mL/min (ref >=60)
GLUCOSE: 74 mg/dL (ref 65–99)
Potassium: 4.6 mmol/L (ref 3.5–5.3)
Sodium: 140 mmol/L (ref 135–146)

## 2015-11-11 LAB — LIPID PANEL
CHOLESTEROL: 130 mg/dL (ref 125–200)
HDL: 35 mg/dL — AB (ref >=40)
LDL Cholesterol: 57 mg/dL (ref <130)
Total CHOL/HDL Ratio: 3.7 Ratio (ref <=5.0)
Triglycerides: 192 mg/dL — ABNORMAL HIGH (ref <150)
VLDL: 38 mg/dL — ABNORMAL HIGH (ref <30)

## 2015-11-11 LAB — MAGNESIUM: Magnesium: 1.9 mg/dL (ref 1.5–2.5)

## 2015-11-11 LAB — TSH: TSH: 4.37 m[IU]/L (ref 0.40–4.50)

## 2015-11-11 NOTE — Progress Notes (Signed)
Assessment and Plan:  1. Hypertension -Continue medication, monitor blood pressure at home. Continue DASH diet.  Reminder to go to the ER if any CP, SOB, nausea, dizziness, severe HA, changes vision/speech, left arm numbness and tingling and jaw pain.  2. Cholesterol -Continue diet and exercise. Check cholesterol.   3. Prediabetes  -Continue diet and exercise. Check A1C  4. Vitamin D Def - check level and continue medications.   5. Hypogonadism - continue replacement therapy, check testosterone levels as needed.   6. Back pain Recent MRI with Dr. Patrice Paradise, has failed conservative treatment, + right hip/leg pain.   Continue diet and meds as discussed. Further disposition pending results of labs. Over 30 minutes of exam, counseling, chart review, and critical decision making was performed  HPI 55 y.o. male  presents for 3 month follow up on hypertension, cholesterol, prediabetes, and vitamin D deficiency.   His blood pressure has been controlled at home, today their BP is BP: 132/80  He does workout, but has not worked out.  He denies chest pain, shortness of breath, dizziness.  He is on cholesterol medication and denies myalgias. His cholesterol is at goal. The cholesterol last visit was:   Lab Results  Component Value Date   CHOL 150 07/15/2015   HDL 27 (L) 07/15/2015   LDLCALC 80 07/15/2015   TRIG 214 (H) 07/15/2015   CHOLHDL 5.6 (H) 07/15/2015  Last A1C in the office was:  Lab Results  Component Value Date   HGBA1C 5.4 07/15/2015  Patient is on Vitamin D supplement.   Lab Results  Component Value Date   VD25OH 86 07/15/2015  He has a history of testosterone deficiency and is on testosterone replacement, 2 cc q 2 weeks, had 1 week ago.Marland Kitchen He states that the testosterone helps with his energy, libido, muscle mass. Lab Results  Component Value Date   TESTOSTERONE 702 12/22/2014  BMI is Body mass index is 27.63 kg/m., he is working on diet and exercise. Wt Readings from Last  3 Encounters:  11/11/15 189 lb 12.8 oz (86.1 kg)  11/05/15 185 lb (83.9 kg)  07/15/15 191 lb 3.2 oz (86.7 kg)    Current Medications:  Current Outpatient Prescriptions on File Prior to Visit  Medication Sig Dispense Refill  . butalbital-acetaminophen-caffeine (FIORICET, ESGIC) 50-325-40 MG tablet TAKE 1 TO 2 TABLETS UP TO 4 TIMES A DAY AS NEEDED FOR HEADACHE 50 tablet 1  . cetirizine (ZYRTEC) 10 MG tablet Take 10 mg by mouth daily. Reported on 07/15/2015    . KRILL OIL PO Take by mouth daily. Reported on 07/15/2015    . lisinopril (PRINIVIL,ZESTRIL) 20 MG tablet Take 1 tablet by mouth  daily 90 tablet 1  . OMEPRAZOLE PO Take 20 mg by mouth daily. Reported on 07/15/2015    . pregabalin (LYRICA) 75 MG capsule Take 1 capsule (75 mg total) by mouth 2 (two) times daily. 180 capsule 1  . rosuvastatin (CRESTOR) 5 MG tablet Take 1 tablet by mouth  daily for cholesterol 90 tablet 1  . SYRINGE-NEEDLE, DISP, 3 ML (B-D 3CC LUER-LOK SYR 22GX1") 22G X 1" 3 ML MISC Checks sugar 1 daily for fluctuating sugars for prediabetes 50 each 3  . tadalafil (CIALIS) 5 MG tablet Take 1 tablet (5 mg total) by mouth daily as needed for erectile dysfunction. 30 tablet 11  . testosterone cypionate (DEPOTESTOSTERONE CYPIONATE) 200 MG/ML injection INJECT 2 MLS INTRAMUSCULAR EVERY 2 WEEKS AS DIRECTED 10 mL 0  . traMADol (ULTRAM) 50 MG tablet  TAKE 1 TO 2 TABLETS BY MOUTH UP TO 4 TIMES A DAY 60 tablet 0  . Triamcinolone Acetonide (NASACORT AQ NA) Place into the nose. Reported on 07/15/2015     No current facility-administered medications on file prior to visit.    Medical History:  Past Medical History:  Diagnosis Date  . Allergy   . Hyperlipidemia   . Hypertension   . OSA (obstructive sleep apnea)    Allergies: No Known Allergies   Review of Systems:  Review of Systems  Constitutional: Negative.   HENT: Negative.   Eyes: Negative.   Respiratory: Negative.   Cardiovascular: Negative.   Gastrointestinal: Negative.    Genitourinary: Negative.   Musculoskeletal: Positive for back pain. Negative for falls, joint pain, myalgias and neck pain.  Skin: Negative.   Neurological: Negative.   Endo/Heme/Allergies: Negative.   Psychiatric/Behavioral: Negative.     Family history- Review and unchanged Social history- Review and unchanged Physical Exam: BP 132/80   Pulse 81   Temp 97.7 F (36.5 C) (Temporal)   Resp 16   Ht 5' 9.5" (1.765 m)   Wt 189 lb 12.8 oz (86.1 kg)   SpO2 97%   BMI 27.63 kg/m  Wt Readings from Last 3 Encounters:  11/11/15 189 lb 12.8 oz (86.1 kg)  11/05/15 185 lb (83.9 kg)  07/15/15 191 lb 3.2 oz (86.7 kg)   General Appearance: Well nourished, in no apparent distress. Eyes: PERRLA, EOMs, conjunctiva no swelling or erythema Sinuses: No Frontal/maxillary tenderness ENT/Mouth: Ext aud canals clear, TMs without erythema, bulging. No erythema, swelling, or exudate on post pharynx.  Tonsils not swollen or erythematous. Hearing normal.  Neck: Supple, thyroid normal.  Respiratory: Respiratory effort normal, BS equal bilaterally without rales, rhonchi, wheezing or stridor.  Cardio: RRR with no MRGs. Brisk peripheral pulses without edema.  Abdomen: Soft, + BS,  Non tender, no guarding, rebound, hernias, masses. Lymphatics: Non tender without lymphadenopathy.  Musculoskeletal: Full ROM, 5/5 strength,  Skin: Warm, dry without rashes, lesions, ecchymosis.  Neuro: Cranial nerves intact. Normal muscle tone, no cerebellar symptoms. Psych: Awake and oriented X 3, normal affect, Insight and Judgment appropriate.    Vicie Mutters, PA-C 9:03 AM [facility]

## 2015-11-11 NOTE — Patient Instructions (Signed)

## 2015-11-12 LAB — VITAMIN D 25 HYDROXY (VIT D DEFICIENCY, FRACTURES): Vit D, 25-Hydroxy: 98 ng/mL (ref 30–100)

## 2015-11-30 ENCOUNTER — Other Ambulatory Visit: Payer: Self-pay | Admitting: Internal Medicine

## 2015-11-30 ENCOUNTER — Other Ambulatory Visit: Payer: Self-pay | Admitting: *Deleted

## 2015-11-30 ENCOUNTER — Encounter: Payer: Self-pay | Admitting: Internal Medicine

## 2015-11-30 MED ORDER — TESTOSTERONE CYPIONATE 200 MG/ML IM SOLN: INTRAMUSCULAR | 0 refills | Status: DC

## 2016-01-14 ENCOUNTER — Encounter: Payer: Self-pay | Admitting: Internal Medicine

## 2016-01-19 ENCOUNTER — Other Ambulatory Visit: Payer: Self-pay | Admitting: Internal Medicine

## 2016-01-19 NOTE — Telephone Encounter (Signed)
Rx called into [facility]. 

## 2016-01-29 ENCOUNTER — Ambulatory Visit (INDEPENDENT_AMBULATORY_CARE_PROVIDER_SITE_OTHER): Payer: 59 | Admitting: Internal Medicine

## 2016-01-29 VITALS — BP 118/86 | HR 72 | Temp 97.5°F | Resp 16 | Ht 69.0 in | Wt 184.8 lb

## 2016-01-29 DIAGNOSIS — Z0000 Encounter for general adult medical examination without abnormal findings: Secondary | ICD-10-CM | POA: Diagnosis not present

## 2016-01-29 DIAGNOSIS — Z23 Encounter for immunization: Secondary | ICD-10-CM

## 2016-01-29 DIAGNOSIS — Z125 Encounter for screening for malignant neoplasm of prostate: Secondary | ICD-10-CM

## 2016-01-29 DIAGNOSIS — R5383 Other fatigue: Secondary | ICD-10-CM

## 2016-01-29 DIAGNOSIS — L237 Allergic contact dermatitis due to plants, except food: Secondary | ICD-10-CM

## 2016-01-29 DIAGNOSIS — R972 Elevated prostate specific antigen [PSA]: Secondary | ICD-10-CM

## 2016-01-29 DIAGNOSIS — Z0001 Encounter for general adult medical examination with abnormal findings: Secondary | ICD-10-CM

## 2016-01-29 DIAGNOSIS — Z111 Encounter for screening for respiratory tuberculosis: Secondary | ICD-10-CM

## 2016-01-29 DIAGNOSIS — G4733 Obstructive sleep apnea (adult) (pediatric): Secondary | ICD-10-CM

## 2016-01-29 DIAGNOSIS — E559 Vitamin D deficiency, unspecified: Secondary | ICD-10-CM

## 2016-01-29 DIAGNOSIS — Z136 Encounter for screening for cardiovascular disorders: Secondary | ICD-10-CM | POA: Diagnosis not present

## 2016-01-29 DIAGNOSIS — I10 Essential (primary) hypertension: Secondary | ICD-10-CM

## 2016-01-29 DIAGNOSIS — M1711 Unilateral primary osteoarthritis, right knee: Secondary | ICD-10-CM

## 2016-01-29 DIAGNOSIS — E782 Mixed hyperlipidemia: Secondary | ICD-10-CM

## 2016-01-29 DIAGNOSIS — E8881 Metabolic syndrome: Secondary | ICD-10-CM

## 2016-01-29 DIAGNOSIS — E349 Endocrine disorder, unspecified: Secondary | ICD-10-CM

## 2016-01-29 DIAGNOSIS — Z1212 Encounter for screening for malignant neoplasm of rectum: Secondary | ICD-10-CM

## 2016-01-29 DIAGNOSIS — Z79899 Other long term (current) drug therapy: Secondary | ICD-10-CM

## 2016-01-29 LAB — CBC WITH DIFFERENTIAL/PLATELET
BASOS PCT: 0 %
Basophils Absolute: 0 cells/uL (ref 0–200)
EOS ABS: 222 {cells}/uL (ref 15–500)
Eosinophils Relative: 2 %
HEMATOCRIT: 50.3 % — AB (ref 38.5–50.0)
Hemoglobin: 16.8 g/dL (ref 13.2–17.1)
LYMPHS ABS: 1665 {cells}/uL (ref 850–3900)
Lymphocytes Relative: 15 %
MCH: 28.1 pg (ref 27.0–33.0)
MCHC: 33.4 g/dL (ref 32.0–36.0)
MCV: 84.3 fL (ref 80.0–100.0)
MONO ABS: 777 {cells}/uL (ref 200–950)
MPV: 9.2 fL (ref 7.5–12.5)
Monocytes Relative: 7 %
NEUTROS ABS: 8436 {cells}/uL — AB (ref 1500–7800)
Neutrophils Relative %: 76 %
PLATELETS: 297 10*3/uL (ref 140–400)
RBC: 5.97 MIL/uL — ABNORMAL HIGH (ref 4.20–5.80)
RDW: 15 % (ref 11.0–15.0)
WBC: 11.1 10*3/uL — ABNORMAL HIGH (ref 3.8–10.8)

## 2016-01-29 LAB — HEPATIC FUNCTION PANEL
ALBUMIN: 4.5 g/dL (ref 3.6–5.1)
ALK PHOS: 44 U/L (ref 40–115)
ALT: 35 U/L (ref 9–46)
AST: 26 U/L (ref 10–35)
BILIRUBIN INDIRECT: 0.6 mg/dL (ref 0.2–1.2)
BILIRUBIN TOTAL: 0.7 mg/dL (ref 0.2–1.2)
Bilirubin, Direct: 0.1 mg/dL (ref <=0.2)
Total Protein: 6.9 g/dL (ref 6.1–8.1)

## 2016-01-29 LAB — LIPID PANEL
Cholesterol: 126 mg/dL (ref 125–200)
HDL: 37 mg/dL — AB (ref >=40)
LDL CALC: 67 mg/dL (ref <130)
Total CHOL/HDL Ratio: 3.4 Ratio (ref <=5.0)
Triglycerides: 110 mg/dL (ref <150)
VLDL: 22 mg/dL (ref <30)

## 2016-01-29 LAB — HEMOGLOBIN A1C
Hgb A1c MFr Bld: 5.1 % (ref <5.7)
Mean Plasma Glucose: 100 mg/dL

## 2016-01-29 LAB — IRON AND TIBC
%SAT: 15 % (ref 15–60)
IRON: 50 ug/dL (ref 50–180)
TIBC: 331 ug/dL (ref 250–425)
UIBC: 281 ug/dL (ref 125–400)

## 2016-01-29 LAB — BASIC METABOLIC PANEL WITH GFR
BUN: 19 mg/dL (ref 7–25)
CHLORIDE: 101 mmol/L (ref 98–110)
CO2: 26 mmol/L (ref 20–31)
Calcium: 9.7 mg/dL (ref 8.6–10.3)
Creat: 1.09 mg/dL (ref 0.70–1.33)
GFR, EST NON AFRICAN AMERICAN: 77 mL/min (ref >=60)
GFR, Est African American: 88 mL/min (ref >=60)
GLUCOSE: 88 mg/dL (ref 65–99)
POTASSIUM: 4.1 mmol/L (ref 3.5–5.3)
Sodium: 140 mmol/L (ref 135–146)

## 2016-01-29 LAB — PSA: PSA: 3.4 ng/mL (ref <=4.0)

## 2016-01-29 LAB — TSH: TSH: 2.06 m[IU]/L (ref 0.40–4.50)

## 2016-01-29 LAB — VITAMIN B12: VITAMIN B 12: 633 pg/mL (ref 200–1100)

## 2016-01-29 LAB — MAGNESIUM: Magnesium: 2 mg/dL (ref 1.5–2.5)

## 2016-01-29 MED ORDER — PREDNISONE 20 MG PO TABS: ORAL_TABLET | ORAL | 2 refills | Status: DC

## 2016-01-29 NOTE — Progress Notes (Signed)
[facility]   [name], M.D.    Uvaldo Bristle. Silverio Lay, P.A.-C      Starlyn Skeans, P.A.-C  [facility]                [address] [place] [id number] Telephone [phone] Telefax [phone] Annual  Screening/Preventative Visit  & Comprehensive Evaluation & Examination     This very nice 55 y.o. MWM presents for a Screening/Preventative Visit & comprehensive evaluation and management of multiple medical co-morbidities.  Patient has been followed for HTN, Prediabetes, Hyperlipidemia and Vitamin D Deficiency. Also , patient has hx/o OSA and is on CPAP reporting improved sleep hygiene and no significant daytime excessive sleepiness.     HTN predates circa 2007. Patient's BP has been controlled at home.Today's BP is 118/86. Patient denies any cardiac symptoms as chest pain, palpitations, shortness of breath, dizziness or ankle swelling.     Patient's hyperlipidemia is controlled with diet and medications. Patient denies myalgias or other medication SE's. Last lipids were at goal albeit sl elev Trig's: Lab Results  Component Value Date   CHOL 130 11/11/2015   HDL 35 (L) 11/11/2015   LDLCALC 57 11/11/2015   TRIG 192 (H) 11/11/2015   CHOLHDL 3.7 11/11/2015      Patient has prediabetes/Insulin Resistance  In 2015 with A1c 5.4% and elevated insulin 45. patient denies reactive hypoglycemic symptoms, visual blurring, diabetic polys or paresthesias.  Last A1c was at goal A1c 5.4%, but elevated Insulin 49.4 In Apr 2017.      Patient has hx/o Testosterone Deficiency and is on parenteral replacement. Finally, patient has history of Vitamin D Deficiency and last vitamin D was at goal: Lab Results  Component Value Date   VD25OH 98 11/11/2015   Current Outpatient Prescriptions on File Prior to Visit  Medication Sig  . butalbital-acetaminophen-caffeine (FIORICET, ESGIC) 50-325-40 MG tablet TAKE 1 TO 2  TABLETS UP TO 4 TIMES A DAY AS NEEDED FOR HEADACHE  . cetirizine (ZYRTEC) 10 MG tablet Take 10 mg by mouth daily. Reported on 07/15/2015  . KRILL OIL PO Take by mouth daily. Reported on 07/15/2015  . lisinopril (PRINIVIL,ZESTRIL) 20 MG tablet Take 1 tablet by mouth  daily  . OMEPRAZOLE PO Take 20 mg by mouth daily. Reported on 07/15/2015  . pregabalin (LYRICA) 75 MG capsule Take 1 capsule (75 mg total) by mouth 2 (two) times daily.  . rosuvastatin (CRESTOR) 5 MG tablet Take 1 tablet by mouth  daily for cholesterol  . SYRINGE-NEEDLE, DISP, 3 ML (B-D 3CC LUER-LOK SYR 22GX1") 22G X 1" 3 ML MISC Checks sugar 1 daily for fluctuating sugars for prediabetes  . tadalafil (CIALIS) 5 MG tablet Take 1 tablet (5 mg total) by mouth daily as needed for erectile dysfunction.  Marland Kitchen testosterone cypionate (DEPOTESTOSTERONE CYPIONATE) 200 MG/ML injection INJECT 2 ML IN THE MUSCLE EVERY 2 WEEKS  . traMADol (ULTRAM) 50 MG tablet TAKE 1 TO 2 TABLETS BY MOUTH UP TO 4 TIMES A DAY  . Triamcinolone Acetonide (NASACORT AQ NA) Place into the nose. Reported on 07/15/2015   No current facility-administered medications on file prior to visit.    No Known Allergies Past Medical History:  Diagnosis Date  . Allergy   . Hyperlipidemia   . Hypertension   . OSA (obstructive sleep apnea)    Health Maintenance  Topic Date Due  . INFLUENZA  VACCINE  11/10/2015  . COLONOSCOPY  03/11/2022  . TETANUS/TDAP  10/26/2023  . Hepatitis C Screening  Completed  . HIV Screening  Completed   Immunization History  Administered Date(s) Administered  . PPD Test 10/25/2013, 12/22/2014  . Pneumococcal Polysaccharide-23 12/22/2014  . Tdap 10/25/2013   Past Surgical History:  Procedure Laterality Date  . APPENDECTOMY  1983  . HAND SURGERY Right 2007   Family History  Problem Relation Age of Onset  . Cancer Father     SCC- head and neck    Social History   Social History  . Marital status: Married    Spouse name: N/A  . Number of  children: N/A  . Years of education: N/A   Occupational History  . Not on file.   Social History Main Topics  . Smoking status: Never Smoker  . Smokeless tobacco: Not on file  . Alcohol use 1.0 oz/week    2 drink(s) per week  . Drug use: No  . Sexual activity: Not on file    ROS Constitutional: Denies fever, chills, weight loss/gain, headaches, insomnia,  night sweats or change in appetite. Does c/o fatigue. Eyes: Denies redness, blurred vision, diplopia, discharge, itchy or watery eyes.  ENT: Denies discharge, congestion, post nasal drip, epistaxis, sore throat, earache, hearing loss, dental pain, Tinnitus, Vertigo, Sinus pain or snoring.  Cardio: Denies chest pain, palpitations, irregular heartbeat, syncope, dyspnea, diaphoresis, orthopnea, PND, claudication or edema Respiratory: denies cough, dyspnea, DOE, pleurisy, hoarseness, laryngitis or wheezing.  Gastrointestinal: Denies dysphagia, heartburn, reflux, water brash, pain, cramps, nausea, vomiting, bloating, diarrhea, constipation, hematemesis, melena, hematochezia, jaundice or hemorrhoids Genitourinary: Denies dysuria, frequency, urgency, nocturia, hesitancy, discharge, hematuria or flank pain Musculoskeletal: Denies arthralgia, myalgia, stiffness, Jt. Swelling, pain, limp or strain/sprain. Denies Falls. Skin: Denies puritis, rash, hives, warts, acne, eczema or change in skin lesion Neuro: No weakness, tremor, incoordination, spasms, paresthesia or pain Psychiatric: Denies confusion, memory loss or sensory loss. Denies Depression. Endocrine: Denies change in weight, skin, hair change, nocturia, and paresthesia, diabetic polys, visual blurring or hyper / hypo glycemic episodes.  Heme/Lymph: No excessive bleeding, bruising or enlarged lymph nodes.  Physical Exam  BP 118/86   Pulse 72   Temp 97.5 F (36.4 C)   Resp 16   Ht 5\' 9"  (1.753 m)   Wt 184 lb 12.8 oz (83.8 kg)   BMI 27.29 kg/m   General Appearance: Well nourished,  in no apparent distress.  Eyes: PERRLA, EOMs, conjunctiva no swelling or erythema, normal fundi and vessels. Sinuses: No frontal/maxillary tenderness ENT/Mouth: EACs patent / TMs  nl. Nares clear without erythema, swelling, mucoid exudates. Oral hygiene is good. No erythema, swelling, or exudate. Tongue normal, non-obstructing. Tonsils not swollen or erythematous. Hearing normal.  Neck: Supple, thyroid normal. No bruits, nodes or JVD. Respiratory: Respiratory effort normal.  BS equal and clear bilateral without rales, rhonci, wheezing or stridor. Cardio: Heart sounds are normal with regular rate and rhythm and no murmurs, rubs or gallops. Peripheral pulses are normal and equal bilaterally without edema. No aortic or femoral bruits. Chest: symmetric with normal excursions and percussion.  Abdomen: Soft, with Nl bowel sounds. Nontender, no guarding, rebound, hernias, masses, or organomegaly.  Lymphatics: Non tender without lymphadenopathy.  Genitourinary: No hernias.Testes nl. DRE - prostate nl for age - smooth & firm w/o nodules. Musculoskeletal: Full ROM all peripheral extremities, joint stability, 5/5 strength, and normal gait. Skin: Warm and dry without rashes, lesions, cyanosis, clubbing or  ecchymosis.  Neuro: Cranial nerves  intact, reflexes equal bilaterally. Normal muscle tone, no cerebellar symptoms. Sensation intact.  Pysch: Alert and oriented X 3 with normal affect, insight and judgment appropriate.   Assessment and Plan  1. Annual Preventative/Screening Exam   - Microalbumin / creatinine urine ratio - EKG 12-Lead - [place], RETROPERITNL ABD,  LTD - POC Hemoccult Bld/Stl  - Urinalysis, Routine w reflex microscopic  - Vitamin B12 - Iron and TIBC - PSA - Testosterone - CBC with Differential/Platelet - BASIC METABOLIC PANEL WITH GFR - Hepatic function panel - Magnesium - Lipid panel - TSH - Hemoglobin A1c - Insulin, random - VITAMIN D 25 Hydroxy  2. Essential  hypertension  - Microalbumin / creatinine urine ratio - EKG 12-Lead - [place], RETROPERITNL ABD,  LTD - TSH  3. Mixed hyperlipidemia  - Lipid panel - TSH  4. Insulin resistance / Prediabetes  - Hemoglobin A1c - Insulin, random  5. Vitamin D deficiency  - VITAMIN D 25 Hydroxy   6. Testosterone deficiency  - Testosterone  7. Elevated PSA  - PSA  8. OSA (obstructive sleep apnea)   9. Screening for rectal cancer  - POC Hemoccult Bld/Stl   10. Prostate cancer screening  - PSA  11. Screening for ischemic heart disease   12. Screening for AAA (aortic abdominal aneurysm)   13. Other fatigue  - Vitamin B12 - Iron and TIBC - Testosterone - CBC with Differential/Platelet - TSH  14. Medication management  - Urinalysis, Routine w reflex microscopic - CBC with Differential/Platelet - BASIC METABOLIC PANEL WITH GFR - Hepatic function panel - Magnesium  15. Need for prophylactic vaccination and inoculation against influenza  - Flu Vaccine QUAD with presevative  16. Screening examination for pulmonary tuberculosis  - PPD      Continue prudent diet as discussed, weight control, BP monitoring, regular exercise, and medications as discussed.  Discussed med effects and SE's. Routine screening labs and tests as requested with regular follow-up as recommended. Over 40 minutes of exam, counseling, chart review and high complex critical decision making was performed

## 2016-01-29 NOTE — Patient Instructions (Signed)

## 2016-01-30 LAB — URINALYSIS, ROUTINE W REFLEX MICROSCOPIC
Bilirubin Urine: NEGATIVE
Glucose, UA: NEGATIVE
HGB URINE DIPSTICK: NEGATIVE
KETONES UR: NEGATIVE
Leukocytes, UA: NEGATIVE
NITRITE: NEGATIVE
PROTEIN: NEGATIVE
SPECIFIC GRAVITY, URINE: 1.012 (ref 1.001–1.035)
pH: 6 (ref 5.0–8.0)

## 2016-01-30 LAB — INSULIN, RANDOM: INSULIN: 37 u[IU]/mL — AB (ref 2.0–19.6)

## 2016-01-30 LAB — MICROALBUMIN / CREATININE URINE RATIO
CREATININE, URINE: 74 mg/dL (ref 20–370)
MICROALB UR: 0.4 mg/dL
Microalb Creat Ratio: 5 mcg/mg creat (ref <30)

## 2016-01-30 LAB — TESTOSTERONE: Testosterone: 347 ng/dL (ref 250–827)

## 2016-01-30 LAB — VITAMIN D 25 HYDROXY (VIT D DEFICIENCY, FRACTURES): Vit D, 25-Hydroxy: 93 ng/mL (ref 30–100)

## 2016-01-31 ENCOUNTER — Encounter: Payer: Self-pay | Admitting: Internal Medicine

## 2016-02-01 ENCOUNTER — Encounter: Payer: Self-pay | Admitting: Internal Medicine

## 2016-02-01 LAB — TB SKIN TEST
Induration: 0 mm
TB SKIN TEST: NEGATIVE

## 2016-02-07 ENCOUNTER — Other Ambulatory Visit: Payer: Self-pay | Admitting: Internal Medicine

## 2016-02-11 ENCOUNTER — Other Ambulatory Visit: Payer: Self-pay | Admitting: Internal Medicine

## 2016-03-14 ENCOUNTER — Other Ambulatory Visit: Payer: Self-pay | Admitting: Physician Assistant

## 2016-03-14 NOTE — Telephone Encounter (Signed)
Please call Depo - Testosterone 

## 2016-04-20 ENCOUNTER — Ambulatory Visit (INDEPENDENT_AMBULATORY_CARE_PROVIDER_SITE_OTHER): Payer: 59 | Admitting: Internal Medicine

## 2016-04-20 ENCOUNTER — Encounter: Payer: Self-pay | Admitting: Internal Medicine

## 2016-04-20 ENCOUNTER — Ambulatory Visit (HOSPITAL_COMMUNITY)
Admission: RE | Admit: 2016-04-20 | Discharge: 2016-04-20 | Disposition: A | Discharge status: Home / Self Care | Payer: 59 | Source: Ambulatory Visit | Attending: Internal Medicine | Admitting: Internal Medicine

## 2016-04-20 VITALS — BP 124/84 | HR 86 | Temp 98.2°F | Resp 16 | Ht 69.0 in | Wt 186.0 lb

## 2016-04-20 DIAGNOSIS — M5136 Other intervertebral disc degeneration, lumbar region: Secondary | ICD-10-CM

## 2016-04-20 DIAGNOSIS — I10 Essential (primary) hypertension: Secondary | ICD-10-CM | POA: Insufficient documentation

## 2016-04-20 DIAGNOSIS — Z01818 Encounter for other preprocedural examination: Secondary | ICD-10-CM

## 2016-04-20 DIAGNOSIS — E782 Mixed hyperlipidemia: Secondary | ICD-10-CM | POA: Diagnosis not present

## 2016-04-20 LAB — CBC WITH DIFFERENTIAL/PLATELET
BASOS PCT: 1 %
Basophils Absolute: 60 cells/uL (ref 0–200)
EOS PCT: 6 %
Eosinophils Absolute: 360 cells/uL (ref 15–500)
HCT: 49.4 % (ref 38.5–50.0)
Hemoglobin: 16.1 g/dL (ref 13.2–17.1)
Lymphocytes Relative: 19 %
Lymphs Abs: 1140 cells/uL (ref 850–3900)
MCH: 27.2 pg (ref 27.0–33.0)
MCHC: 32.6 g/dL (ref 32.0–36.0)
MCV: 83.4 fL (ref 80.0–100.0)
MONOS PCT: 8 %
MPV: 9 fL (ref 7.5–12.5)
Monocytes Absolute: 480 cells/uL (ref 200–950)
NEUTROS ABS: 3960 {cells}/uL (ref 1500–7800)
Neutrophils Relative %: 66 %
PLATELETS: 303 10*3/uL (ref 140–400)
RBC: 5.92 MIL/uL — ABNORMAL HIGH (ref 4.20–5.80)
RDW: 14.1 % (ref 11.0–15.0)
WBC: 6 10*3/uL (ref 3.8–10.8)

## 2016-04-20 LAB — COMPREHENSIVE METABOLIC PANEL
ALBUMIN: 4.1 g/dL (ref 3.6–5.1)
ALK PHOS: 47 U/L (ref 40–115)
ALT: 32 U/L (ref 9–46)
AST: 26 U/L (ref 10–35)
BILIRUBIN TOTAL: 0.4 mg/dL (ref 0.2–1.2)
BUN: 20 mg/dL (ref 7–25)
CO2: 29 mmol/L (ref 20–31)
Calcium: 9.4 mg/dL (ref 8.6–10.3)
Chloride: 106 mmol/L (ref 98–110)
Creat: 1.04 mg/dL (ref 0.70–1.33)
Glucose, Bld: 87 mg/dL (ref 65–99)
Potassium: 4.5 mmol/L (ref 3.5–5.3)
Sodium: 141 mmol/L (ref 135–146)
TOTAL PROTEIN: 6.7 g/dL (ref 6.1–8.1)

## 2016-04-20 LAB — PROTIME-INR
INR: 1
PROTHROMBIN TIME: 11 s (ref 9.0–11.5)

## 2016-04-20 NOTE — Progress Notes (Signed)
   Subjective:    Patient ID: Oscar Moore, male    DOB: Sep 06, 1960, 56 y.o.   MRN: [id number]  HPI  Patient presents to the office for evaluation of preoperative clearance.  He reports that [facility] month to month and a half.  He reports that he is likely going to have some decompression.  He has not had any issues with CP, SOB, wheezing.  He is a never smoker.  He is doing okay with medications.  He has been doing well with his blood pressure at home.  No family history of malignant hyperthermia.  No complaints today otherwise.     Review of Systems  Constitutional: Negative for chills, fatigue and fever.  Respiratory: Negative for chest tightness and shortness of breath.   Cardiovascular: Negative for chest pain, palpitations and leg swelling.  Gastrointestinal: Negative for abdominal pain, diarrhea, nausea and vomiting.  Genitourinary: Negative for dysuria, frequency, hematuria and urgency.  Neurological: Negative for facial asymmetry and headaches.       Objective:   Physical Exam  Constitutional: He is oriented to person, place, and time. He appears well-developed and well-nourished. No distress.  HENT:  Head: Normocephalic.  Mouth/Throat: Oropharynx is clear and moist. No oropharyngeal exudate.  Eyes: Conjunctivae are normal. No scleral icterus.  Neck: Normal range of motion. Neck supple. No JVD present. No thyromegaly present.  Cardiovascular: Normal rate, regular rhythm, normal heart sounds and intact distal pulses.  Exam reveals no gallop and no friction rub.   No murmur heard. Pulmonary/Chest: Effort normal and breath sounds normal. No respiratory distress. He has no wheezes. He has no rales. He exhibits no tenderness.  Abdominal: Soft. Bowel sounds are normal. He exhibits no distension and no mass. There is no tenderness. There is no rebound and no guarding.  Musculoskeletal: Normal range of motion.   Lymphadenopathy:    He has no cervical adenopathy.  Neurological: He is alert and oriented to person, place, and time.  Skin: Skin is warm and dry. He is not diaphoretic.  Psychiatric: He has a normal mood and affect. His behavior is normal. Judgment and thought content normal.  Nursing note and vitals reviewed.   Vitals:   04/20/16 0841  BP: 124/84  Pulse: 86  Resp: 16  Temp: 98.2 F (36.8 C)         Assessment & Plan:    1. Essential hypertension -Well controlled -DASH diet -cont meds -monitor at home  2. Mixed hyperlipidemia -well controlled with low dose crestor  3. DDD (degenerative disc disease), lumbar -[facility]  4. Preoperative clearance -from clearance for surgery feel that the patient is low risk.  Will clear for surgery if normal labs.  EKGs from 2016 and 2017 reviewed and were normal.   -chest xray is normal. - Comprehensive metabolic panel - CBC with Differential/Platelet - Protime-INR - DG Chest 2 View; Future

## 2016-05-02 ENCOUNTER — Other Ambulatory Visit: Payer: Self-pay | Admitting: Internal Medicine

## 2016-05-02 DIAGNOSIS — G44229 Chronic tension-type headache, not intractable: Secondary | ICD-10-CM

## 2016-05-02 NOTE — Telephone Encounter (Signed)
Please call Fioricet & Tramadol

## 2016-05-12 ENCOUNTER — Ambulatory Visit: Payer: Self-pay | Admitting: Internal Medicine

## 2016-05-16 DIAGNOSIS — I10 Essential (primary) hypertension: Secondary | ICD-10-CM | POA: Diagnosis not present

## 2016-05-16 DIAGNOSIS — M79652 Pain in left thigh: Secondary | ICD-10-CM | POA: Diagnosis not present

## 2016-05-16 DIAGNOSIS — G4730 Sleep apnea, unspecified: Secondary | ICD-10-CM | POA: Diagnosis not present

## 2016-05-16 DIAGNOSIS — M47816 Spondylosis without myelopathy or radiculopathy, lumbar region: Secondary | ICD-10-CM | POA: Diagnosis not present

## 2016-05-16 DIAGNOSIS — M545 Low back pain: Secondary | ICD-10-CM | POA: Diagnosis not present

## 2016-05-16 DIAGNOSIS — M5126 Other intervertebral disc displacement, lumbar region: Secondary | ICD-10-CM | POA: Diagnosis not present

## 2016-05-16 DIAGNOSIS — M5416 Radiculopathy, lumbar region: Secondary | ICD-10-CM | POA: Diagnosis not present

## 2016-05-16 DIAGNOSIS — Z01818 Encounter for other preprocedural examination: Secondary | ICD-10-CM | POA: Diagnosis not present

## 2016-05-16 DIAGNOSIS — M48061 Spinal stenosis, lumbar region without neurogenic claudication: Secondary | ICD-10-CM | POA: Diagnosis not present

## 2016-05-16 DIAGNOSIS — Z01810 Encounter for preprocedural cardiovascular examination: Secondary | ICD-10-CM | POA: Diagnosis not present

## 2016-05-16 DIAGNOSIS — M5136 Other intervertebral disc degeneration, lumbar region: Secondary | ICD-10-CM | POA: Diagnosis not present

## 2016-05-16 DIAGNOSIS — M79651 Pain in right thigh: Secondary | ICD-10-CM | POA: Diagnosis not present

## 2016-05-16 DIAGNOSIS — Z01812 Encounter for preprocedural laboratory examination: Secondary | ICD-10-CM | POA: Diagnosis not present

## 2016-05-17 DIAGNOSIS — M5126 Other intervertebral disc displacement, lumbar region: Secondary | ICD-10-CM | POA: Diagnosis not present

## 2016-05-17 DIAGNOSIS — M5116 Intervertebral disc disorders with radiculopathy, lumbar region: Secondary | ICD-10-CM | POA: Diagnosis not present

## 2016-05-17 DIAGNOSIS — M48061 Spinal stenosis, lumbar region without neurogenic claudication: Secondary | ICD-10-CM | POA: Diagnosis not present

## 2016-05-17 DIAGNOSIS — M5416 Radiculopathy, lumbar region: Secondary | ICD-10-CM | POA: Diagnosis not present

## 2016-05-17 DIAGNOSIS — Z01818 Encounter for other preprocedural examination: Secondary | ICD-10-CM | POA: Diagnosis not present

## 2016-05-18 DIAGNOSIS — M4726 Other spondylosis with radiculopathy, lumbar region: Secondary | ICD-10-CM | POA: Diagnosis not present

## 2016-05-18 DIAGNOSIS — M48061 Spinal stenosis, lumbar region without neurogenic claudication: Secondary | ICD-10-CM | POA: Diagnosis not present

## 2016-05-18 DIAGNOSIS — M5116 Intervertebral disc disorders with radiculopathy, lumbar region: Secondary | ICD-10-CM | POA: Diagnosis not present

## 2016-05-31 ENCOUNTER — Encounter: Payer: Self-pay | Admitting: Internal Medicine

## 2016-05-31 ENCOUNTER — Ambulatory Visit (INDEPENDENT_AMBULATORY_CARE_PROVIDER_SITE_OTHER): Payer: 59 | Admitting: Internal Medicine

## 2016-05-31 VITALS — BP 120/78 | HR 80 | Temp 98.0°F | Resp 16 | Ht 69.0 in | Wt 185.0 lb

## 2016-05-31 DIAGNOSIS — E8881 Metabolic syndrome: Secondary | ICD-10-CM

## 2016-05-31 DIAGNOSIS — Z79899 Other long term (current) drug therapy: Secondary | ICD-10-CM | POA: Diagnosis not present

## 2016-05-31 DIAGNOSIS — I10 Essential (primary) hypertension: Secondary | ICD-10-CM | POA: Diagnosis not present

## 2016-05-31 DIAGNOSIS — E559 Vitamin D deficiency, unspecified: Secondary | ICD-10-CM

## 2016-05-31 DIAGNOSIS — E782 Mixed hyperlipidemia: Secondary | ICD-10-CM

## 2016-05-31 DIAGNOSIS — E349 Endocrine disorder, unspecified: Secondary | ICD-10-CM

## 2016-05-31 DIAGNOSIS — G43009 Migraine without aura, not intractable, without status migrainosus: Secondary | ICD-10-CM | POA: Diagnosis not present

## 2016-05-31 LAB — CBC WITH DIFFERENTIAL/PLATELET
BASOS ABS: 76 {cells}/uL (ref 0–200)
Basophils Relative: 1 %
EOS PCT: 6 %
Eosinophils Absolute: 456 cells/uL (ref 15–500)
HEMATOCRIT: 50.4 % — AB (ref 38.5–50.0)
HEMOGLOBIN: 16.6 g/dL (ref 13.2–17.1)
LYMPHS ABS: 1368 {cells}/uL (ref 850–3900)
LYMPHS PCT: 18 %
MCH: 27.1 pg (ref 27.0–33.0)
MCHC: 32.9 g/dL (ref 32.0–36.0)
MCV: 82.4 fL (ref 80.0–100.0)
MPV: 9.3 fL (ref 7.5–12.5)
Monocytes Absolute: 456 cells/uL (ref 200–950)
Monocytes Relative: 6 %
NEUTROS PCT: 69 %
Neutro Abs: 5244 cells/uL (ref 1500–7800)
Platelets: 261 10*3/uL (ref 140–400)
RBC: 6.12 MIL/uL — ABNORMAL HIGH (ref 4.20–5.80)
RDW: 15.1 % — AB (ref 11.0–15.0)
WBC: 7.6 10*3/uL (ref 3.8–10.8)

## 2016-05-31 LAB — LIPID PANEL
CHOL/HDL RATIO: 3.7 ratio (ref <5.0)
CHOLESTEROL: 164 mg/dL (ref <200)
HDL: 44 mg/dL (ref >40)
LDL CALC: 88 mg/dL (ref <100)
TRIGLYCERIDES: 159 mg/dL — AB (ref <150)
VLDL: 32 mg/dL — AB (ref <30)

## 2016-05-31 LAB — BASIC METABOLIC PANEL WITH GFR
BUN: 17 mg/dL (ref 7–25)
CALCIUM: 9.8 mg/dL (ref 8.6–10.3)
CO2: 30 mmol/L (ref 20–31)
Chloride: 101 mmol/L (ref 98–110)
Creat: 1.2 mg/dL (ref 0.70–1.33)
GFR, EST AFRICAN AMERICAN: 78 mL/min (ref >=60)
GFR, Est Non African American: 68 mL/min (ref >=60)
GLUCOSE: 64 mg/dL — AB (ref 65–99)
Potassium: 4.2 mmol/L (ref 3.5–5.3)
Sodium: 141 mmol/L (ref 135–146)

## 2016-05-31 LAB — HEPATIC FUNCTION PANEL
ALK PHOS: 50 U/L (ref 40–115)
ALT: 48 U/L — ABNORMAL HIGH (ref 9–46)
AST: 32 U/L (ref 10–35)
Albumin: 4.5 g/dL (ref 3.6–5.1)
BILIRUBIN DIRECT: 0.1 mg/dL (ref <=0.2)
BILIRUBIN INDIRECT: 0.4 mg/dL (ref 0.2–1.2)
Total Bilirubin: 0.5 mg/dL (ref 0.2–1.2)
Total Protein: 7 g/dL (ref 6.1–8.1)

## 2016-05-31 NOTE — Progress Notes (Signed)
Assessment and Plan:  Hypertension:  -Continue medication,  -monitor blood pressure at home.  -Continue DASH diet.   -Reminder to go to the ER if any CP, SOB, nausea, dizziness, severe HA, changes vision/speech, left arm numbness and tingling, and jaw pain.  Cholesterol: -Continue diet and exercise.  -Check cholesterol.   Pre-diabetes: -Continue diet and exercise.  -Check A1C  Vitamin D Def: -check level -continue medications.   Recent Discectomy  -surgical site clean and dry -dressing removed -incision nearly completely healed -patient aware no direct soaking in bath or pool for 1-2 more weeks -healing well  Migraines -well controlled currently  Testosterone Deficiency -due for injection -testosterone level -continue and dose adjust as necessary  Continue diet and meds as discussed. Further disposition pending results of labs.  HPI 56 y.o. male  presents for 3 month follow up with hypertension, hyperlipidemia, prediabetes and vitamin D.   His blood pressure has been controlled at home, today their BP is BP: 120/78.   He does not workout. He denies chest pain, shortness of breath, dizziness.  He is currently recovering from back surgery.     He is not on cholesterol medication and denies myalgias. His cholesterol is at goal. The cholesterol last visit was:   Lab Results  Component Value Date   CHOL 126 01/29/2016   HDL 37 (L) 01/29/2016   LDLCALC 67 01/29/2016   TRIG 110 01/29/2016   CHOLHDL 3.4 01/29/2016     He has been working on diet and exercise for prediabetes, and denies foot ulcerations, hyperglycemia, hypoglycemia , increased appetite, nausea, paresthesia of the feet, polydipsia, polyuria, visual disturbances, vomiting and weight loss. Last A1C in the office was:  Lab Results  Component Value Date   HGBA1C 5.1 01/29/2016    Patient is on Vitamin D supplement.  Lab Results  Component Value Date   VD25OH 14 01/29/2016     He reports that he did go  down to the [facility] in [place] and had his back opertated on.  He reports that he is feeling much better.  He is no longer having any pain shooting down his leg.  He had a discectomy.  He reports that they decompressed the foramen and also did a nerve root ablation.  He reports that he was told to let us remove the steri strips.  He has been wearing a tegaderm patch and he has been able to bath normally.     He is due for a testsoterone shot.  He reports that he is actually a couple days ago.  He reports that the shots are helping with fatigue.    Headaches have been doing well.  Only getting them occasionally.  Not nearly as often as before in the past.    Current Medications:  Current Outpatient Prescriptions on File Prior to Visit  Medication Sig Dispense Refill  . butalbital-acetaminophen-caffeine (FIORICET, ESGIC) 50-325-40 MG tablet TAKE 1 TO 2 TABLETS UP TO 4 TIMES A DAY 50 tablet 1  . cetirizine (ZYRTEC) 10 MG tablet Take 10 mg by mouth daily. Reported on 07/15/2015    . KRILL OIL PO Take by mouth daily. Reported on 07/15/2015    . lisinopril (PRINIVIL,ZESTRIL) 20 MG tablet TAKE 1 TABLET BY MOUTH  DAILY 90 tablet 1  . OMEPRAZOLE PO Take 20 mg by mouth daily. Reported on 07/15/2015    . pregabalin (LYRICA) 75 MG capsule Take 1 capsule (75 mg total) by mouth 2 (two) times daily. 180 capsule 1  .  rosuvastatin (CRESTOR) 5 MG tablet TAKE 1 TABLET BY MOUTH  DAILY FOR CHOLESTEROL 90 tablet 1  . SYRINGE-NEEDLE, DISP, 3 ML (B-D 3CC LUER-LOK SYR 22GX1") 22G X 1" 3 ML MISC Checks sugar 1 daily for fluctuating sugars for prediabetes 50 each 3  . testosterone cypionate (DEPOTESTOSTERONE CYPIONATE) 200 MG/ML injection INJECT 2 ML IN THE MUSCLE EVERY 2 WEEKS 10 mL 3  . traMADol (ULTRAM) 50 MG tablet TAKE 1-2 TABLETS BY MOUTH UP TO 4 TIMES A DAY 60 tablet 1  . Triamcinolone Acetonide (NASACORT AQ NA) Place into the nose. Reported on 07/15/2015     No current facility-administered medications on file prior to  visit.     Medical History:  Past Medical History:  Diagnosis Date  . Allergy   . Hyperlipidemia   . Hypertension   . OSA (obstructive sleep apnea)     Allergies: No Known Allergies   Review of Systems:  Review of Systems  Constitutional: Negative for chills, fever and malaise/fatigue.  HENT: Negative for congestion, ear pain and sore throat.   Eyes: Negative.   Respiratory: Negative for cough, shortness of breath and wheezing.   Cardiovascular: Negative for chest pain, palpitations and leg swelling.  Gastrointestinal: Negative for abdominal pain, blood in stool, constipation, diarrhea, heartburn and melena.  Genitourinary: Negative.   Skin: Negative.   Neurological: Negative for dizziness, sensory change, loss of consciousness and headaches.  Psychiatric/Behavioral: Negative for depression. The patient is not nervous/anxious and does not have insomnia.     Family history- Review and unchanged  Social history- Review and unchanged  Physical Exam: BP 120/78   Pulse 80   Temp 98 F (36.7 C) (Temporal)   Resp 16   Ht 5\' 9"  (1.753 m)   Wt 185 lb (83.9 kg)   BMI 27.32 kg/m  Wt Readings from Last 3 Encounters:  05/31/16 185 lb (83.9 kg)  04/20/16 186 lb (84.4 kg)  01/29/16 184 lb 12.8 oz (83.8 kg)    General Appearance: Well nourished well developed, in no apparent distress. Eyes: PERRLA, EOMs, conjunctiva no swelling or erythema ENT/Mouth: Ear canals normal without obstruction, swelling, erythma, discharge.  TMs normal bilaterally.  Oropharynx moist, clear, without exudate, or postoropharyngeal swelling. Neck: Supple, thyroid normal,no cervical adenopathy  Respiratory: Respiratory effort normal, Breath sounds clear A&P without rhonchi, wheeze, or rale.  No retractions, no accessory usage. Cardio: RRR with no MRGs. Brisk peripheral pulses without edema.  Abdomen: Soft, + BS,  Non tender, no guarding, rebound, hernias, masses. Musculoskeletal: Well healing incision to  the lumbar region with steri strips and tegaderm covering it.  Clean and dry with no erythema or discharge.  Full ROM, 5/5 strength, Normal gait Skin: Warm, dry without rashes, lesions, ecchymosis.  Neuro: Awake and oriented X 3, Cranial nerves intact. Normal muscle tone, no cerebellar symptoms. Psych: Normal affect, Insight and Judgment appropriate.    Starlyn Skeans, PA-C 10:53 AM [facility]

## 2016-06-01 LAB — TESTOSTERONE: Testosterone: 342 ng/dL (ref 250–827)

## 2016-07-29 DIAGNOSIS — H0012 Chalazion right lower eyelid: Secondary | ICD-10-CM | POA: Diagnosis not present

## 2016-08-02 ENCOUNTER — Other Ambulatory Visit: Payer: Self-pay | Admitting: Internal Medicine

## 2016-08-02 MED ORDER — PREGABALIN 75 MG PO CAPS: 75.0000 mg | ORAL_CAPSULE | Freq: Two times a day (BID) | ORAL | 1 refills | Status: DC

## 2016-08-03 ENCOUNTER — Other Ambulatory Visit: Payer: Self-pay | Admitting: *Deleted

## 2016-08-03 MED ORDER — PREGABALIN 75 MG PO CAPS: 75.0000 mg | ORAL_CAPSULE | Freq: Two times a day (BID) | ORAL | 1 refills | Status: DC

## 2016-08-08 DIAGNOSIS — H0012 Chalazion right lower eyelid: Secondary | ICD-10-CM | POA: Diagnosis not present

## 2016-08-09 DIAGNOSIS — M25511 Pain in right shoulder: Secondary | ICD-10-CM | POA: Diagnosis not present

## 2016-08-09 DIAGNOSIS — M7541 Impingement syndrome of right shoulder: Secondary | ICD-10-CM | POA: Diagnosis not present

## 2016-08-09 DIAGNOSIS — G8929 Other chronic pain: Secondary | ICD-10-CM | POA: Diagnosis not present

## 2016-08-17 ENCOUNTER — Encounter: Payer: Self-pay | Admitting: Internal Medicine

## 2016-08-17 ENCOUNTER — Ambulatory Visit (INDEPENDENT_AMBULATORY_CARE_PROVIDER_SITE_OTHER): Payer: 59 | Admitting: Internal Medicine

## 2016-08-17 VITALS — BP 106/70 | HR 76 | Temp 97.5°F | Resp 16 | Ht 69.0 in | Wt 182.4 lb

## 2016-08-17 DIAGNOSIS — E559 Vitamin D deficiency, unspecified: Secondary | ICD-10-CM

## 2016-08-17 DIAGNOSIS — E349 Endocrine disorder, unspecified: Secondary | ICD-10-CM

## 2016-08-17 DIAGNOSIS — E8881 Metabolic syndrome: Secondary | ICD-10-CM

## 2016-08-17 DIAGNOSIS — I10 Essential (primary) hypertension: Secondary | ICD-10-CM | POA: Diagnosis not present

## 2016-08-17 DIAGNOSIS — Z79899 Other long term (current) drug therapy: Secondary | ICD-10-CM | POA: Diagnosis not present

## 2016-08-17 DIAGNOSIS — E782 Mixed hyperlipidemia: Secondary | ICD-10-CM | POA: Diagnosis not present

## 2016-08-17 LAB — CBC WITH DIFFERENTIAL/PLATELET
Basophils Absolute: 77 cells/uL (ref 0–200)
Basophils Relative: 1 %
EOS PCT: 5 %
Eosinophils Absolute: 385 cells/uL (ref 15–500)
HCT: 49.7 % (ref 38.5–50.0)
HEMOGLOBIN: 16.3 g/dL (ref 13.2–17.1)
Lymphocytes Relative: 24 %
Lymphs Abs: 1848 cells/uL (ref 850–3900)
MCH: 27.6 pg (ref 27.0–33.0)
MCHC: 32.8 g/dL (ref 32.0–36.0)
MCV: 84.2 fL (ref 80.0–100.0)
MONOS PCT: 10 %
MPV: 9.4 fL (ref 7.5–12.5)
Monocytes Absolute: 770 cells/uL (ref 200–950)
NEUTROS ABS: 4620 {cells}/uL (ref 1500–7800)
Neutrophils Relative %: 60 %
PLATELETS: 272 10*3/uL (ref 140–400)
RBC: 5.9 MIL/uL — AB (ref 4.20–5.80)
RDW: 15.5 % — ABNORMAL HIGH (ref 11.0–15.0)
WBC: 7.7 10*3/uL (ref 3.8–10.8)

## 2016-08-17 LAB — TSH: TSH: 2.79 m[IU]/L (ref 0.40–4.50)

## 2016-08-17 NOTE — Patient Instructions (Signed)

## 2016-08-17 NOTE — Progress Notes (Signed)
This very nice 56 y.o. MWM presents for 6 month follow up with Hypertension, Hyperlipidemia, Pre-Diabetes and Vitamin D Deficiency.  Other problems include hx/o OSA  on CPAP with  improved sleep hygiene and no significant daytime excessive sleepiness.     Patient is treated for HTN (2007) & BP has been controlled at home. Today's BP is at goal - 106/70. Patient has had no complaints of any cardiac type chest pain, palpitations, dyspnea/orthopnea/PND, dizziness, claudication, or dependent edema.     Hyperlipidemia is controlled with diet & meds. Patient denies myalgias or other med SE's. Last Lipids were at goal with sl elevated Trig's:  Lab Results  Component Value Date   CHOL 164 05/31/2016   HDL 44 05/31/2016   LDLCALC 88 05/31/2016   TRIG 159 (H) 05/31/2016   CHOLHDL 3.7 05/31/2016      Also, the patient has history of PreDiabetes/Insulin Resistance since 2015 (A1c_5.4%/Insulin elevated 45)  and he has had no symptoms of reactive hypoglycemia, diabetic polys, paresthesias or visual blurring.  Last A1c 5.1% with elevated insulin 37 in Oct 2017.      Patient has hx/o low testosterone 292 between treatment in July 2017, but premorbid low level done ant previous practitioner's office when he was initially started on Testim are not available. Since on parenteral Testosterone replacement, he reports improved stamina and sence of well-being. Further, the patient also has history of Vitamin D Deficiency and supplements vitamin D without any suspected side-effects. Last vitamin D was at goal.   Lab Results  Component Value Date   VD25OH 93 01/29/2016   Current Outpatient Prescriptions on File Prior to Visit  Medication Sig  . butalbital-acetaminophen-caffeine (FIORICET, ESGIC) 50-325-40 MG tablet TAKE 1 TO 2 TABLETS UP TO 4 TIMES A DAY  . KRILL OIL PO Take by mouth daily. Reported on 07/15/2015  . lisinopril (PRINIVIL,ZESTRIL) 20 MG tablet TAKE 1 TABLET BY MOUTH  DAILY  . OMEPRAZOLE PO Take  20 mg by mouth daily. Reported on 07/15/2015  . pregabalin (LYRICA) 75 MG capsule Take 1 capsule (75 mg total) by mouth 2 (two) times daily.  . rosuvastatin (CRESTOR) 5 MG tablet TAKE 1 TABLET BY MOUTH  DAILY FOR CHOLESTEROL  . SYRINGE-NEEDLE, DISP, 3 ML (B-D 3CC LUER-LOK SYR 22GX1") 22G X 1" 3 ML MISC Checks sugar 1 daily for fluctuating sugars for prediabetes  . testosterone cypionate (DEPOTESTOSTERONE CYPIONATE) 200 MG/ML injection INJECT 2 ML IN THE MUSCLE EVERY 2 WEEKS  . traMADol (ULTRAM) 50 MG tablet TAKE 1-2 TABLETS BY MOUTH UP TO 4 TIMES A DAY  . Triamcinolone Acetonide (NASACORT AQ NA) Place into the nose. Reported on 07/15/2015   No current facility-administered medications on file prior to visit.    No Known Allergies   PMHx:   Past Medical History:  Diagnosis Date  . Allergy   . Hyperlipidemia   . Hypertension   . OSA (obstructive sleep apnea)    Immunization History  Administered Date(s) Administered  . Influenza,inj,quad, With Preservative 01/29/2016  . PPD Test 10/25/2013, 12/22/2014, 01/29/2016  . Pneumococcal Polysaccharide-23 12/22/2014  . Tdap 10/25/2013   Past Surgical History:  Procedure Laterality Date  . APPENDECTOMY  1983  . HAND SURGERY Right 2007   FHx:    Reviewed / unchanged  SHx:    Reviewed / unchanged  Systems Review:  Constitutional: Denies fever, chills, wt changes, headaches, insomnia, fatigue, night sweats, change in appetite. Eyes: Denies redness, blurred vision, diplopia, discharge, itchy, watery  eyes.  ENT: Denies discharge, congestion, post nasal drip, epistaxis, sore throat, earache, hearing loss, dental pain, tinnitus, vertigo, sinus pain, snoring.  CV: Denies chest pain, palpitations, irregular heartbeat, syncope, dyspnea, diaphoresis, orthopnea, PND, claudication or edema. Respiratory: denies cough, dyspnea, DOE, pleurisy, hoarseness, laryngitis, wheezing.  Gastrointestinal: Denies dysphagia, odynophagia, heartburn, reflux, water  brash, abdominal pain or cramps, nausea, vomiting, bloating, diarrhea, constipation, hematemesis, melena, hematochezia  or hemorrhoids. Genitourinary: Denies dysuria, frequency, urgency, nocturia, hesitancy, discharge, hematuria or flank pain. Musculoskeletal: Denies arthralgias, myalgias, stiffness, jt. swelling, pain, limping or strain/sprain.  Skin: Denies pruritus, rash, hives, warts, acne, eczema or change in skin lesion(s). Neuro: No weakness, tremor, incoordination, spasms, paresthesia or pain. Psychiatric: Denies confusion, memory loss or sensory loss. Endo: Denies change in weight, skin or hair change.  Heme/Lymph: No excessive bleeding, bruising or enlarged lymph nodes.  Physical Exam  BP 106/70   Pulse 76   Temp 97.5 F (36.4 C)   Resp 16   Ht 5\' 9"  (1.753 m)   Wt 182 lb 6.4 oz (82.7 kg)   BMI 26.94 kg/m   Appears well nourished, well groomed  and in no distress.  Eyes: PERRLA, EOMs, conjunctiva no swelling or erythema. Sinuses: No frontal/maxillary tenderness ENT/Mouth: EAC's clear, TM's nl w/o erythema, bulging. Nares clear w/o erythema, swelling, exudates. Oropharynx clear without erythema or exudates. Oral hygiene is good. Tongue normal, non obstructing. Hearing intact.  Neck: Supple. Thyroid nl. Car 2+/2+ without bruits, nodes or JVD. Chest: Respirations nl with BS clear & equal w/o rales, rhonchi, wheezing or stridor.  Cor: Heart sounds normal w/ regular rate and rhythm without sig. murmurs, gallops, clicks or rubs. Peripheral pulses normal and equal  without edema.  Abdomen: Soft & bowel sounds normal. Non-tender w/o guarding, rebound, hernias, masses or organomegaly.  Lymphatics: Unremarkable.  Musculoskeletal: Full ROM all peripheral extremities, joint stability, 5/5 strength and normal gait.  Skin: Warm, dry without exposed rashes, lesions or ecchymosis apparent.  Neuro: Cranial nerves intact, reflexes equal bilaterally. Sensory-motor testing grossly intact.  Tendon reflexes grossly intact.  Pysch: Alert & oriented x 3.  Insight and judgement nl & appropriate. No ideations.  Assessment and Plan:  1. Essential hypertension  - Continue medication, monitor blood pressure at home.  - Continue DASH diet. Reminder to go to the ER if any CP,  SOB, nausea, dizziness, severe HA, changes vision/speech,  left arm numbness and tingling and jaw pain.  - BASIC METABOLIC PANEL WITH GFR - Magnesium - TSH  2. Hyperlipidemia, mixed  - Continue diet/meds, exercise,& lifestyle modifications.  - Continue monitor periodic cholesterol/liver & renal functions   - Hepatic function panel - Lipid panel - TSH  3. Insulin resistance / Prediabetes  - continue diet, exercise, lifestyle modifications.  - Monitor appropriate labs.  - Hemoglobin A1c - Insulin, random  4. Vitamin D deficiency  - Continue supplementation.  - VITAMIN D 25 Hydroxy   5. Testosterone deficiency  - Testosterone  6. Medication management  - CBC with Differential/Platelet - BASIC METABOLIC PANEL WITH GFR - Hepatic function panel - Magnesium - Lipid panel - TSH - Hemoglobin A1c - Insulin, random - VITAMIN D 25 Hydroxy  - Testosterone       Discussed  regular exercise, BP monitoring, weight control to achieve/maintain BMI less than 25 and discussed med and SE's. Recommended labs to assess and monitor clinical status with further disposition pending results of labs. Over 30 minutes of exam, counseling, chart review was performed.

## 2016-08-18 LAB — HEPATIC FUNCTION PANEL
ALT: 34 U/L (ref 9–46)
AST: 24 U/L (ref 10–35)
Albumin: 4.1 g/dL (ref 3.6–5.1)
Alkaline Phosphatase: 40 U/L (ref 40–115)
BILIRUBIN DIRECT: 0.2 mg/dL (ref <=0.2)
BILIRUBIN TOTAL: 0.7 mg/dL (ref 0.2–1.2)
Indirect Bilirubin: 0.5 mg/dL (ref 0.2–1.2)
Total Protein: 6.6 g/dL (ref 6.1–8.1)

## 2016-08-18 LAB — INSULIN, RANDOM: Insulin: 4.6 u[IU]/mL (ref 2.0–19.6)

## 2016-08-18 LAB — LIPID PANEL
CHOL/HDL RATIO: 3.6 ratio (ref <5.0)
Cholesterol: 115 mg/dL (ref <200)
HDL: 32 mg/dL — AB (ref >40)
LDL Cholesterol: 57 mg/dL (ref <100)
Triglycerides: 132 mg/dL (ref <150)
VLDL: 26 mg/dL (ref <30)

## 2016-08-18 LAB — BASIC METABOLIC PANEL WITH GFR
BUN: 11 mg/dL (ref 7–25)
CALCIUM: 9.4 mg/dL (ref 8.6–10.3)
CHLORIDE: 103 mmol/L (ref 98–110)
CO2: 29 mmol/L (ref 20–31)
CREATININE: 1.03 mg/dL (ref 0.70–1.33)
GFR, EST NON AFRICAN AMERICAN: 81 mL/min (ref >=60)
GFR, Est African American: >89 mL/min (ref >=60)
Glucose, Bld: 72 mg/dL (ref 65–99)
Potassium: 4.3 mmol/L (ref 3.5–5.3)
SODIUM: 141 mmol/L (ref 135–146)

## 2016-08-18 LAB — MAGNESIUM: MAGNESIUM: 2 mg/dL (ref 1.5–2.5)

## 2016-08-18 LAB — TESTOSTERONE: TESTOSTERONE: 1393 ng/dL — AB (ref 250–827)

## 2016-08-18 LAB — HEMOGLOBIN A1C
Hgb A1c MFr Bld: 5.1 % (ref <5.7)
Mean Plasma Glucose: 100 mg/dL

## 2016-08-18 LAB — VITAMIN D 25 HYDROXY (VIT D DEFICIENCY, FRACTURES): VIT D 25 HYDROXY: 91 ng/mL (ref 30–100)

## 2016-08-19 DIAGNOSIS — G8929 Other chronic pain: Secondary | ICD-10-CM | POA: Diagnosis not present

## 2016-08-19 DIAGNOSIS — M25511 Pain in right shoulder: Secondary | ICD-10-CM | POA: Diagnosis not present

## 2016-09-23 ENCOUNTER — Other Ambulatory Visit: Payer: Self-pay | Admitting: Internal Medicine

## 2016-10-21 ENCOUNTER — Other Ambulatory Visit: Payer: Self-pay | Admitting: Internal Medicine

## 2016-11-23 NOTE — Progress Notes (Signed)
Assessment and Plan:  Hypertension -Continue medication, monitor blood pressure at home. Continue DASH diet.  Reminder to go to the ER if any CP, SOB, nausea, dizziness, severe HA, changes vision/speech, left arm numbness and tingling and jaw pain.  Cholesterol -Continue diet and exercise. Check cholesterol.    Prediabetes  -Continue diet and exercise. Check A1C  Vitamin D Def - check level and continue medications.   Hypogonadism - continue replacement therapy, check testosterone levels as needed.   Continue diet and meds as discussed. Further disposition pending results of labs. Over 30 minutes of exam, counseling, chart review, and critical decision making was performed Future Appointments Date Time Provider [place]  03/07/2017 10:00 AM [name], MD [facility] None    HPI 56 y.o. male  presents for 3 month follow up on hypertension, cholesterol, prediabetes, and vitamin D deficiency.   His blood pressure has been controlled at home, today their BP is BP: 128/80  He does workout, but has not worked out.  He denies chest pain, shortness of breath, dizziness.  He is on cholesterol medication and denies myalgias. His cholesterol is at goal. The cholesterol last visit was:   Lab Results  Component Value Date   CHOL 115 08/17/2016   HDL 32 (L) 08/17/2016   LDLCALC 57 08/17/2016   TRIG 132 08/17/2016   CHOLHDL 3.6 08/17/2016  Last A1C in the office was:  Lab Results  Component Value Date   HGBA1C 5.1 08/17/2016  Patient is on Vitamin D supplement.   Lab Results  Component Value Date   VD25OH 3 08/17/2016  He has a history of testosterone deficiency and is on testosterone replacement, 2 cc q 2 weeks, Due satruday.Marland Kitchen He states that the testosterone helps with his energy, libido, muscle mass. Lab Results  Component Value Date   TESTOSTERONE 1,393 (H) 08/17/2016  BMI is Body mass index is 26.82 kg/m., he is working on diet and exercise. Wt Readings from Last  3 Encounters:  11/24/16 181 lb 9.6 oz (82.4 kg)  08/17/16 182 lb 6.4 oz (82.7 kg)  05/31/16 185 lb (83.9 kg)    Current Medications:  Current Outpatient Prescriptions on File Prior to Visit  Medication Sig Dispense Refill  . butalbital-acetaminophen-caffeine (FIORICET, ESGIC) 50-325-40 MG tablet TAKE 1 TO 2 TABLETS UP TO 4 TIMES A DAY 50 tablet 1  . fexofenadine (ALLEGRA) 180 MG tablet Take 180 mg by mouth daily.    Marland Kitchen KRILL OIL PO Take by mouth daily. Reported on 07/15/2015    . lisinopril (PRINIVIL,ZESTRIL) 20 MG tablet TAKE 1 TABLET BY MOUTH  DAILY 90 tablet 1  . OMEPRAZOLE PO Take 20 mg by mouth daily. Reported on 07/15/2015    . pregabalin (LYRICA) 75 MG capsule Take 1 capsule (75 mg total) by mouth 2 (two) times daily. 180 capsule 1  . rosuvastatin (CRESTOR) 5 MG tablet TAKE 1 TABLET BY MOUTH  DAILY FOR CHOLESTEROL 90 tablet 1  . SYRINGE-NEEDLE, DISP, 3 ML (B-D 3CC LUER-LOK SYR 22GX1") 22G X 1" 3 ML MISC Checks sugar 1 daily for fluctuating sugars for prediabetes 50 each 3  . testosterone cypionate (DEPOTESTOSTERONE CYPIONATE) 200 MG/ML injection INJECT 2 ML IN THE MUSCLE EVERY 2 WEEKS 10 mL 0  . traMADol (ULTRAM) 50 MG tablet TAKE 1-2 TABLETS BY MOUTH UP TO 4 TIMES A DAY 60 tablet 1  . Triamcinolone Acetonide (NASACORT AQ NA) Place into the nose. Reported on 07/15/2015     No current facility-administered medications on file  prior to visit.    Medical History:  Past Medical History:  Diagnosis Date  . Allergy   . Hyperlipidemia   . Hypertension   . OSA (obstructive sleep apnea)    Allergies: Not on File   Review of Systems:  Review of Systems  Constitutional: Negative.   HENT: Negative.   Eyes: Negative.   Respiratory: Negative.   Cardiovascular: Negative.   Gastrointestinal: Negative.   Genitourinary: Negative.   Musculoskeletal: Negative for back pain, falls, joint pain, myalgias and neck pain.  Skin: Negative.   Neurological: Negative.   Endo/Heme/Allergies: Negative.    Psychiatric/Behavioral: Negative.     Family history- Review and unchanged Social history- Review and unchanged Physical Exam: BP 128/80   Pulse 85   Temp (!) 97.5 F (36.4 C)   Ht 5\' 9"  (1.753 m)   Wt 181 lb 9.6 oz (82.4 kg)   SpO2 96%   BMI 26.82 kg/m  Wt Readings from Last 3 Encounters:  11/24/16 181 lb 9.6 oz (82.4 kg)  08/17/16 182 lb 6.4 oz (82.7 kg)  05/31/16 185 lb (83.9 kg)   General Appearance: Well nourished, in no apparent distress. Eyes: PERRLA, EOMs, conjunctiva no swelling or erythema Sinuses: No Frontal/maxillary tenderness ENT/Mouth: Ext aud canals clear, TMs without erythema, bulging. No erythema, swelling, or exudate on post pharynx.  Tonsils not swollen or erythematous. Hearing normal.  Neck: Supple, thyroid normal.  Respiratory: Respiratory effort normal, BS equal bilaterally without rales, rhonchi, wheezing or stridor.  Cardio: RRR with no MRGs. Brisk peripheral pulses without edema.  Abdomen: Soft, + BS,  Non tender, no guarding, rebound, hernias, masses. Lymphatics: Non tender without lymphadenopathy.  Musculoskeletal: Full ROM, 5/5 strength,  Skin: Warm, dry without rashes, lesions, ecchymosis.  Neuro: Cranial nerves intact. Normal muscle tone, no cerebellar symptoms. Psych: Awake and oriented X 3, normal affect, Insight and Judgment appropriate.    Vicie Mutters, PA-C 12:39 PM [facility]

## 2016-11-24 ENCOUNTER — Encounter: Payer: Self-pay | Admitting: Physician Assistant

## 2016-11-24 ENCOUNTER — Ambulatory Visit (INDEPENDENT_AMBULATORY_CARE_PROVIDER_SITE_OTHER): Payer: 59 | Admitting: Physician Assistant

## 2016-11-24 VITALS — BP 128/80 | HR 85 | Temp 97.5°F | Ht 69.0 in | Wt 181.6 lb

## 2016-11-24 DIAGNOSIS — I10 Essential (primary) hypertension: Secondary | ICD-10-CM

## 2016-11-24 DIAGNOSIS — E782 Mixed hyperlipidemia: Secondary | ICD-10-CM

## 2016-11-24 DIAGNOSIS — Z79899 Other long term (current) drug therapy: Secondary | ICD-10-CM | POA: Diagnosis not present

## 2016-11-24 DIAGNOSIS — E349 Endocrine disorder, unspecified: Secondary | ICD-10-CM | POA: Diagnosis not present

## 2016-11-24 NOTE — Patient Instructions (Signed)
What Causes Tonsil Stones? Your tonsils are filled with nooks and crannies where bacteria and other materials, including dead cells and mucous, can become trapped. When this happens, the debris can become concentrated in white formations that occur in the pockets. Tonsil stones, or tonsilloliths, are formed when this trapped debris hardens, or calcifies. This tends to happen most often in people who have chronic inflammation in their tonsils or repeated bouts of tonsillitis. While many people have small tonsilloliths that develop in their tonsils, it is quite rare to have a large and solidified tonsil stone.  How Are Tonsil Stones Treated? The appropriate treatment for a tonsil stone depends on the size of the tonsillolith and its potential to cause discomfort or harm. Options include: No treatment. Many tonsil stones, especially ones that have no symptoms, require no special treatment. At-home removal. Some people choose to dislodge tonsil stones at home with the use of picks or swabs. Salt water gargles. Gargling with warm, salty water may help ease the discomfort of tonsillitis, which often accompanies tonsil stones. Allergy pills- Taking allergy pill every day can decrease the inflammation and decrease the formation of tonsil stones Continue reading below...  Antibiotics. Various antibiotics can be used to treat tonsil stones. While they may be helpful for some people, they cannot correct the basic problem that is causing tonsilloliths. Also, antibiotics can have side effects. Surgical removal. When tonsil stones are exceedingly large and symptomatic, it may be necessary for a surgeon to remove them. In certain instances, a doctor will be able to perform this relatively simple procedure using a local numbing agent. Then the patient will not need general anesthesia.  Can Tonsil Stones Be Prevented? Take an antihistamine daily can prevent inflammation and formation.  Since tonsil stones are more  common in people who have chronic tonsillitis, the only surefire way to prevent them is with surgical removal of the tonsils. This procedure, known as a tonsillectomy, removes the tissues of the tonsils entirely, thereby eliminating the possibility of tonsillolith formation, however, adults are more likely to have complications from the surgery than children so it is considered a last resort.   9 Ways to Naturally Increase Testosterone Levels  1.   Lose Weight If you're overweight, shedding the excess pounds may increase your testosterone levels, according to research presented at the Endocrine Society's 2012 meeting. Overweight men are more likely to have low testosterone levels to begin with, so this is an important trick to increase your body's testosterone production when you need it most.  2.   High-Intensity Exercise like Peak Fitness  Short intense exercise has a proven positive effect on increasing testosterone levels and preventing its decline. That's unlike aerobics or prolonged moderate exercise, which have shown to have negative or no effect on testosterone levels. Having a whey protein meal after exercise can further enhance the satiety/testosterone-boosting impact (hunger hormones cause the opposite effect on your testosterone and libido). Here's a summary of what a typical high-intensity Peak Fitness routine might look like: " Warm up for three minutes  " Exercise as hard and fast as you can for 30 seconds. You should feel like you couldn't possibly go on another few seconds  " Recover at a slow to moderate pace for 90 seconds  " Repeat the high intensity exercise and recovery 7 more times .  3.   Consume Plenty of Zinc The mineral zinc is important for testosterone production, and supplementing your diet for as little as six weeks has been  shown to cause a marked improvement in testosterone among men with low levels.1 Likewise, research has shown that restricting dietary sources of  zinc leads to a significant decrease in testosterone, while zinc supplementation increases it2 -- and even protects men from exercised-induced reductions in testosterone levels.3 It's estimated that up to 14 percent of adults over the age of 60 may have lower than recommended zinc intakes; even when dietary supplements were added in, an estimated 20-25 percent of older adults still had inadequate zinc intakes, according to a [employer] and Nutrition Examination Survey.4 Your diet is the best source of zinc; along with protein-rich foods like meats and fish, other good dietary sources of zinc include raw milk, raw cheese, beans, and yogurt or kefir made from raw milk. It can be difficult to obtain enough dietary zinc if you're a vegetarian, and also for meat-eaters as well, largely because of conventional farming methods that rely heavily on chemical fertilizers and pesticides. These chemicals deplete the soil of nutrients ... nutrients like zinc that must be absorbed by plants in order to be passed on to you. In many cases, you may further deplete the nutrients in your food by the way you prepare it. For most food, cooking it will drastically reduce its levels of nutrients like zinc . particularly over-cooking, which many people do. If you decide to use a zinc supplement, stick to a dosage of less than 40 mg a day, as this is the recommended adult upper limit. Taking too much zinc can interfere with your body's ability to absorb other minerals, especially copper, and may cause nausea as a side effect.  4.   Strength Training In addition to [employer]itness, strength training is also known to boost testosterone levels, provided you are doing so intensely enough. When strength training to boost testosterone, you'll want to increase the weight and lower your number of reps, and then focus on exercises that work a large number of muscles, such as dead lifts or squats.  You can "turbo-charge" your weight  training by going slower. By slowing down your movement, you're actually turning it into a high-intensity exercise. Super Slow movement allows your muscle, at the microscopic level, to access the maximum number of cross-bridges between the protein filaments that produce movement in the muscle.   5.   Optimize Your Vitamin D Levels Vitamin D, a steroid hormone, is essential for the healthy development of the nucleus of the sperm cell, and helps maintain semen quality and sperm count. Vitamin D also increases levels of testosterone, which may boost libido. In one study, overweight men who were given vitamin D supplements had a significant increase in testosterone levels after one year.5   6.   Reduce Stress When you're under a lot of stress, your body releases high levels of the stress hormone cortisol. This hormone actually blocks the effects of testosterone,6 presumably because, from a biological standpoint, testosterone-associated behaviors (mating, competing, aggression) may have lowered your chances of survival in an emergency (hence, the "fight or flight" response is dominant, courtesy of cortisol).  7.   Limit or Eliminate Sugar from Your Diet Testosterone levels decrease after you eat sugar, which is likely because the sugar leads to a high insulin level, another factor leading to low testosterone.7 Based on USDA estimates, the average American consumes 12 teaspoons of sugar a day, which equates to about TWO TONS of sugar during a lifetime.  8.   Eat Healthy Fats By healthy, this means not only  mon- and polyunsaturated fats, like that found in avocadoes and nuts, but also saturated, as these are essential for building testosterone. Research shows that a diet with less than 40 percent of energy as fat (and that mainly from animal sources, i.e. saturated) lead to a decrease in testosterone levels.8 My personal diet is about 60-70 percent healthy fat, and other experts agree that the ideal diet  includes somewhere between 50-70 percent fat.  It's important to understand that your body requires saturated fats from animal and vegetable sources (such as meat, dairy, certain oils, and tropical plants like coconut) for optimal functioning, and if you neglect this important food group in favor of sugar, grains and other starchy carbs, your health and weight are almost guaranteed to suffer. Examples of healthy fats you can eat more of to give your testosterone levels a boost include: Olives and Olive oil  Coconuts and coconut oil Butter made from raw grass-fed organic milk Raw nuts, such as, almonds or pecans Organic pastured egg yolks Avocados Grass-fed meats Palm oil Unheated organic nut oils   9.   Boost Your Intake of Branch Chain Amino Acids (BCAA) from Foods Like [place] suggests that BCAAs result in higher testosterone levels, particularly when taken along with resistance training.9 While BCAAs are available in supplement form, you'll find the highest concentrations of BCAAs like leucine in dairy products - especially quality cheeses and whey protein. Even when getting leucine from your natural food supply, it's often wasted or used as a building block instead of an anabolic agent. So to create the correct anabolic environment, you need to boost leucine consumption way beyond mere maintenance levels. That said, keep in mind that using leucine as a free form amino acid can be highly counterproductive as when free form amino acids are artificially administrated, they rapidly enter your circulation while disrupting insulin function, and impairing your body's glycemic control. Food-based leucine is really the ideal form that can benefit your muscles without side effects.

## 2016-11-25 LAB — CBC WITH DIFFERENTIAL/PLATELET
BASOS PCT: 1.6 %
Basophils Absolute: 102 cells/uL (ref 0–200)
EOS ABS: 570 {cells}/uL — AB (ref 15–500)
Eosinophils Relative: 8.9 %
HCT: 50.1 % — ABNORMAL HIGH (ref 38.5–50.0)
Hemoglobin: 16.3 g/dL (ref 13.2–17.1)
LYMPHS ABS: 1395 {cells}/uL (ref 850–3900)
MCH: 26.3 pg — AB (ref 27.0–33.0)
MCHC: 32.5 g/dL (ref 32.0–36.0)
MCV: 80.9 fL (ref 80.0–100.0)
MPV: 9.8 fL (ref 7.5–12.5)
Monocytes Relative: 7.9 %
NEUTROS ABS: 3827 {cells}/uL (ref 1500–7800)
Neutrophils Relative %: 59.8 %
Platelets: 303 10*3/uL (ref 140–400)
RBC: 6.19 10*6/uL — AB (ref 4.20–5.80)
RDW: 12.5 % (ref 11.0–15.0)
Total Lymphocyte: 21.8 %
WBC: 6.4 10*3/uL (ref 3.8–10.8)
WBCMIX: 506 {cells}/uL (ref 200–950)

## 2016-11-25 LAB — BASIC METABOLIC PANEL WITH GFR
BUN: 17 mg/dL (ref 7–25)
CHLORIDE: 103 mmol/L (ref 98–110)
CO2: 28 mmol/L (ref 20–32)
Calcium: 9.4 mg/dL (ref 8.6–10.3)
Creat: 1.07 mg/dL (ref 0.70–1.33)
GFR, EST NON AFRICAN AMERICAN: 78 mL/min/{1.73_m2} (ref >=60)
GFR, Est African American: 90 mL/min/{1.73_m2} (ref >=60)
Glucose, Bld: 88 mg/dL (ref 65–99)
Potassium: 4.1 mmol/L (ref 3.5–5.3)
Sodium: 140 mmol/L (ref 135–146)

## 2016-11-25 LAB — LIPID PANEL
CHOLESTEROL: 135 mg/dL (ref <200)
HDL: 35 mg/dL — ABNORMAL LOW (ref >40)
LDL CHOLESTEROL (CALC): 74 mg/dL
Non-HDL Cholesterol (Calc): 100 mg/dL (calc) (ref <130)
Total CHOL/HDL Ratio: 3.9 (calc) (ref <5.0)
Triglycerides: 155 mg/dL — ABNORMAL HIGH (ref <150)

## 2016-11-25 LAB — HEPATIC FUNCTION PANEL
AG Ratio: 1.8 (calc) (ref 1.0–2.5)
ALBUMIN MSPROF: 4.2 g/dL (ref 3.6–5.1)
ALT: 46 U/L (ref 9–46)
AST: 31 U/L (ref 10–35)
Alkaline phosphatase (APISO): 53 U/L (ref 40–115)
Bilirubin, Direct: 0.1 mg/dL (ref 0.0–0.2)
GLOBULIN: 2.3 g/dL (ref 1.9–3.7)
Indirect Bilirubin: 0.5 mg/dL (calc) (ref 0.2–1.2)
TOTAL PROTEIN: 6.5 g/dL (ref 6.1–8.1)
Total Bilirubin: 0.6 mg/dL (ref 0.2–1.2)

## 2016-11-25 LAB — TSH: TSH: 3.71 m[IU]/L (ref 0.40–4.50)

## 2016-11-25 LAB — TESTOSTERONE: Testosterone: 339 ng/dL (ref 250–827)

## 2016-11-25 LAB — MAGNESIUM: Magnesium: 1.9 mg/dL (ref 1.5–2.5)

## 2016-12-19 ENCOUNTER — Other Ambulatory Visit: Payer: Self-pay | Admitting: Internal Medicine

## 2017-01-18 ENCOUNTER — Other Ambulatory Visit: Payer: Self-pay | Admitting: Internal Medicine

## 2017-01-18 NOTE — Telephone Encounter (Signed)
Please call Tramadol

## 2017-01-30 DIAGNOSIS — Z23 Encounter for immunization: Secondary | ICD-10-CM | POA: Diagnosis not present

## 2017-02-14 DIAGNOSIS — H0012 Chalazion right lower eyelid: Secondary | ICD-10-CM | POA: Diagnosis not present

## 2017-03-07 ENCOUNTER — Encounter: Payer: Self-pay | Admitting: Internal Medicine

## 2017-03-07 ENCOUNTER — Ambulatory Visit: Payer: 59 | Admitting: Internal Medicine

## 2017-03-07 VITALS — BP 134/82 | HR 84 | Temp 97.7°F | Resp 18 | Ht 69.0 in | Wt 183.4 lb

## 2017-03-07 DIAGNOSIS — Z125 Encounter for screening for malignant neoplasm of prostate: Secondary | ICD-10-CM

## 2017-03-07 DIAGNOSIS — Z1212 Encounter for screening for malignant neoplasm of rectum: Secondary | ICD-10-CM

## 2017-03-07 DIAGNOSIS — I10 Essential (primary) hypertension: Secondary | ICD-10-CM

## 2017-03-07 DIAGNOSIS — Z0000 Encounter for general adult medical examination without abnormal findings: Secondary | ICD-10-CM

## 2017-03-07 DIAGNOSIS — R972 Elevated prostate specific antigen [PSA]: Secondary | ICD-10-CM

## 2017-03-07 DIAGNOSIS — N401 Enlarged prostate with lower urinary tract symptoms: Secondary | ICD-10-CM

## 2017-03-07 DIAGNOSIS — E8881 Metabolic syndrome: Secondary | ICD-10-CM

## 2017-03-07 DIAGNOSIS — G43009 Migraine without aura, not intractable, without status migrainosus: Secondary | ICD-10-CM

## 2017-03-07 DIAGNOSIS — Z136 Encounter for screening for cardiovascular disorders: Secondary | ICD-10-CM | POA: Diagnosis not present

## 2017-03-07 DIAGNOSIS — E782 Mixed hyperlipidemia: Secondary | ICD-10-CM

## 2017-03-07 DIAGNOSIS — E559 Vitamin D deficiency, unspecified: Secondary | ICD-10-CM

## 2017-03-07 DIAGNOSIS — E349 Endocrine disorder, unspecified: Secondary | ICD-10-CM

## 2017-03-07 DIAGNOSIS — Z111 Encounter for screening for respiratory tuberculosis: Secondary | ICD-10-CM

## 2017-03-07 DIAGNOSIS — G4733 Obstructive sleep apnea (adult) (pediatric): Secondary | ICD-10-CM

## 2017-03-07 DIAGNOSIS — Z0001 Encounter for general adult medical examination with abnormal findings: Secondary | ICD-10-CM

## 2017-03-07 DIAGNOSIS — Z79899 Other long term (current) drug therapy: Secondary | ICD-10-CM

## 2017-03-07 DIAGNOSIS — R5383 Other fatigue: Secondary | ICD-10-CM

## 2017-03-07 DIAGNOSIS — Z1211 Encounter for screening for malignant neoplasm of colon: Secondary | ICD-10-CM

## 2017-03-07 NOTE — Patient Instructions (Signed)

## 2017-03-07 NOTE — Progress Notes (Signed)
[facility]   [name], M.D.     Uvaldo Bristle. Silverio Lay, P.A.-C Liane Comber, [place]                [address] [place], [place] [address] Telephone [phone] Telefax [phone] Annual  Screening/Preventative Visit  & Comprehensive Evaluation & Examination     This very nice 56 y.o. MWM presents for a Screening/Preventative Visit & comprehensive evaluation and management of multiple medical co-morbidities.  Patient has been followed for HTN, Prediabetes, Hyperlipidemia and Vitamin D Deficiency. Patient's GERD is controlled with PPI & diet.Patient's OSA n CPAP continues with improved sleep hygiene and restorative sleep and less daytime excessive sleepiness. Patient had Colonoscopy in Dec 2013 [facility]) recommending  f/u 5 years.      HTN predates since 2007. Patient's BP has been controlled at home.  Today's BP is at goal - 134/82. Patient denies any cardiac symptoms as chest pain, palpitations, shortness of breath, dizziness or ankle swelling.     Patient's hyperlipidemia is controlled with diet and medications. Patient denies myalgias or other medication SE's. Last lipids were at goal: Lab Results  Component Value Date   CHOL 135 11/24/2016   HDL 35 (L) 11/24/2016   LDLCALC 57 08/17/2016   TRIG 155 (H) 11/24/2016   CHOLHDL 3.9 11/24/2016      Patient has prediabetes/Insulin Resistance  (A1c 5.4% / Insulin 45 / 2015) and patient denies reactive hypoglycemic symptoms, visual blurring, diabetic polys or paresthesias. Last A1c was normal & at goal: Lab Results  Component Value Date   HGBA1C 5.1 08/17/2016       Patient has hx/o Low Testosterone and was initiated on replacement at a prior Physician's practice, and has been on Depo-Testosterone with improved stamina & sense of well-being. Finally, patient has history of Vitamin D Deficiency and last vitamin D was at goal: Lab Results   Component Value Date   VD25OH 91 08/17/2016   Current Outpatient Medications on File Prior to Visit  Medication Sig  . butalbital-acetaminophen-caffeine (FIORICET, ESGIC) 50-325-40 MG tablet TAKE 1 TO 2 TABLETS UP TO 4 TIMES A DAY  . KRILL OIL PO Take by mouth daily. Reported on 07/15/2015  . lisinopril (PRINIVIL,ZESTRIL) 20 MG tablet TAKE 1 TABLET BY MOUTH  DAILY  . OMEPRAZOLE PO Take 20 mg by mouth daily. Reported on 07/15/2015  . OVER THE COUNTER MEDICATION Zyrtec OTC 1 tablet daily PRN.  . pregabalin (LYRICA) 75 MG capsule Take 1 capsule (75 mg total) by mouth 2 (two) times daily.  . rosuvastatin (CRESTOR) 5 MG tablet TAKE 1 TABLET BY MOUTH  DAILY FOR CHOLESTEROL  . SYRINGE-NEEDLE, DISP, 3 ML (B-D 3CC LUER-LOK SYR 22GX1") 22G X 1" 3 ML MISC Checks sugar 1 daily for fluctuating sugars for prediabetes  . testosterone cypionate (DEPOTESTOSTERONE CYPIONATE) 200 MG/ML injection INJECT 2 MLS IN THE MUSCLE EVERY 2 WEEKS  . traMADol (ULTRAM) 50 MG tablet Take 1 tablet every 4 hours if needed for severe pain  . Triamcinolone Acetonide (NASACORT AQ NA) Place into the nose. Reported on 07/15/2015   No current facility-administered medications on file prior to visit.    Not on File Past Medical History:  Diagnosis Date  . Allergy   . Hyperlipidemia   . Hypertension   . OSA (obstructive sleep apnea)    Health Maintenance  Topic Date Due  .  COLONOSCOPY  03/11/2022  . TETANUS/TDAP  10/26/2023  . INFLUENZA VACCINE  Completed  . Hepatitis C Screening  Completed  . HIV Screening  Completed   Immunization History  Administered Date(s) Administered  . Influenza,inj,quad, With Preservative 01/29/2016  . Influenza-Unspecified 01/30/2017  . PPD Test 10/25/2013, 12/22/2014, 01/29/2016  . Pneumococcal Polysaccharide-23 12/22/2014  . Tdap 10/25/2013   Past Surgical History:  Procedure Laterality Date  . APPENDECTOMY  1983  . HAND SURGERY Right 2007   Family History  Problem Relation Age of  Onset  . Cancer Father        SCC- head and neck   Socioeconomic History  . Marital status: Married    Spouse name: Not on file  . Number of children: Not on file  Social Needs  Occupational History  . Retired Psychologist, sport and exercise.  Tobacco Use  . Smoking status: Never Smoker  . Smokeless tobacco: Never Used  Substance and Sexual Activity  . Alcohol use: Yes    Alcohol/week: 1.0 oz    Types: 2 Standard drinks or equivalent per week  . Drug use: No  Other Topics Concern  . Not on file  Social History Narrative  . Not on file    ROS Constitutional: Denies fever, chills, weight loss/gain, headaches, insomnia,  night sweats or change in appetite. Does c/o fatigue. Eyes: Denies redness, blurred vision, diplopia, discharge, itchy or watery eyes.  ENT: Denies discharge, congestion, post nasal drip, epistaxis, sore throat, earache, hearing loss, dental pain, Tinnitus, Vertigo, Sinus pain or snoring.  Cardio: Denies chest pain, palpitations, irregular heartbeat, syncope, dyspnea, diaphoresis, orthopnea, PND, claudication or edema Respiratory: denies cough, dyspnea, DOE, pleurisy, hoarseness, laryngitis or wheezing.  Gastrointestinal: Denies dysphagia, heartburn, reflux, water brash, pain, cramps, nausea, vomiting, bloating, diarrhea, constipation, hematemesis, melena, hematochezia, jaundice or hemorrhoids Genitourinary: Denies dysuria, frequency, urgency, nocturia, hesitancy, discharge, hematuria or flank pain Musculoskeletal: Denies arthralgia, myalgia, stiffness, Jt. Swelling, pain, limp or strain/sprain. Denies Falls. Skin: Denies puritis, rash, hives, warts, acne, eczema or change in skin lesion Neuro: No weakness, tremor, incoordination, spasms, paresthesia or pain Psychiatric: Denies confusion, memory loss or sensory loss. Denies Depression. Endocrine: Denies change in weight, skin, hair change, nocturia, and paresthesia, diabetic polys, visual blurring or hyper / hypo glycemic  episodes.  Heme/Lymph: No excessive bleeding, bruising or enlarged lymph nodes.  Physical Exam  BP 134/82   Pulse 84   Temp 97.7 F (36.5 C)   Resp 18   Ht 5\' 9"  (1.753 m)   Wt 183 lb 6.4 oz (83.2 kg)   BMI 27.08 kg/m   General Appearance: Well nourished and well groomed and in no apparent distress.  Eyes: PERRLA, EOMs, conjunctiva no swelling or erythema, normal fundi and vessels. Sinuses: No frontal/maxillary tenderness ENT/Mouth: EACs patent / TMs  nl. Nares clear without erythema, swelling, mucoid exudates. Oral hygiene is good. No erythema, swelling, or exudate. Tongue normal, non-obstructing. Tonsils not swollen or erythematous. Hearing normal.  Neck: Supple, thyroid normal. No bruits, nodes or JVD. Respiratory: Respiratory effort normal.  BS equal and clear bilateral without rales, rhonci, wheezing or stridor. Cardio: Heart sounds are normal with regular rate and rhythm and no murmurs, rubs or gallops. Peripheral pulses are normal and equal bilaterally without edema. No aortic or femoral bruits. Chest: symmetric with normal excursions and percussion.  Abdomen: Soft, with Nl bowel sounds. Nontender, no guarding, rebound, hernias, masses, or organomegaly.  Lymphatics: Non tender without lymphadenopathy.  Genitourinary: No hernias.Testes nl. DRE - prostate nl for age -  smooth & firm w/o nodules. Musculoskeletal: Full ROM all peripheral extremities, joint stability, 5/5 strength, and normal gait. Skin: Warm and dry without rashes, lesions, cyanosis, clubbing or  ecchymosis.  Neuro: Cranial nerves intact, reflexes equal bilaterally. Normal muscle tone, no cerebellar symptoms. Sensation intact.  Pysch: Alert and oriented X 3 with normal affect, insight and judgment appropriate.   Assessment and Plan  1. Annual Preventative/Screening Exam   1. Encounter for general adult medical examination with abnormal findings   2. Essential hypertension  - EKG 12-Lead - [place], RETROPERITNL  ABD,  LTD - Urinalysis, Routine w reflex microscopic - Microalbumin / creatinine urine ratio - CBC with Differential/Platelet - BASIC METABOLIC PANEL WITH GFR - TSH  3. Hyperlipidemia, mixed  - EKG 12-Lead - [place], RETROPERITNL ABD,  LTD - Hepatic function panel - Lipid panel - TSH  4. Insulin resistance / Prediabetes  - EKG 12-Lead - [place], RETROPERITNL ABD,  LTD - Hemoglobin A1c - Insulin, random  5. Vitamin D deficiency  - VITAMIN D 25 Hydroxy   6. Migraine without aura and without status migrainosus, not intractable   7. Testosterone deficiency  - Testosterone  8. OSA (obstructive sleep apnea)   9. Prostate cancer screening   10. Elevated PSA  - PSA  11. Benign localized prostatic hyperplasia with lower urinary tract symptoms (LUTS)  - Urinalysis, Routine w reflex microscopic - PSA  12. Screening for colorectal cancer  - POC Hemoccult Bld/Stl   13. Screening for ischemic heart disease  - EKG 12-Lead  14. Screening for AAA (aortic abdominal aneurysm)  - [place], RETROPERITNL ABD,  LTD  15. Screening examination for pulmonary tuberculosis   16. Fatigue, unspecified type  - Iron,Total/Total Iron Binding Cap - Vitamin B12 - Testosterone - CBC with Differential/Platelet - BASIC METABOLIC PANEL WITH GFR - Magnesium - TSH  17. Medication management  - Urinalysis, Routine w reflex microscopic - Microalbumin / creatinine urine ratio - CBC with Differential/Platelet - BASIC METABOLIC PANEL WITH GFR - Hepatic function panel - Magnesium - Lipid panel - TSH - Hemoglobin A1c - Insulin, random - VITAMIN D 25 Hydroxyl        Patient was counseled in prudent diet, weight control to achieve/maintain BMI less than 25, BP monitoring, regular exercise and medications as discussed.  Discussed med effects and SE's. Routine screening labs and tests as requested with regular follow-up as recommended. Over 40 minutes of exam, counseling, chart review and high  complex critical decision making was performed

## 2017-03-08 LAB — INSULIN, RANDOM: INSULIN: 32.7 u[IU]/mL — AB (ref 2.0–19.6)

## 2017-03-08 LAB — VITAMIN B12: Vitamin B-12: 1000 pg/mL (ref 200–1100)

## 2017-03-08 LAB — HEMOGLOBIN A1C
EAG (MMOL/L): 5.7 (calc)
Hgb A1c MFr Bld: 5.2 % of total Hgb (ref <5.7)
MEAN PLASMA GLUCOSE: 103 (calc)

## 2017-03-08 LAB — CBC WITH DIFFERENTIAL/PLATELET
BASOS PCT: 1.6 %
Basophils Absolute: 93 cells/uL (ref 0–200)
Eosinophils Absolute: 400 cells/uL (ref 15–500)
Eosinophils Relative: 6.9 %
HEMATOCRIT: 49.8 % (ref 38.5–50.0)
HEMOGLOBIN: 16.3 g/dL (ref 13.2–17.1)
LYMPHS ABS: 1114 {cells}/uL (ref 850–3900)
MCH: 26.1 pg — ABNORMAL LOW (ref 27.0–33.0)
MCHC: 32.7 g/dL (ref 32.0–36.0)
MCV: 79.8 fL — AB (ref 80.0–100.0)
MPV: 9.4 fL (ref 7.5–12.5)
Monocytes Relative: 5.9 %
Neutro Abs: 3851 cells/uL (ref 1500–7800)
Neutrophils Relative %: 66.4 %
Platelets: 276 10*3/uL (ref 140–400)
RBC: 6.24 10*6/uL — AB (ref 4.20–5.80)
RDW: 14 % (ref 11.0–15.0)
Total Lymphocyte: 19.2 %
WBC mixed population: 342 cells/uL (ref 200–950)
WBC: 5.8 10*3/uL (ref 3.8–10.8)

## 2017-03-08 LAB — MICROALBUMIN / CREATININE URINE RATIO
Creatinine, Urine: 40 mg/dL (ref 20–320)
Microalb, Ur: <0.2 mg/dL

## 2017-03-08 LAB — BASIC METABOLIC PANEL WITH GFR
BUN: 13 mg/dL (ref 7–25)
CALCIUM: 9.3 mg/dL (ref 8.6–10.3)
CHLORIDE: 103 mmol/L (ref 98–110)
CO2: 29 mmol/L (ref 20–32)
Creat: 1.27 mg/dL (ref 0.70–1.33)
GFR, EST AFRICAN AMERICAN: 73 mL/min/{1.73_m2} (ref >=60)
GFR, EST NON AFRICAN AMERICAN: 63 mL/min/{1.73_m2} (ref >=60)
Glucose, Bld: 82 mg/dL (ref 65–99)
Potassium: 4.4 mmol/L (ref 3.5–5.3)
Sodium: 141 mmol/L (ref 135–146)

## 2017-03-08 LAB — HEPATIC FUNCTION PANEL
AG Ratio: 1.9 (calc) (ref 1.0–2.5)
ALBUMIN MSPROF: 4.3 g/dL (ref 3.6–5.1)
ALT: 47 U/L — ABNORMAL HIGH (ref 9–46)
AST: 35 U/L (ref 10–35)
Alkaline phosphatase (APISO): 56 U/L (ref 40–115)
BILIRUBIN DIRECT: 0.1 mg/dL (ref 0.0–0.2)
BILIRUBIN TOTAL: 0.4 mg/dL (ref 0.2–1.2)
GLOBULIN: 2.3 g/dL (ref 1.9–3.7)
Indirect Bilirubin: 0.3 mg/dL (calc) (ref 0.2–1.2)
Total Protein: 6.6 g/dL (ref 6.1–8.1)

## 2017-03-08 LAB — URINALYSIS, ROUTINE W REFLEX MICROSCOPIC
BILIRUBIN URINE: NEGATIVE
GLUCOSE, UA: NEGATIVE
HGB URINE DIPSTICK: NEGATIVE
KETONES UR: NEGATIVE
Leukocytes, UA: NEGATIVE
Nitrite: NEGATIVE
PROTEIN: NEGATIVE
Specific Gravity, Urine: 1.007 (ref 1.001–1.03)
pH: 6.5 (ref 5.0–8.0)

## 2017-03-08 LAB — LIPID PANEL
Cholesterol: 146 mg/dL (ref <200)
HDL: 37 mg/dL — ABNORMAL LOW (ref >40)
LDL CHOLESTEROL (CALC): 79 mg/dL
Non-HDL Cholesterol (Calc): 109 mg/dL (calc) (ref <130)
TRIGLYCERIDES: 209 mg/dL — AB (ref <150)
Total CHOL/HDL Ratio: 3.9 (calc) (ref <5.0)

## 2017-03-08 LAB — IRON, TOTAL/TOTAL IRON BINDING CAP
%SAT: 12 % (calc) — ABNORMAL LOW (ref 15–60)
IRON: 44 ug/dL — AB (ref 50–180)
TIBC: 359 ug/dL (ref 250–425)

## 2017-03-08 LAB — TSH: TSH: 2.88 m[IU]/L (ref 0.40–4.50)

## 2017-03-08 LAB — TESTOSTERONE: Testosterone: 243 ng/dL — ABNORMAL LOW (ref 250–827)

## 2017-03-08 LAB — VITAMIN D 25 HYDROXY (VIT D DEFICIENCY, FRACTURES): VIT D 25 HYDROXY: 85 ng/mL (ref 30–100)

## 2017-03-08 LAB — MAGNESIUM: MAGNESIUM: 2 mg/dL (ref 1.5–2.5)

## 2017-03-08 LAB — PSA: PSA: 2.9 ng/mL (ref <=4.0)

## 2017-03-12 ENCOUNTER — Encounter: Payer: Self-pay | Admitting: Internal Medicine

## 2017-03-15 ENCOUNTER — Encounter: Payer: Self-pay | Admitting: *Deleted

## 2017-03-24 ENCOUNTER — Other Ambulatory Visit: Payer: Self-pay | Admitting: Internal Medicine

## 2017-03-27 NOTE — Telephone Encounter (Signed)
Rx called in 

## 2017-04-05 ENCOUNTER — Other Ambulatory Visit: Payer: Self-pay | Admitting: Internal Medicine

## 2017-04-13 ENCOUNTER — Other Ambulatory Visit: Payer: Self-pay | Admitting: Internal Medicine

## 2017-06-13 DIAGNOSIS — E663 Overweight: Secondary | ICD-10-CM | POA: Insufficient documentation

## 2017-06-13 NOTE — Progress Notes (Signed)
FOLLOW UP  Assessment and Plan:   Hypertension Well controlled with current medications  Monitor blood pressure at home; patient to call if consistently greater than 130/80 Continue DASH diet.   Reminder to go to the ER if any CP, SOB, nausea, dizziness, severe HA, changes vision/speech, left arm numbness and tingling and jaw pain.  Cholesterol Currently at LDL goal; continue statin; diet discussed for trigs Continue low cholesterol diet and exercise.  Check lipid panel.   Other abnormal glucose Recent A1Cs at goal Continue diet and exercise.  Perform daily foot/skin check, notify office of any concerning changes.  Defer A1C; check BMP  Overweight Long discussion about weight loss, diet, and exercise Recommended diet heavy in fruits and veggies and low in animal meats, cheeses, and dairy products, appropriate calorie intake Discussed ideal weight for height and initial weight goal () Patient will work on walking more Will follow up in 3 months  Vitamin D Def At goal at last visit; continue supplementation to maintain goal of 70-100 Defer Vit D level  Hypogonadism - continue to monitor, states medication is helping with symptoms of low T.   Continue diet and meds as discussed. Further disposition pending results of labs. Discussed med's effects and SE's.   Over 30 minutes of exam, counseling, chart review, and critical decision making was performed.   Future Appointments  Date Time Provider [place]  09/20/2017  9:30 AM [name], MD [facility] None  04/13/2018 10:00 AM [name], MD [facility] None    ----------------------------------------------------------------------------------------------------------------------  HPI 57 y.o. male  presents for 3 month follow up on hypertension, cholesterol, glucose management, weight and vitamin D deficiency.   BMI is Body mass index is 27.47 kg/m., he has been working on diet and exercise. Wt Readings  from Last 3 Encounters:  06/14/17 186 lb (84.4 kg)  03/07/17 183 lb 6.4 oz (83.2 kg)  11/24/16 181 lb 9.6 oz (82.4 kg)   His blood pressure has been controlled at home, today their BP is BP: 114/80  He does workout. He denies chest pain, shortness of breath, dizziness.   He is on cholesterol medication (crestor 5 mg daily) and denies myalgias. His LDL cholesterol is at goal; trigs remain elevated. The cholesterol last visit was:   Lab Results  Component Value Date   CHOL 146 03/07/2017   HDL 37 (L) 03/07/2017   LDLCALC 57 08/17/2016   TRIG 209 (H) 03/07/2017   CHOLHDL 3.9 03/07/2017    He has been working on diet and exercise for glucose management for hx of elevated A1C/insulin resistance, and denies foot ulcerations, increased appetite, nausea, paresthesia of the feet, polydipsia, polyuria, visual disturbances, vomiting and weight loss. Last A1C in the office was:  Lab Results  Component Value Date   HGBA1C 5.2 03/07/2017   Patient is on Vitamin D supplement and at goal:   Lab Results  Component Value Date   VD25OH 74 03/07/2017     He has a history of testosterone deficiency and is on testosterone replacement. He states that the testosterone helps with his energy, libido, muscle mass. Lab Results  Component Value Date   TESTOSTERONE 243 (L) 03/07/2017      Current Medications:  Current Outpatient Medications on File Prior to Visit  Medication Sig  . butalbital-acetaminophen-caffeine (FIORICET, ESGIC) 50-325-40 MG tablet TAKE 1 TO 2 TABLETS BY MOUTH UP TO 4 TIMES DAILY (Patient taking differently: as needed)  . fluticasone (FLONASE) 50 MCG/ACT nasal spray Place into both nostrils daily.  Marland Kitchen  KRILL OIL PO Take by mouth daily. Reported on 07/15/2015  . lisinopril (PRINIVIL,ZESTRIL) 20 MG tablet TAKE 1 TABLET BY MOUTH  DAILY  . OMEPRAZOLE PO Take 20 mg by mouth daily. Reported on 07/15/2015  . OVER THE COUNTER MEDICATION Zyrtec OTC 1 tablet daily PRN.  . pregabalin (LYRICA) 75 MG  capsule Take 1 capsule (75 mg total) by mouth 2 (two) times daily. (Patient taking differently: Take 75 mg by mouth as needed. )  . rosuvastatin (CRESTOR) 5 MG tablet TAKE 1 TABLET BY MOUTH  DAILY FOR CHOLESTEROL  . SYRINGE-NEEDLE, DISP, 3 ML (B-D 3CC LUER-LOK SYR 22GX1") 22G X 1" 3 ML MISC Checks sugar 1 daily for fluctuating sugars for prediabetes  . testosterone cypionate (DEPOTESTOSTERONE CYPIONATE) 200 MG/ML injection INJECT 2 MLS IN THE MUSCLE EVERY 2 WEEKS  . traMADol (ULTRAM) 50 MG tablet TAKE 1 TABLET EVERY 4 HOURS AS NEEDED FOR SEVERE PAIN  . Triamcinolone Acetonide (NASACORT AQ NA) Place into the nose. Reported on 07/15/2015   No current facility-administered medications on file prior to visit.      Allergies: No Known Allergies   Medical History:  Past Medical History:  Diagnosis Date  . Allergy   . Hyperlipidemia   . Hypertension   . OSA (obstructive sleep apnea)    Family history- Reviewed and unchanged Social history- Reviewed and unchanged   Review of Systems:  Review of Systems  Constitutional: Negative for malaise/fatigue and weight loss.  HENT: Negative for hearing loss and tinnitus.   Eyes: Negative for blurred vision and double vision.  Respiratory: Negative for cough, shortness of breath and wheezing.   Cardiovascular: Negative for chest pain, palpitations, orthopnea, claudication and leg swelling.  Gastrointestinal: Negative for abdominal pain, blood in stool, constipation, diarrhea, heartburn, melena, nausea and vomiting.  Genitourinary: Negative.   Musculoskeletal: Negative for joint pain and myalgias.  Skin: Negative for rash.  Neurological: Negative for dizziness, tingling, sensory change, weakness and headaches.  Endo/Heme/Allergies: Negative for polydipsia.  Psychiatric/Behavioral: Negative.   All other systems reviewed and are negative.     Physical Exam: BP 114/80   Pulse 86   Temp (!) 97.3 F (36.3 C)   Ht 5\' 9"  (1.753 m)   Wt 186 lb  (84.4 kg)   SpO2 93%   BMI 27.47 kg/m  Wt Readings from Last 3 Encounters:  06/14/17 186 lb (84.4 kg)  03/07/17 183 lb 6.4 oz (83.2 kg)  11/24/16 181 lb 9.6 oz (82.4 kg)   General Appearance: Well nourished, in no apparent distress. Eyes: PERRLA, EOMs, conjunctiva no swelling or erythema Sinuses: No Frontal/maxillary tenderness ENT/Mouth: Ext aud canals clear, TMs without erythema, bulging. No erythema, swelling, or exudate on post pharynx.  Tonsils not swollen or erythematous. Hearing normal.  Neck: Supple, thyroid normal.  Respiratory: Respiratory effort normal, BS equal bilaterally without rales, rhonchi, wheezing or stridor.  Cardio: RRR with no MRGs. Brisk peripheral pulses without edema.  Abdomen: Soft, + BS.  Non tender, no guarding, rebound, hernias, masses. Lymphatics: Non tender without lymphadenopathy.  Musculoskeletal: Full ROM, 5/5 strength, Normal gait Skin: Warm, dry without rashes, lesions, ecchymosis.  Neuro: Cranial nerves intact. No cerebellar symptoms.  Psych: Awake and oriented X 3, normal affect, Insight and Judgment appropriate.    Izora Ribas, NP 9:55 AM [facility]

## 2017-06-14 ENCOUNTER — Ambulatory Visit: Payer: 59 | Admitting: Adult Health

## 2017-06-14 ENCOUNTER — Encounter: Payer: Self-pay | Admitting: Adult Health

## 2017-06-14 VITALS — BP 114/80 | HR 86 | Temp 97.3°F | Ht 69.0 in | Wt 186.0 lb

## 2017-06-14 DIAGNOSIS — E8881 Metabolic syndrome: Secondary | ICD-10-CM

## 2017-06-14 DIAGNOSIS — E782 Mixed hyperlipidemia: Secondary | ICD-10-CM | POA: Diagnosis not present

## 2017-06-14 DIAGNOSIS — E349 Endocrine disorder, unspecified: Secondary | ICD-10-CM

## 2017-06-14 DIAGNOSIS — E559 Vitamin D deficiency, unspecified: Secondary | ICD-10-CM | POA: Diagnosis not present

## 2017-06-14 DIAGNOSIS — M67911 Unspecified disorder of synovium and tendon, right shoulder: Secondary | ICD-10-CM

## 2017-06-14 DIAGNOSIS — I10 Essential (primary) hypertension: Secondary | ICD-10-CM | POA: Diagnosis not present

## 2017-06-14 DIAGNOSIS — Z79899 Other long term (current) drug therapy: Secondary | ICD-10-CM | POA: Diagnosis not present

## 2017-06-14 DIAGNOSIS — E663 Overweight: Secondary | ICD-10-CM

## 2017-06-14 NOTE — Patient Instructions (Signed)
Aim for 7+ servings of fruits and vegetables daily  80+ fluid ounces of water or unsweet tea for healthy kidneys  Limit alcohol  Limit animal fats in diet for cholesterol and heart health - choose grass fed whenever available  Aim for low stress - take time to unwind and care for your mental health  Aim for 150 min of moderate intensity exercise weekly for heart health, and weights twice weekly for bone health  Aim for 7-9 hours of sleep daily       When it comes to diets, agreement about the perfect plan isn't easy to find, even among the experts. Experts at the [employer] developed an idea known as the Healthy Eating Plate. Just imagine a plate divided into logical, healthy portions.  The emphasis is on diet quality:  Load up on vegetables and fruits - one-half of your plate: Aim for color and variety, and remember that potatoes don't count.  Go for whole grains - one-quarter of your plate: Whole wheat, barley, wheat berries, quinoa, oats, brown rice, and foods made with them. If you want pasta, go with whole wheat pasta.  Protein power - one-quarter of your plate: Fish, chicken, beans, and nuts are all healthy, versatile protein sources. Limit red meat.  The diet, however, does go beyond the plate, offering a few other suggestions.  Use healthy plant oils, such as olive, canola, soy, corn, sunflower and peanut. Check the labels, and avoid partially hydrogenated oil, which have unhealthy trans fats.  If you're thirsty, drink water. Coffee and tea are good in moderation, but skip sugary drinks and limit milk and dairy products to one or two daily servings.  The type of carbohydrate in the diet is more important than the amount. Some sources of carbohydrates, such as vegetables, fruits, whole grains, and beans-are healthier than others.  Finally, stay active.

## 2017-06-15 LAB — HEPATIC FUNCTION PANEL
AG RATIO: 1.8 (calc) (ref 1.0–2.5)
ALKALINE PHOSPHATASE (APISO): 52 U/L (ref 40–115)
ALT: 33 U/L (ref 9–46)
AST: 26 U/L (ref 10–35)
Albumin: 4.4 g/dL (ref 3.6–5.1)
BILIRUBIN DIRECT: 0.1 mg/dL (ref 0.0–0.2)
BILIRUBIN INDIRECT: 0.4 mg/dL (ref 0.2–1.2)
GLOBULIN: 2.4 g/dL (ref 1.9–3.7)
TOTAL PROTEIN: 6.8 g/dL (ref 6.1–8.1)
Total Bilirubin: 0.5 mg/dL (ref 0.2–1.2)

## 2017-06-15 LAB — LIPID PANEL
CHOL/HDL RATIO: 3.6 (calc) (ref <5.0)
Cholesterol: 141 mg/dL (ref <200)
HDL: 39 mg/dL — AB (ref >40)
LDL Cholesterol (Calc): 79 mg/dL (calc)
NON-HDL CHOLESTEROL (CALC): 102 mg/dL (ref <130)
Triglycerides: 126 mg/dL (ref <150)

## 2017-06-15 LAB — CBC WITH DIFFERENTIAL/PLATELET
BASOS ABS: 69 {cells}/uL (ref 0–200)
Basophils Relative: 1.1 %
EOS ABS: 302 {cells}/uL (ref 15–500)
Eosinophils Relative: 4.8 %
HCT: 49.8 % (ref 38.5–50.0)
Hemoglobin: 16.3 g/dL (ref 13.2–17.1)
Lymphs Abs: 1355 cells/uL (ref 850–3900)
MCH: 26.5 pg — AB (ref 27.0–33.0)
MCHC: 32.7 g/dL (ref 32.0–36.0)
MCV: 80.8 fL (ref 80.0–100.0)
MONOS PCT: 7.2 %
MPV: 9.4 fL (ref 7.5–12.5)
Neutro Abs: 4120 cells/uL (ref 1500–7800)
Neutrophils Relative %: 65.4 %
PLATELETS: 311 10*3/uL (ref 140–400)
RBC: 6.16 10*6/uL — ABNORMAL HIGH (ref 4.20–5.80)
RDW: 13.9 % (ref 11.0–15.0)
TOTAL LYMPHOCYTE: 21.5 %
WBC: 6.3 10*3/uL (ref 3.8–10.8)
WBCMIX: 454 {cells}/uL (ref 200–950)

## 2017-06-15 LAB — BASIC METABOLIC PANEL WITH GFR
BUN: 13 mg/dL (ref 7–25)
CHLORIDE: 101 mmol/L (ref 98–110)
CO2: 32 mmol/L (ref 20–32)
CREATININE: 1.1 mg/dL (ref 0.70–1.33)
Calcium: 9.7 mg/dL (ref 8.6–10.3)
GFR, Est African American: 87 mL/min/{1.73_m2} (ref >=60)
GFR, Est Non African American: 75 mL/min/{1.73_m2} (ref >=60)
GLUCOSE: 87 mg/dL (ref 65–99)
Potassium: 4.5 mmol/L (ref 3.5–5.3)
SODIUM: 141 mmol/L (ref 135–146)

## 2017-06-15 LAB — TSH: TSH: 3.06 mIU/L (ref 0.40–4.50)

## 2017-06-24 IMAGING — MR MR LUMBAR SPINE W/O CM
5 series · 44 of 48 positions shown · non-contrast
Comparison: CT scan 01/23/2011.

CLINICAL DATA: Bilateral buttock pain and right leg pain. Symptoms
are chronic. No prior surgery.

EXAM:
MRI LUMBAR SPINE WITHOUT CONTRAST
TECHNIQUE: Multiplanar, multisequence MR imaging of the lumbar spine was
performed. No intravenous contrast was administered.

[Series 3: T2 · sagittal · 4.0mm · 0.88mm/px · 6 of 13 slices shown (1 of 2)]
[im 1/13]
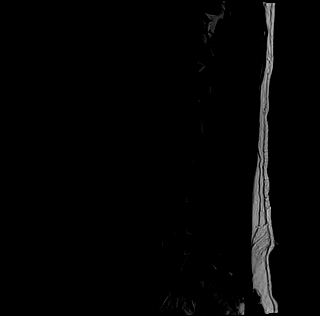
[im 3/13]
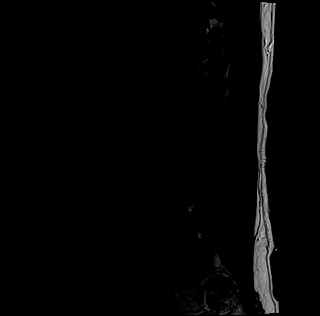
[im 5/13]
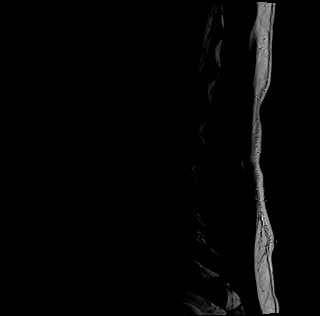
[im 8/13]
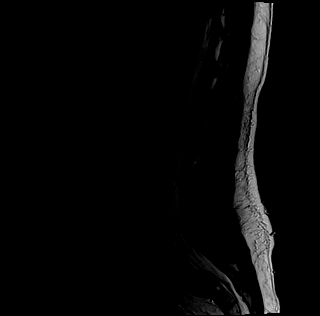
[im 10/13]
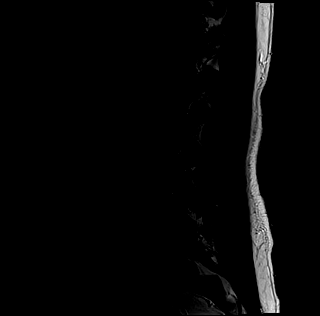
[im 13/13]
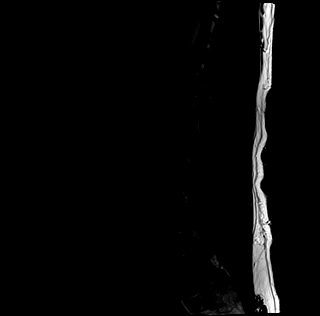

[Series 4: tirm sag · sagittal · 4.0mm · 0.55mm/px · 6 of 13 slices shown]
[im 1/13]
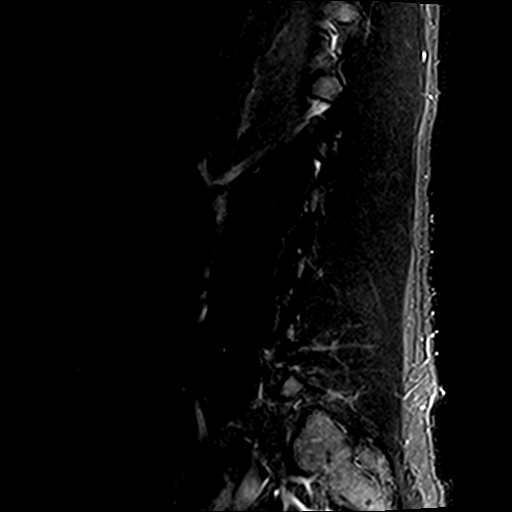
[im 3/13]
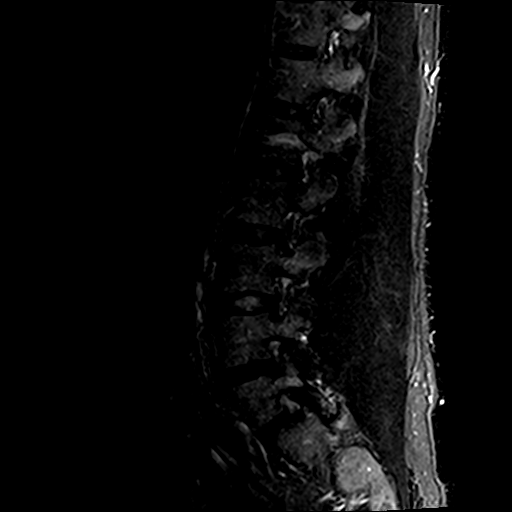
[im 5/13]
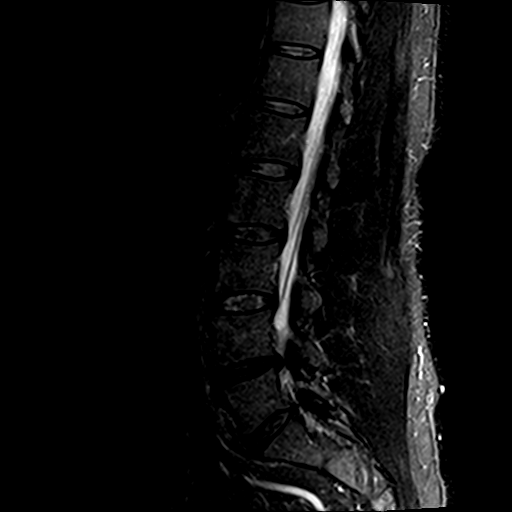
[im 8/13]
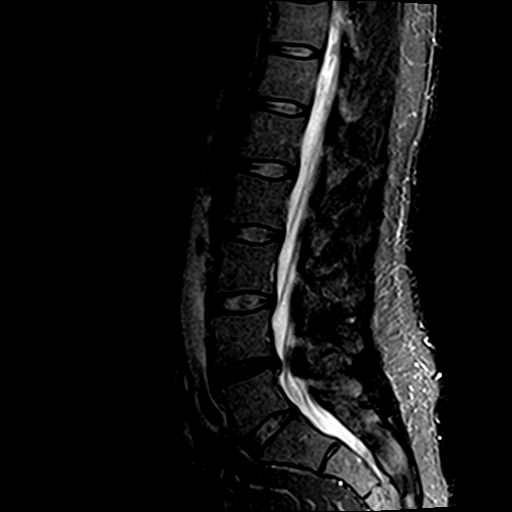
[im 10/13]
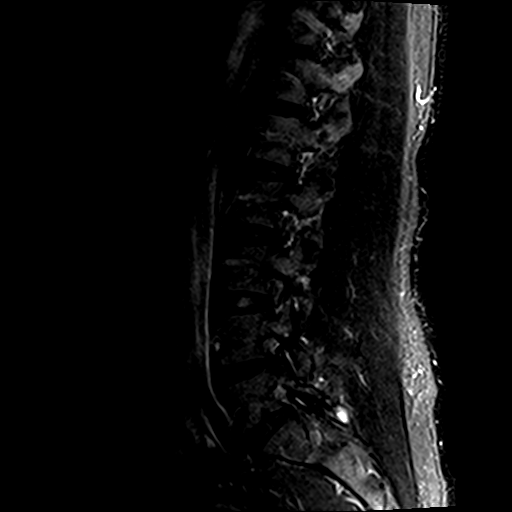
[im 13/13]
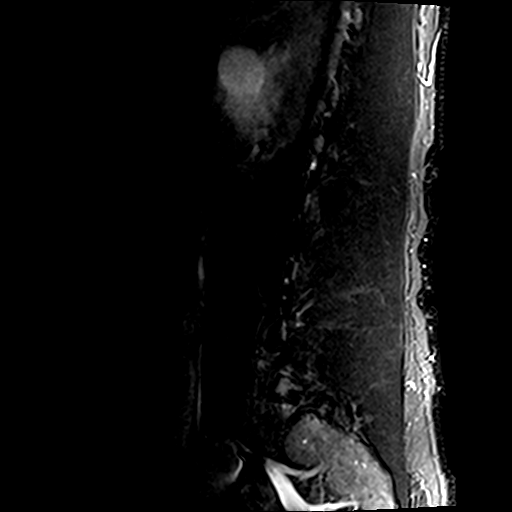

[Series 5: T1 · sagittal · 4.0mm · 0.88mm/px · 6 of 13 slices shown (1 of 2)]
[im 1/13]
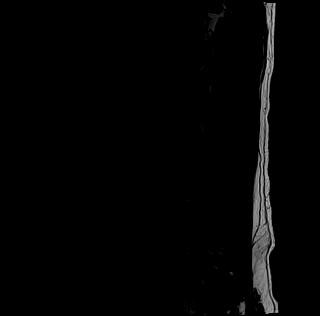
[im 3/13]
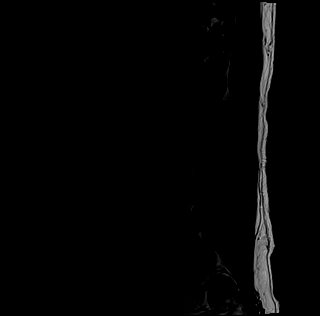
[im 5/13]
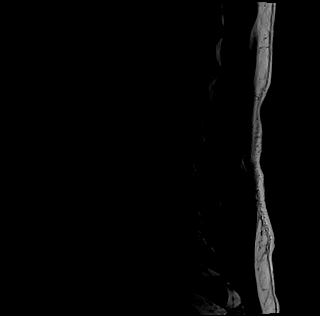
[im 8/13]
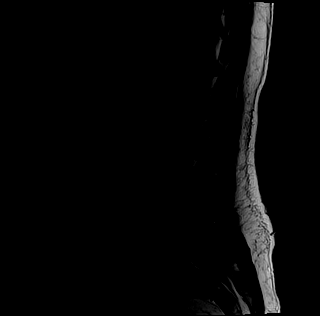
[im 10/13]
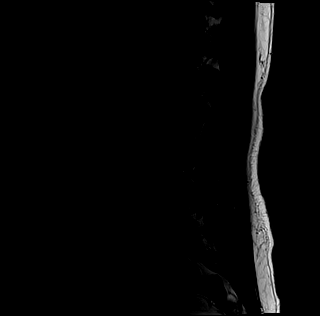
[im 13/13]
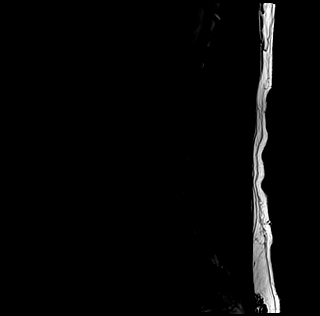

[Series 6: T1 · axial · 4.0mm · 0.70mm/px · z∈[-88,+109]mm · 11 of 34 slices shown (2 of 2)]
[im 1/34]
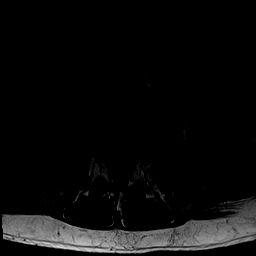
[im 3/34]
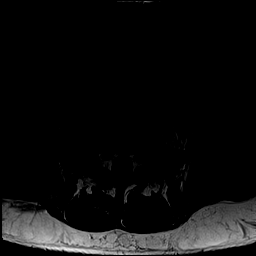
[im 5/34]
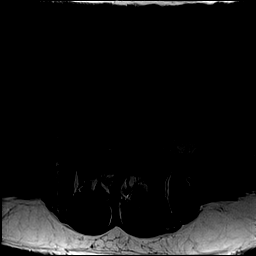
[im 8/34]
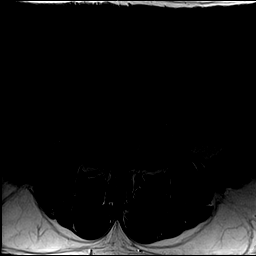
[im 10/34]
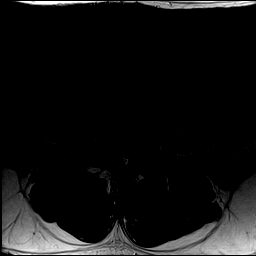
[im 15/34]
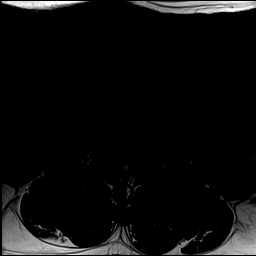
[im 17/34]
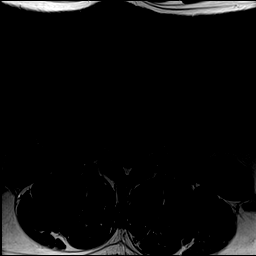
[im 19/34]
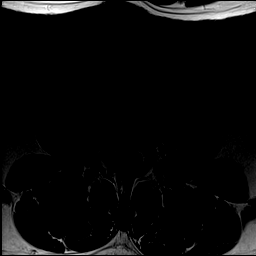
[im 24/34]
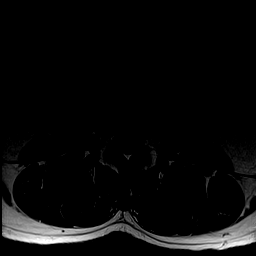
[im 29/34]
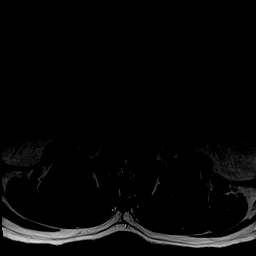
[im 34/34]
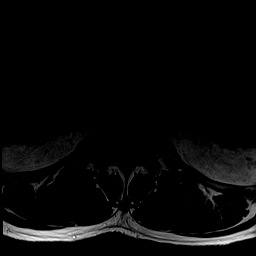

[Series 7: T2 · axial · 4.0mm · 0.70mm/px · z∈[-88,+109]mm · 15 of 34 slices shown (2 of 2)]
[im 1/34]
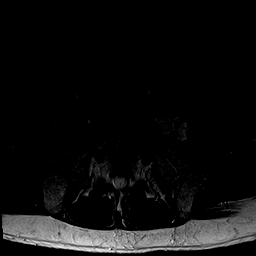
[im 3/34]
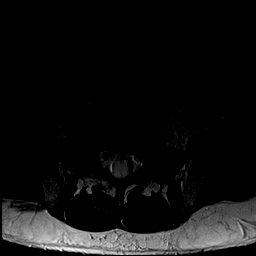
[im 5/34]
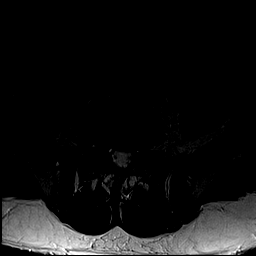
[im 8/34]
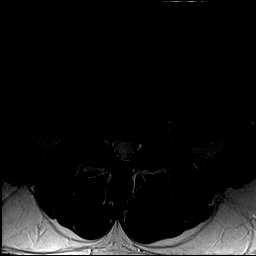
[im 10/34]
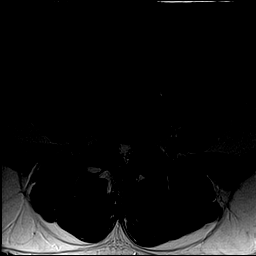
[im 12/34]
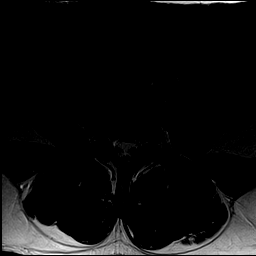
[im 15/34]
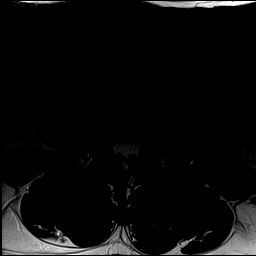
[im 17/34]
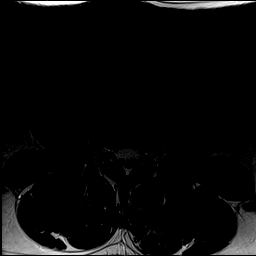
[im 19/34]
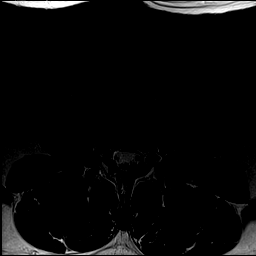
[im 22/34]
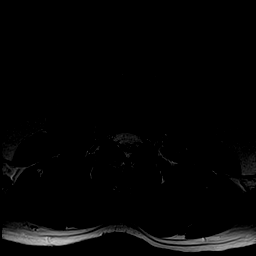
[im 24/34]
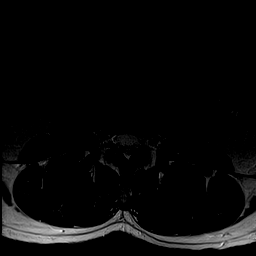
[im 26/34]
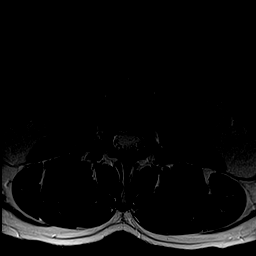
[im 29/34]
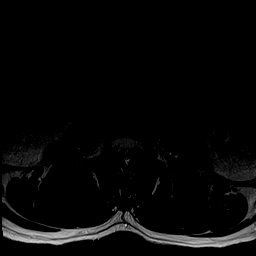
[im 31/34]
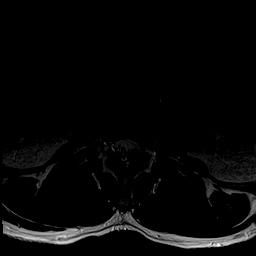
[im 34/34]
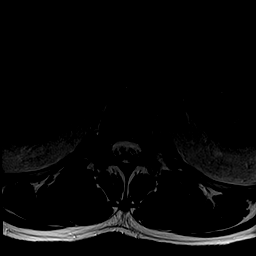

[44 of 48 positions shown; findings below may reference images not displayed]

FINDINGS: Segmentation: 5 lumbar type vertebral bodies. The last full
intervertebral disc space is labeled L5-S1.

Alignment: Minimal degenerative anterolisthesis of L5. No pars
defects.

Vertebrae: Normal marrow signal. No bone lesions or fractures.
Benign S1 hemangioma.

Conus medullaris: Extends to the L1 level and appears normal.

Paraspinal and other soft tissues: No significant findings

Disc levels:

L1-2:  No significant findings.

L2-3:  No significant findings.

L3-4:  No significant findings.

L4-5: Disc desiccation and mild degeneration. There is a broad-based
bulging uncovered annulus and moderate facet disease contributing to
mild bilateral lateral recess stenosis and early spinal stenosis. No
foraminal stenosis. A right-sided synovial cyst is noted projecting
off the posterior aspect of the right facet joint.

L5-S1: Advanced facet disease but no disc protrusions, spinal or
foraminal stenosis.
IMPRESSION: 1. Broad-based bulging uncovered discs at L4-5 in combination with
moderate facet disease contributing to early spinal stenosis and
mild bilateral lateral recess stenosis.
2. Degenerative, non isthmic spondylolisthesis at L5.

## 2017-06-29 ENCOUNTER — Other Ambulatory Visit: Payer: Self-pay | Admitting: Internal Medicine

## 2017-09-08 ENCOUNTER — Other Ambulatory Visit: Payer: Self-pay | Admitting: Internal Medicine

## 2017-09-20 ENCOUNTER — Ambulatory Visit: Payer: 59 | Admitting: Internal Medicine

## 2017-09-20 ENCOUNTER — Encounter: Payer: Self-pay | Admitting: Internal Medicine

## 2017-09-20 VITALS — BP 122/86 | HR 84 | Temp 97.5°F | Resp 16 | Ht 69.0 in | Wt 183.6 lb

## 2017-09-20 DIAGNOSIS — E8881 Metabolic syndrome: Secondary | ICD-10-CM | POA: Diagnosis not present

## 2017-09-20 DIAGNOSIS — E349 Endocrine disorder, unspecified: Secondary | ICD-10-CM

## 2017-09-20 DIAGNOSIS — E782 Mixed hyperlipidemia: Secondary | ICD-10-CM

## 2017-09-20 DIAGNOSIS — I10 Essential (primary) hypertension: Secondary | ICD-10-CM | POA: Diagnosis not present

## 2017-09-20 DIAGNOSIS — K219 Gastro-esophageal reflux disease without esophagitis: Secondary | ICD-10-CM | POA: Diagnosis not present

## 2017-09-20 DIAGNOSIS — E559 Vitamin D deficiency, unspecified: Secondary | ICD-10-CM | POA: Diagnosis not present

## 2017-09-20 DIAGNOSIS — Z79899 Other long term (current) drug therapy: Secondary | ICD-10-CM | POA: Diagnosis not present

## 2017-09-20 NOTE — Patient Instructions (Signed)

## 2017-09-20 NOTE — Progress Notes (Signed)
This very nice 57 y.o. MWM presents for 3 month follow up with HTN, HLD, Pre-Diabetes and Vitamin D Deficiency.  Patient's GERD is controlled on his meds. He endorses improved restorative sleep on CPAP.      Patient is treated for HTN & BP has been controlled at home. Today's BP is at goal.  122/86. Patient has had no complaints of any cardiac type chest pain, palpitations, dyspnea / orthopnea / PND, dizziness, claudication, or dependent edema.     Hyperlipidemia is controlled with diet & meds. Patient denies myalgias or other med SE's. Last Lipids were at goal: Lab Results  Component Value Date   CHOL 141 06/14/2017   HDL 39 (L) 06/14/2017   LDLCALC 79 06/14/2017   TRIG 126 06/14/2017   CHOLHDL 3.6 06/14/2017      Also, the patient has history of PreDiabetes & Insulin Resistance and has had no symptoms of reactive hypoglycemia, diabetic polys, paresthesias or visual blurring.  Last A1c was at goal: Lab Results  Component Value Date   HGBA1C 5.2 03/07/2017      Further, the patient also has history of Vitamin D Deficiency and supplements vitamin D without any suspected side-effects. Last vitamin D was at goal:  Lab Results  Component Value Date   VD25OH 85 03/07/2017   Current Outpatient Medications on File Prior to Visit  Medication Sig  . butalbital-acetaminophen-caffeine (FIORICET, ESGIC) 50-325-40 MG tablet Take 1 to 2 tablets 4 x / day Only if needed for severe Headache  . fluticasone (FLONASE) 50 MCG/ACT nasal spray Place into both nostrils daily.  Marland Kitchen KRILL OIL PO Take by mouth daily. Reported on 07/15/2015  . lisinopril (PRINIVIL,ZESTRIL) 20 MG tablet TAKE 1 TABLET BY MOUTH  DAILY  . OMEPRAZOLE PO Take 20 mg by mouth daily. Reported on 07/15/2015  . OVER THE COUNTER MEDICATION Zyrtec OTC 1 tablet daily PRN.  . pregabalin (LYRICA) 75 MG capsule Take 1 capsule (75 mg total) by mouth 2 (two) times daily. (Patient taking differently: Take 75 mg by mouth as needed. )  .  rosuvastatin (CRESTOR) 5 MG tablet TAKE 1 TABLET BY MOUTH  DAILY FOR CHOLESTEROL  . SYRINGE-NEEDLE, DISP, 3 ML (B-D 3CC LUER-LOK SYR 22GX1") 22G X 1" 3 ML MISC Checks sugar 1 daily for fluctuating sugars for prediabetes  . testosterone cypionate (DEPOTESTOSTERONE CYPIONATE) 200 MG/ML injection INJECT 2 MLS IN THE MUSCLE EVERY 2 WEEKS  . traMADol (ULTRAM) 50 MG tablet Take 1/2 to 1 tablet 4 x / day if needed for Severe Pain  . Triamcinolone Acetonide (NASACORT AQ NA) Place into the nose. Reported on 07/15/2015   No current facility-administered medications on file prior to visit.    No Known Allergies PMHx:   Past Medical History:  Diagnosis Date  . Allergy   . Hyperlipidemia   . Hypertension   . OSA (obstructive sleep apnea)    Immunization History  Administered Date(s) Administered  . Influenza,inj,quad, With Preservative 01/29/2016  . Influenza-Unspecified 01/30/2017  . PPD Test 10/25/2013, 12/22/2014, 01/29/2016  . Pneumococcal Polysaccharide-23 12/22/2014  . Tdap 10/25/2013   Past Surgical History:  Procedure Laterality Date  . APPENDECTOMY  1983  . HAND SURGERY Right 2007   FHx:    Reviewed / unchanged  SHx:    Reviewed / unchanged   Systems Review:  Constitutional: Denies fever, chills, wt changes, headaches, insomnia, fatigue, night sweats, change in appetite. Eyes: Denies redness, blurred vision, diplopia, discharge, itchy, watery eyes.  ENT:  Denies discharge, congestion, post nasal drip, epistaxis, sore throat, earache, hearing loss, dental pain, tinnitus, vertigo, sinus pain, snoring.  CV: Denies chest pain, palpitations, irregular heartbeat, syncope, dyspnea, diaphoresis, orthopnea, PND, claudication or edema. Respiratory: denies cough, dyspnea, DOE, pleurisy, hoarseness, laryngitis, wheezing.  Gastrointestinal: Denies dysphagia, odynophagia, heartburn, reflux, water brash, abdominal pain or cramps, nausea, vomiting, bloating, diarrhea, constipation, hematemesis,  melena, hematochezia  or hemorrhoids. Genitourinary: Denies dysuria, frequency, urgency, nocturia, hesitancy, discharge, hematuria or flank pain. Musculoskeletal: Denies arthralgias, myalgias, stiffness, jt. swelling, pain, limping or strain/sprain.  Skin: Denies pruritus, rash, hives, warts, acne, eczema or change in skin lesion(s). Neuro: No weakness, tremor, incoordination, spasms, paresthesia or pain. Psychiatric: Denies confusion, memory loss or sensory loss. Endo: Denies change in weight, skin or hair change.  Heme/Lymph: No excessive bleeding, bruising or enlarged lymph nodes.  Physical Exam  BP 122/86   Pulse 84   Temp (!) 97.5 F (36.4 C)   Resp 16   Ht 5\' 9"  (1.753 m)   Wt 183 lb 9.6 oz (83.3 kg)   SpO2 96%   BMI 27.11 kg/m   Appears  well nourished, well groomed  and in no distress.  Eyes: PERRLA, EOMs, conjunctiva no swelling or erythema. Sinuses: No frontal/maxillary tenderness ENT/Mouth: EAC's clear, TM's nl w/o erythema, bulging. Nares clear w/o erythema, swelling, exudates. Oropharynx clear without erythema or exudates. Oral hygiene is good. Tongue normal, non obstructing. Hearing intact.  Neck: Supple. Thyroid not palpable. Car 2+/2+ without bruits, nodes or JVD. Chest: Respirations nl with BS clear & equal w/o rales, rhonchi, wheezing or stridor.  Cor: Heart sounds normal w/ regular rate and rhythm without sig. murmurs, gallops, clicks or rubs. Peripheral pulses normal and equal  without edema.  Abdomen: Soft & bowel sounds normal. Non-tender w/o guarding, rebound, hernias, masses or organomegaly.  Lymphatics: Unremarkable.  Musculoskeletal: Full ROM all peripheral extremities, joint stability, 5/5 strength and normal gait.  Skin: Warm, dry without exposed rashes, lesions or ecchymosis apparent.  Neuro: Cranial nerves intact, reflexes equal bilaterally. Sensory-motor testing grossly intact. Tendon reflexes grossly intact.  Pysch: Alert & oriented x 3.  Insight and  judgement nl & appropriate. No ideations.  Assessment and Plan:  1. Essential hypertension  - Continue medication, monitor blood pressure at home.  - Continue DASH diet.  Reminder to go to the ER if any CP,  SOB, nausea, dizziness, severe HA, changes vision/speech.  - CBC with Differential/Platelet - COMPLETE METABOLIC PANEL WITH GFR - Magnesium - TSH  2. Hyperlipidemia, mixed  - Continue diet/meds, exercise,& lifestyle modifications.  - Continue monitor periodic cholesterol/liver & renal functions   - Lipid panel - TSH  3. Insulin resistance / Prediabetes  - Continue diet, exercise, lifestyle modifications.  - Monitor appropriate labs.  - Hemoglobin A1c - Insulin, random  4. Vitamin D deficiency  - Continue supplementation.          - VITAMIN D 25 Hydroxyl  5. Testosterone deficiency  - Testosterone  6. Gastroesophageal reflux disease  - CBC with Differential/Platelet  7. Medication management  - CBC with Differential/Platelet - COMPLETE METABOLIC PANEL WITH GFR - Magnesium - Lipid panel - TSH - Hemoglobin A1c - Insulin, random - VITAMIN D 25 Hydroxyl - Testosterone      Discussed  regular exercise, BP monitoring, weight control to achieve/maintain BMI less than 25 and discussed med and SE's. Recommended labs to assess and monitor clinical status with further disposition pending results of labs. Over 30 minutes of exam, counseling,  chart review was performed.

## 2017-09-21 LAB — COMPLETE METABOLIC PANEL WITH GFR
AG Ratio: 1.7 (calc) (ref 1.0–2.5)
ALT: 31 U/L (ref 9–46)
AST: 23 U/L (ref 10–35)
Albumin: 4.4 g/dL (ref 3.6–5.1)
Alkaline phosphatase (APISO): 48 U/L (ref 40–115)
BILIRUBIN TOTAL: 0.6 mg/dL (ref 0.2–1.2)
BUN: 11 mg/dL (ref 7–25)
CALCIUM: 9.6 mg/dL (ref 8.6–10.3)
CHLORIDE: 104 mmol/L (ref 98–110)
CO2: 27 mmol/L (ref 20–32)
Creat: 1.15 mg/dL (ref 0.70–1.33)
GFR, EST AFRICAN AMERICAN: 82 mL/min/{1.73_m2} (ref >=60)
GFR, EST NON AFRICAN AMERICAN: 71 mL/min/{1.73_m2} (ref >=60)
GLUCOSE: 94 mg/dL (ref 65–99)
Globulin: 2.6 g/dL (calc) (ref 1.9–3.7)
Potassium: 4.1 mmol/L (ref 3.5–5.3)
Sodium: 140 mmol/L (ref 135–146)
TOTAL PROTEIN: 7 g/dL (ref 6.1–8.1)

## 2017-09-21 LAB — CBC WITH DIFFERENTIAL/PLATELET
BASOS PCT: 1.2 %
Basophils Absolute: 68 cells/uL (ref 0–200)
Eosinophils Absolute: 160 cells/uL (ref 15–500)
Eosinophils Relative: 2.8 %
HCT: 49.7 % (ref 38.5–50.0)
Hemoglobin: 16.7 g/dL (ref 13.2–17.1)
Lymphs Abs: 1026 cells/uL (ref 850–3900)
MCH: 26.9 pg — ABNORMAL LOW (ref 27.0–33.0)
MCHC: 33.6 g/dL (ref 32.0–36.0)
MCV: 80 fL (ref 80.0–100.0)
MONOS PCT: 6.7 %
MPV: 9.5 fL (ref 7.5–12.5)
Neutro Abs: 4064 cells/uL (ref 1500–7800)
Neutrophils Relative %: 71.3 %
PLATELETS: 308 10*3/uL (ref 140–400)
RBC: 6.21 10*6/uL — AB (ref 4.20–5.80)
RDW: 14.5 % (ref 11.0–15.0)
TOTAL LYMPHOCYTE: 18 %
WBC mixed population: 382 cells/uL (ref 200–950)
WBC: 5.7 10*3/uL (ref 3.8–10.8)

## 2017-09-21 LAB — HEMOGLOBIN A1C
HEMOGLOBIN A1C: 5.2 %{Hb} (ref <5.7)
MEAN PLASMA GLUCOSE: 103 (calc)
eAG (mmol/L): 5.7 (calc)

## 2017-09-21 LAB — INSULIN, RANDOM: Insulin: 36.3 u[IU]/mL — ABNORMAL HIGH (ref 2.0–19.6)

## 2017-09-21 LAB — VITAMIN D 25 HYDROXY (VIT D DEFICIENCY, FRACTURES): Vit D, 25-Hydroxy: 95 ng/mL (ref 30–100)

## 2017-09-21 LAB — TESTOSTERONE: TESTOSTERONE: 673 ng/dL (ref 250–827)

## 2017-09-21 LAB — LIPID PANEL
Cholesterol: 176 mg/dL (ref <200)
HDL: 36 mg/dL — AB (ref >40)
LDL Cholesterol (Calc): 114 mg/dL (calc) — ABNORMAL HIGH
NON-HDL CHOLESTEROL (CALC): 140 mg/dL — AB (ref <130)
TRIGLYCERIDES: 148 mg/dL (ref <150)
Total CHOL/HDL Ratio: 4.9 (calc) (ref <5.0)

## 2017-09-21 LAB — TSH: TSH: 2.55 mIU/L (ref 0.40–4.50)

## 2017-09-21 LAB — MAGNESIUM: Magnesium: 2 mg/dL (ref 1.5–2.5)

## 2017-09-23 ENCOUNTER — Encounter: Payer: Self-pay | Admitting: Internal Medicine

## 2017-09-26 ENCOUNTER — Other Ambulatory Visit: Payer: Self-pay | Admitting: Internal Medicine

## 2017-09-26 ENCOUNTER — Encounter: Payer: Self-pay | Admitting: Internal Medicine

## 2017-09-26 MED ORDER — EZETIMIBE 10 MG PO TABS: 10.0000 mg | ORAL_TABLET | Freq: Every day | ORAL | 1 refills | Status: DC

## 2017-10-09 ENCOUNTER — Other Ambulatory Visit: Payer: Self-pay | Admitting: Physician Assistant

## 2017-11-14 ENCOUNTER — Other Ambulatory Visit: Payer: Self-pay | Admitting: Internal Medicine

## 2017-11-27 ENCOUNTER — Other Ambulatory Visit: Payer: Self-pay | Admitting: Internal Medicine

## 2017-12-31 NOTE — Progress Notes (Signed)
FOLLOW UP  Assessment and Plan:   Hypertension mildly elevated today but typically at goal - defer adjusting until next visit Monitor blood pressure at home; patient to call if consistently greater than 130/80 Continue DASH diet.   Reminder to go to the ER if any CP, SOB, nausea, dizziness, severe HA, changes vision/speech, left arm numbness and tingling and jaw pain.  Cholesterol Currently at LDL goal; continue rosuvastatin 5 mg daily - cannot tolerate higher doses or zetia, diet emphasized Continue low cholesterol diet and exercise.  Check lipid panel.   Other abnormal glucose Recent A1Cs at goal Continue diet and exercise.  Perform daily foot/skin check, notify office of any concerning changes.  Defer A1C; check CMP  Overweight Long discussion about weight loss, diet, and exercise Recommended diet heavy in fruits and veggies and low in animal meats, cheeses, and dairy products, appropriate calorie intake Discussed ideal weight for height (165-170 lb) and initial weight goal (175lb) Patient will work on walking more, decreasing sugar/pasta Will follow up in 3 months  Vitamin D Def At goal at last visit; continue supplementation to maintain goal of 70-100 Defer Vit D level  Hypogonadism - continue to monitor, states medication is helping with symptoms of low T.   Continue diet and meds as discussed. Further disposition pending results of labs. Discussed med's effects and SE's.   Over 30 minutes of exam, counseling, chart review, and critical decision making was performed.   Future Appointments  Date Time Provider [place]  04/13/2018 10:00 AM [name], MD [facility] None    ----------------------------------------------------------------------------------------------------------------------  HPI 57 y.o. male  presents for 3 month follow up on hypertension, cholesterol, glucose management, weight and vitamin D deficiency.   BMI is Body mass index is  27.76 kg/m., he has not been working on diet and exercise particularly, has been too busy with work and due to heat. He avoids fast food Wt Readings from Last 3 Encounters:  01/03/18 188 lb (85.3 kg)  09/20/17 183 lb 9.6 oz (83.3 kg)  06/14/17 186 lb (84.4 kg)   His blood pressure has been controlled at home, today their BP is BP: 134/90  He does workout. He denies chest pain, shortness of breath, dizziness.   He is on cholesterol medication (crestor 5 mg daily) and denies myalgias. His LDL cholesterol is not at goal; trigs remain elevated. The cholesterol last visit was:   Lab Results  Component Value Date   CHOL 176 09/20/2017   HDL 36 (L) 09/20/2017   LDLCALC 114 (H) 09/20/2017   TRIG 148 09/20/2017   CHOLHDL 4.9 09/20/2017    He has been working on diet and exercise for glucose management for hx of elevated A1C/insulin resistance, and denies foot ulcerations, increased appetite, nausea, paresthesia of the feet, polydipsia, polyuria, visual disturbances, vomiting and weight loss. Last A1C in the office was:  Lab Results  Component Value Date   HGBA1C 5.2 09/20/2017   Patient is on Vitamin D supplement and at goal:   Lab Results  Component Value Date   VD25OH 95 09/20/2017     He has a history of testosterone deficiency and is on testosterone replacement, last injection wa 12/2013. He states that the testosterone helps with his energy, libido, muscle mass. Lab Results  Component Value Date   TESTOSTERONE 673 09/20/2017      Current Medications:  Current Outpatient Medications on File Prior to Visit  Medication Sig  . butalbital-acetaminophen-caffeine (FIORICET, ESGIC) 50-325-40 MG tablet TAKE 1 TO  2 TABLETS 4 TIMES A DAY ONLY IF NEEDED FOR SEVERE HEADACHE  . ezetimibe (ZETIA) 10 MG tablet Take 1 tablet (10 mg total) by mouth daily.  . fluticasone (FLONASE) 50 MCG/ACT nasal spray Place into both nostrils as needed.   Marland Kitchen KRILL OIL PO Take by mouth daily. Reported on 07/15/2015   . lisinopril (PRINIVIL,ZESTRIL) 20 MG tablet TAKE 1 TABLET BY MOUTH  DAILY  . OMEPRAZOLE PO Take 20 mg by mouth daily. Reported on 07/15/2015  . OVER THE COUNTER MEDICATION Zyrtec OTC 1 tablet daily PRN.  . pregabalin (LYRICA) 75 MG capsule Take 1 capsule 2 x /day for pain (Patient taking differently: as needed. Take 1 capsule 2 x /day for pain)  . rosuvastatin (CRESTOR) 5 MG tablet Take 5 mg by mouth daily.  . SYRINGE-NEEDLE, DISP, 3 ML (B-D 3CC LUER-LOK SYR 22GX1") 22G X 1" 3 ML MISC Checks sugar 1 daily for fluctuating sugars for prediabetes  . traMADol (ULTRAM) 50 MG tablet TAKE 1 TABLET EVERY 4 HOURS AS NEEDED FOR SEVERE PAIN  . Triamcinolone Acetonide (NASACORT AQ NA) Place into the nose. Reported on 07/15/2015   No current facility-administered medications on file prior to visit.      Allergies:  Allergies  Allergen Reactions  . Zetia [Ezetimibe] Diarrhea     Medical History:  Past Medical History:  Diagnosis Date  . Allergy   . Hyperlipidemia   . Hypertension   . OSA (obstructive sleep apnea)    Family history- Reviewed and unchanged Social history- Reviewed and unchanged   Review of Systems:  Review of Systems  Constitutional: Negative for malaise/fatigue and weight loss.  HENT: Negative for hearing loss and tinnitus.   Eyes: Negative for blurred vision and double vision.  Respiratory: Negative for cough, shortness of breath and wheezing.   Cardiovascular: Negative for chest pain, palpitations, orthopnea, claudication and leg swelling.  Gastrointestinal: Negative for abdominal pain, blood in stool, constipation, diarrhea, heartburn, melena, nausea and vomiting.  Genitourinary: Negative.   Musculoskeletal: Negative for joint pain and myalgias.  Skin: Negative for rash.  Neurological: Negative for dizziness, tingling, sensory change, weakness and headaches.  Endo/Heme/Allergies: Negative for polydipsia.  Psychiatric/Behavioral: Negative.   All other systems reviewed  and are negative.    Physical Exam: BP 134/90   Pulse 83   Temp (!) 97.3 F (36.3 C)   Ht 5\' 9"  (1.753 m)   Wt 188 lb (85.3 kg)   SpO2 98%   BMI 27.76 kg/m  Wt Readings from Last 3 Encounters:  01/03/18 188 lb (85.3 kg)  09/20/17 183 lb 9.6 oz (83.3 kg)  06/14/17 186 lb (84.4 kg)   General Appearance: Well nourished, in no apparent distress. Eyes: PERRLA, EOMs, conjunctiva no swelling or erythema Sinuses: No Frontal/maxillary tenderness ENT/Mouth: Ext aud canals clear, TMs without erythema, bulging. No erythema, swelling, or exudate on post pharynx.  Tonsils not swollen or erythematous. Hearing normal.  Neck: Supple, thyroid normal.  Respiratory: Respiratory effort normal, BS equal bilaterally without rales, rhonchi, wheezing or stridor.  Cardio: RRR with no MRGs. Brisk peripheral pulses without edema.  Abdomen: Soft, + BS.  Non tender, no guarding, rebound, hernias, masses. Lymphatics: Non tender without lymphadenopathy.  Musculoskeletal: Full ROM, 5/5 strength, Normal gait Skin: Warm, dry without rashes, lesions, ecchymosis.  Neuro: Cranial nerves intact. No cerebellar symptoms.  Psych: Awake and oriented X 3, normal affect, Insight and Judgment appropriate.    Izora Ribas, NP 4:44 PM [facility]

## 2018-01-02 ENCOUNTER — Ambulatory Visit: Payer: Self-pay | Admitting: Adult Health

## 2018-01-03 ENCOUNTER — Encounter: Payer: Self-pay | Admitting: Adult Health

## 2018-01-03 ENCOUNTER — Ambulatory Visit (INDEPENDENT_AMBULATORY_CARE_PROVIDER_SITE_OTHER): Payer: 59 | Admitting: Adult Health

## 2018-01-03 VITALS — BP 134/90 | HR 83 | Temp 97.3°F | Ht 69.0 in | Wt 188.0 lb

## 2018-01-03 DIAGNOSIS — I10 Essential (primary) hypertension: Secondary | ICD-10-CM

## 2018-01-03 DIAGNOSIS — E782 Mixed hyperlipidemia: Secondary | ICD-10-CM

## 2018-01-03 DIAGNOSIS — E663 Overweight: Secondary | ICD-10-CM

## 2018-01-03 DIAGNOSIS — Z79899 Other long term (current) drug therapy: Secondary | ICD-10-CM

## 2018-01-03 DIAGNOSIS — Z23 Encounter for immunization: Secondary | ICD-10-CM | POA: Diagnosis not present

## 2018-01-03 DIAGNOSIS — E559 Vitamin D deficiency, unspecified: Secondary | ICD-10-CM | POA: Diagnosis not present

## 2018-01-03 DIAGNOSIS — E349 Endocrine disorder, unspecified: Secondary | ICD-10-CM

## 2018-01-03 DIAGNOSIS — E8881 Metabolic syndrome: Secondary | ICD-10-CM | POA: Diagnosis not present

## 2018-01-03 MED ORDER — TESTOSTERONE CYPIONATE 200 MG/ML IM SOLN: 200.0000 mg | INTRAMUSCULAR | 2 refills | Status: DC

## 2018-01-03 MED ORDER — TESTOSTERONE CYPIONATE 200 MG/ML IM SOLN: 400.0000 mg | INTRAMUSCULAR | 2 refills | Status: DC

## 2018-01-03 NOTE — Patient Instructions (Signed)
Goals    . LDL CALC < 100    . Weight (lb) < 175 lb (79.4 kg)      Know what a healthy weight is for you (roughly BMI <25) and aim to maintain this  Aim for 7+ servings of fruits and vegetables daily  65-80+ fluid ounces of water or unsweet tea for healthy kidneys  Limit to max 1 drink of alcohol per day; avoid smoking/tobacco  Limit animal fats in diet for cholesterol and heart health - choose grass fed whenever available  Avoid highly processed foods, and foods high in saturated/trans fats  Aim for low stress - take time to unwind and care for your mental health  Aim for 150 min of moderate intensity exercise weekly for heart health, and weights twice weekly for bone health  Aim for 7-9 hours of sleep daily      When it comes to diets, agreement about the perfect plan isn't easy to find, even among the experts. Experts at the [employer] developed an idea known as the Healthy Eating Plate. Just imagine a plate divided into logical, healthy portions.  The emphasis is on diet quality:  Load up on vegetables and fruits - one-half of your plate: Aim for color and variety, and remember that potatoes don't count.  Go for whole grains - one-quarter of your plate: Whole wheat, barley, wheat berries, quinoa, oats, brown rice, and foods made with them. If you want pasta, go with whole wheat pasta.  Protein power - one-quarter of your plate: Fish, chicken, beans, and nuts are all healthy, versatile protein sources. Limit red meat.  The diet, however, does go beyond the plate, offering a few other suggestions.  Use healthy plant oils, such as olive, canola, soy, corn, sunflower and peanut. Check the labels, and avoid partially hydrogenated oil, which have unhealthy trans fats.  If you're thirsty, drink water. Coffee and tea are good in moderation, but skip sugary drinks and limit milk and dairy products to one or two daily servings.  The type of carbohydrate in  the diet is more important than the amount. Some sources of carbohydrates, such as vegetables, fruits, whole grains, and beans-are healthier than others.  Finally, stay active.

## 2018-01-04 LAB — CBC WITH DIFFERENTIAL/PLATELET
BASOS ABS: 99 {cells}/uL (ref 0–200)
BASOS PCT: 1.5 %
EOS PCT: 4.1 %
Eosinophils Absolute: 271 cells/uL (ref 15–500)
HEMATOCRIT: 48.5 % (ref 38.5–50.0)
HEMOGLOBIN: 16.4 g/dL (ref 13.2–17.1)
LYMPHS ABS: 1472 {cells}/uL (ref 850–3900)
MCH: 27.6 pg (ref 27.0–33.0)
MCHC: 33.8 g/dL (ref 32.0–36.0)
MCV: 81.5 fL (ref 80.0–100.0)
MPV: 9.6 fL (ref 7.5–12.5)
Monocytes Relative: 8.3 %
NEUTROS ABS: 4211 {cells}/uL (ref 1500–7800)
Neutrophils Relative %: 63.8 %
Platelets: 300 10*3/uL (ref 140–400)
RBC: 5.95 10*6/uL — AB (ref 4.20–5.80)
RDW: 14.6 % (ref 11.0–15.0)
Total Lymphocyte: 22.3 %
WBC mixed population: 548 cells/uL (ref 200–950)
WBC: 6.6 10*3/uL (ref 3.8–10.8)

## 2018-01-04 LAB — LIPID PANEL
Cholesterol: 139 mg/dL (ref <200)
HDL: 39 mg/dL — ABNORMAL LOW (ref >40)
LDL CHOLESTEROL (CALC): 62 mg/dL
Non-HDL Cholesterol (Calc): 100 mg/dL (calc) (ref <130)
Total CHOL/HDL Ratio: 3.6 (calc) (ref <5.0)
Triglycerides: 292 mg/dL — ABNORMAL HIGH (ref <150)

## 2018-01-04 LAB — COMPLETE METABOLIC PANEL WITH GFR
AG RATIO: 1.8 (calc) (ref 1.0–2.5)
ALT: 32 U/L (ref 9–46)
AST: 25 U/L (ref 10–35)
Albumin: 4.4 g/dL (ref 3.6–5.1)
Alkaline phosphatase (APISO): 45 U/L (ref 40–115)
BILIRUBIN TOTAL: 0.4 mg/dL (ref 0.2–1.2)
BUN: 17 mg/dL (ref 7–25)
CHLORIDE: 102 mmol/L (ref 98–110)
CO2: 33 mmol/L — ABNORMAL HIGH (ref 20–32)
Calcium: 9.6 mg/dL (ref 8.6–10.3)
Creat: 1.16 mg/dL (ref 0.70–1.33)
GFR, EST AFRICAN AMERICAN: 81 mL/min/{1.73_m2} (ref >=60)
GFR, Est Non African American: 70 mL/min/{1.73_m2} (ref >=60)
GLOBULIN: 2.4 g/dL (ref 1.9–3.7)
Glucose, Bld: 81 mg/dL (ref 65–99)
POTASSIUM: 4.5 mmol/L (ref 3.5–5.3)
Sodium: 140 mmol/L (ref 135–146)
Total Protein: 6.8 g/dL (ref 6.1–8.1)

## 2018-01-04 LAB — TSH: TSH: 4.31 m[IU]/L (ref 0.40–4.50)

## 2018-01-04 LAB — MAGNESIUM: MAGNESIUM: 1.9 mg/dL (ref 1.5–2.5)

## 2018-01-28 ENCOUNTER — Other Ambulatory Visit: Payer: Self-pay | Admitting: Internal Medicine

## 2018-04-12 ENCOUNTER — Encounter: Payer: Self-pay | Admitting: Internal Medicine

## 2018-04-12 NOTE — Progress Notes (Signed)
[facility]   [name], M.D.     Uvaldo Bristle. Silverio Lay, P.A.-C Liane Comber, [place]                [address] [place], [place] [address] Telephone [phone] Telefax [phone] Annual  Screening/Preventative Visit  & Comprehensive Evaluation & Examination     This very nice 58 y.o. MWM presents for a Screening /Preventative Visit & comprehensive evaluation and management of multiple medical co-morbidities.  Patient has been followed for HTN, HLD, Prediabetes / Insulin Resistance  and Vitamin D Deficiency. Patient has GERD controlled with diet & his meds. He also has OSA on CPAP with improved Restorative Sleep and less Daytime excessive somnolence.     HTN predates circa 2007. Patient's BP has been controlled at home.  Today's BP is at goal - 124/82. Patient denies any cardiac symptoms as chest pain, palpitations, shortness of breath, dizziness or ankle swelling.     Patient's hyperlipidemia is controlled with diet and medications. Patient denies myalgias or other medication SE's. Last lipids were at goal for Chol with elevated Trig's:  Lab Results  Component Value Date   CHOL 149 04/13/2018   HDL 36 (L) 04/13/2018   LDLCALC 85 04/13/2018   TRIG 181 (H) 04/13/2018   CHOLHDL 4.1 04/13/2018      Patient has hx/o prediabetes / Insulin Resistance (A1c 5.4% / Insulin 45 / 2015)  and patient denies reactive hypoglycemic symptoms, visual blurring, diabetic polys or paresthesias. Last A1c was  Lab Results  Component Value Date   HGBA1C 5.3 04/13/2018       Patient has hx/o Low Testosterone & has been on parenteral Testosterone replacement with improved stamina and sense of well being.      Finally, patient has hisory of Vitamin D Deficiency and last vitamin D was at goal: Lab Results  Component Value Date   VD25OH 81 04/13/2018   Current Outpatient Medications on File Prior to Visit   Medication Sig  . butalbital-acetaminophen-caffeine (FIORICET, ESGIC) 50-325-40 MG tablet TAKE 1 TO 2 TABLETS 4 TIMES A DAY ONLY IF NEEDED FOR SEVERE HEADACHE  . fluticasone (FLONASE) 50 MCG/ACT nasal spray Place into both nostrils as needed.   Marland Kitchen KRILL OIL PO Take by mouth daily. Reported on 07/15/2015  . lisinopril (PRINIVIL,ZESTRIL) 20 MG tablet TAKE 1 TABLET BY MOUTH  DAILY  . OVER THE COUNTER MEDICATION Zyrtec OTC 1 tablet daily PRN.  . pregabalin (LYRICA) 75 MG capsule Take 1 capsule 2 x /day for pain (Patient taking differently: as needed. Take 1 capsule 2 x /day for pain)  . rosuvastatin (CRESTOR) 5 MG tablet TAKE 1 TABLET BY MOUTH  DAILY FOR CHOLESTEROL  . SYRINGE-NEEDLE, DISP, 3 ML (B-D 3CC LUER-LOK SYR 22GX1") 22G X 1" 3 ML MISC Checks sugar 1 daily for fluctuating sugars for prediabetes  . testosterone cypionate (DEPOTESTOSTERONE CYPIONATE) 200 MG/ML injection Inject 2 mLs (400 mg total) into the muscle every 14 (fourteen) days.  . traMADol (ULTRAM) 50 MG tablet TAKE 1 TABLET EVERY 4 HOURS AS NEEDED FOR SEVERE PAIN   No current facility-administered medications on file prior to visit.    No Known Allergies   Past Medical History:  Diagnosis Date  . Allergy   . Hyperlipidemia   . Hypertension   . OSA (obstructive sleep apnea)    Health Maintenance  Topic  Date Due  . COLONOSCOPY  03/21/2022  . TETANUS/TDAP  10/26/2023  . INFLUENZA VACCINE  Completed  . Hepatitis C Screening  Completed  . HIV Screening  Completed   Immunization History  Administered Date(s) Administered  . Influenza Inj Mdck Quad With Preservative 01/03/2018  . Influenza,inj,quad, With Preservative 01/29/2016  . Influenza-Unspecified 01/30/2017  . PPD Test 10/25/2013, 12/22/2014, 01/29/2016, 04/13/2018  . Pneumococcal Polysaccharide-23 12/22/2014  . Tdap 10/25/2013   Last Colon - Dec 2013 - Dr Watt Climes - recc 5 yr f/u - due Dec 2018  Past Surgical History:  Procedure Laterality Date  . APPENDECTOMY   1983  . HAND SURGERY Right 2007   Family History  Problem Relation Age of Onset  . Cancer Father        SCC- head and neck   Social History   Socioeconomic History  . Marital status: Married    Spouse name: Not on file  . Number of children: Not on file  . Years of education: Not on file  . Highest education level: Not on file  Occupational History  . Not on file  Tobacco Use  . Smoking status: Never Smoker  . Smokeless tobacco: Never Used  Substance and Sexual Activity  . Alcohol use: Yes    Alcohol/week: 2.0 standard drinks    Types: 2 Standard drinks or equivalent per week  . Drug use: No  . Sexual activity: Not on file    ROS Constitutional: Denies fever, chills, weight loss/gain, headaches, insomnia,  night sweats or change in appetite. Does c/o fatigue. Eyes: Denies redness, blurred vision, diplopia, discharge, itchy or watery eyes.  ENT: Denies discharge, congestion, post nasal drip, epistaxis, sore throat, earache, hearing loss, dental pain, Tinnitus, Vertigo, Sinus pain or snoring.  Cardio: Denies chest pain, palpitations, irregular heartbeat, syncope, dyspnea, diaphoresis, orthopnea, PND, claudication or edema Respiratory: denies cough, dyspnea, DOE, pleurisy, hoarseness, laryngitis or wheezing.  Gastrointestinal: Denies dysphagia, heartburn, reflux, water brash, pain, cramps, nausea, vomiting, bloating, diarrhea, constipation, hematemesis, melena, hematochezia, jaundice or hemorrhoids Genitourinary: Denies dysuria, frequency, urgency, nocturia, hesitancy, discharge, hematuria or flank pain Musculoskeletal: Denies arthralgia, myalgia, stiffness, Jt. Swelling, pain, limp or strain/sprain. Denies Falls. Skin: Denies puritis, rash, hives, warts, acne, eczema or change in skin lesion Neuro: No weakness, tremor, incoordination, spasms, paresthesia or pain Psychiatric: Denies confusion, memory loss or sensory loss. Denies Depression. Endocrine: Denies change in weight,  skin, hair change, nocturia, and paresthesia, diabetic polys, visual blurring or hyper / hypo glycemic episodes.  Heme/Lymph: No excessive bleeding, bruising or enlarged lymph nodes.  Physical Exam  BP 124/82   Pulse 84   Temp 97.7 F (36.5 C)   Resp 16   Ht 5\' 9"  (1.753 m)   Wt 188 lb (85.3 kg)   BMI 27.76 kg/m   General Appearance: Well nourished and well groomed and in no apparent distress.  Eyes: PERRLA, EOMs, conjunctiva no swelling or erythema, normal fundi and vessels. Sinuses: No frontal/maxillary tenderness ENT/Mouth: EACs patent / TMs  nl. Nares clear without erythema, swelling, mucoid exudates. Oral hygiene is good. No erythema, swelling, or exudate. Tongue normal, non-obstructing. Tonsils not swollen or erythematous. Hearing normal.  Neck: Supple, thyroid not palpable. No bruits, nodes or JVD. Respiratory: Respiratory effort normal.  BS equal and clear bilateral without rales, rhonci, wheezing or stridor. Cardio: Heart sounds are normal with regular rate and rhythm and no murmurs, rubs or gallops. Peripheral pulses are normal and equal bilaterally without edema. No aortic or femoral bruits. Chest:  symmetric with normal excursions and percussion.  Abdomen: Soft, with Nl bowel sounds. Nontender, no guarding, rebound, hernias, masses, or organomegaly.  Lymphatics: Non tender without lymphadenopathy.  Genitourinary: No hernias.Testes nl. DRE - prostate nl for age - smooth & firm w/o nodules. Musculoskeletal: Full ROM all peripheral extremities, joint stability, 5/5 strength, and normal gait. Skin: Warm and dry without rashes, lesions, cyanosis, clubbing or  ecchymosis.  Neuro: Cranial nerves intact, reflexes equal bilaterally. Normal muscle tone, no cerebellar symptoms. Sensation intact.  Pysch: Alert and oriented X 3 with normal affect, insight and judgment appropriate.   Assessment and Plan  1. Annual Preventative/Screening Exam   2. Essential hypertension  - EKG  12-Lead - [place], RETROPERITNL ABD,  LTD - Microalbumin / creatinine urine ratio - CBC with Differential/Platelet - COMPLETE METABOLIC PANEL WITH GFR - Magnesium - TSH  3. Hyperlipidemia, mixed  - EKG 12-Lead - [place], RETROPERITNL ABD,  LTD - Lipid panel - TSH  4. Insulin resistance / Prediabetes  - Hemoglobin A1c - Insulin, random  5. Vitamin D deficiency  - VITAMIN D 25 Hydroxyl  6. Testosterone deficiency  - Testosterone  7. Gastroesophageal reflux disease   8. Screening for colorectal cancer  - POC Hemoccult Bld/Stl  9. Benign localized prostatic hyperplasia with lower urinary tract symptoms (LUTS)  - PSA  10. Prostate cancer screening  - PSA  11. Screening for ischemic heart disease  - EKG 12-Lead  12. FH: hypertension  - EKG 12-Lead - [place], RETROPERITNL ABD,  LTD  13. Screening for AAA (aortic abdominal aneurysm)  - [place], RETROPERITNL ABD,  LTD  14. Screening examination for pulmonary tuberculosis  - TB Skin Test  15. Fatigue, unspecified type  - Iron,Total/Total Iron Binding Cap - Vitamin B12 - Testosterone - CBC with Differential/Platelet - TSH  16. Medication management  - Urinalysis, Routine w reflex microscopic - Microalbumin / creatinine urine ratio - Testosterone - CBC with Differential/Platelet - COMPLETE METABOLIC PANEL WITH GFR - Magnesium - Lipid panel - TSH - Hemoglobin A1c - Insulin, random - VITAMIN D 25 Hydroxyl        Patient was counseled in prudent diet, weight control to achieve/maintain BMI less than 25, BP monitoring, regular exercise and medications as discussed.  Discussed med effects and SE's. Routine screening labs and tests as requested with regular follow-up as recommended. Over 40 minutes of exam, counseling, chart review and high complex critical decision making was performed

## 2018-04-12 NOTE — Patient Instructions (Signed)

## 2018-04-13 ENCOUNTER — Ambulatory Visit: Payer: 59 | Admitting: Internal Medicine

## 2018-04-13 VITALS — BP 124/82 | HR 84 | Temp 97.7°F | Resp 16 | Ht 69.0 in | Wt 188.0 lb

## 2018-04-13 DIAGNOSIS — Z79899 Other long term (current) drug therapy: Secondary | ICD-10-CM

## 2018-04-13 DIAGNOSIS — F988 Other specified behavioral and emotional disorders with onset usually occurring in childhood and adolescence: Secondary | ICD-10-CM

## 2018-04-13 DIAGNOSIS — Z8249 Family history of ischemic heart disease and other diseases of the circulatory system: Secondary | ICD-10-CM

## 2018-04-13 DIAGNOSIS — N401 Enlarged prostate with lower urinary tract symptoms: Secondary | ICD-10-CM | POA: Diagnosis not present

## 2018-04-13 DIAGNOSIS — Z0000 Encounter for general adult medical examination without abnormal findings: Secondary | ICD-10-CM | POA: Diagnosis not present

## 2018-04-13 DIAGNOSIS — I10 Essential (primary) hypertension: Secondary | ICD-10-CM | POA: Diagnosis not present

## 2018-04-13 DIAGNOSIS — Z1212 Encounter for screening for malignant neoplasm of rectum: Secondary | ICD-10-CM

## 2018-04-13 DIAGNOSIS — E8881 Metabolic syndrome: Secondary | ICD-10-CM

## 2018-04-13 DIAGNOSIS — Z136 Encounter for screening for cardiovascular disorders: Secondary | ICD-10-CM

## 2018-04-13 DIAGNOSIS — K219 Gastro-esophageal reflux disease without esophagitis: Secondary | ICD-10-CM

## 2018-04-13 DIAGNOSIS — Z111 Encounter for screening for respiratory tuberculosis: Secondary | ICD-10-CM

## 2018-04-13 DIAGNOSIS — R5383 Other fatigue: Secondary | ICD-10-CM

## 2018-04-13 DIAGNOSIS — E349 Endocrine disorder, unspecified: Secondary | ICD-10-CM

## 2018-04-13 DIAGNOSIS — D126 Benign neoplasm of colon, unspecified: Secondary | ICD-10-CM

## 2018-04-13 DIAGNOSIS — E88819 Insulin resistance, unspecified: Secondary | ICD-10-CM

## 2018-04-13 DIAGNOSIS — Z125 Encounter for screening for malignant neoplasm of prostate: Secondary | ICD-10-CM

## 2018-04-13 DIAGNOSIS — E782 Mixed hyperlipidemia: Secondary | ICD-10-CM

## 2018-04-13 DIAGNOSIS — E559 Vitamin D deficiency, unspecified: Secondary | ICD-10-CM

## 2018-04-13 DIAGNOSIS — Z1211 Encounter for screening for malignant neoplasm of colon: Secondary | ICD-10-CM

## 2018-04-13 DIAGNOSIS — Z0001 Encounter for general adult medical examination with abnormal findings: Secondary | ICD-10-CM

## 2018-04-13 MED ORDER — OMEPRAZOLE 40 MG PO CPDR: DELAYED_RELEASE_CAPSULE | ORAL | 3 refills | Status: DC

## 2018-04-13 MED ORDER — AMPHETAMINE-DEXTROAMPHETAMINE 20 MG PO TABS: ORAL_TABLET | ORAL | 0 refills | Status: DC

## 2018-04-16 LAB — IRON, TOTAL/TOTAL IRON BINDING CAP
%SAT: 20 % (ref 20–48)
Iron: 72 ug/dL (ref 50–180)
TIBC: 363 ug/dL (ref 250–425)

## 2018-04-16 LAB — LIPID PANEL
Cholesterol: 149 mg/dL (ref <200)
HDL: 36 mg/dL — ABNORMAL LOW (ref >40)
LDL Cholesterol (Calc): 85 mg/dL (calc)
Non-HDL Cholesterol (Calc): 113 mg/dL (calc) (ref <130)
Total CHOL/HDL Ratio: 4.1 (calc) (ref <5.0)
Triglycerides: 181 mg/dL — ABNORMAL HIGH (ref <150)

## 2018-04-16 LAB — COMPLETE METABOLIC PANEL WITH GFR
AG Ratio: 1.9 (calc) (ref 1.0–2.5)
ALT: 41 U/L (ref 9–46)
AST: 29 U/L (ref 10–35)
Albumin: 4.6 g/dL (ref 3.6–5.1)
Alkaline phosphatase (APISO): 52 U/L (ref 40–115)
BUN: 14 mg/dL (ref 7–25)
CO2: 27 mmol/L (ref 20–32)
Calcium: 9.7 mg/dL (ref 8.6–10.3)
Chloride: 103 mmol/L (ref 98–110)
Creat: 1.12 mg/dL (ref 0.70–1.33)
GFR, Est African American: 84 mL/min/{1.73_m2} (ref >=60)
GFR, Est Non African American: 73 mL/min/{1.73_m2} (ref >=60)
GLOBULIN: 2.4 g/dL (ref 1.9–3.7)
Glucose, Bld: 84 mg/dL (ref 65–99)
Potassium: 4.2 mmol/L (ref 3.5–5.3)
Sodium: 139 mmol/L (ref 135–146)
Total Bilirubin: 0.7 mg/dL (ref 0.2–1.2)
Total Protein: 7 g/dL (ref 6.1–8.1)

## 2018-04-16 LAB — TB SKIN TEST
Induration: 0 mm
TB Skin Test: NEGATIVE

## 2018-04-16 LAB — VITAMIN B12: Vitamin B-12: 876 pg/mL (ref 200–1100)

## 2018-04-16 LAB — MICROALBUMIN / CREATININE URINE RATIO
Creatinine, Urine: 57 mg/dL (ref 20–320)
Microalb Creat Ratio: 5 mcg/mg creat (ref <30)
Microalb, Ur: 0.3 mg/dL

## 2018-04-16 LAB — CBC WITH DIFFERENTIAL/PLATELET
Absolute Monocytes: 462 cells/uL (ref 200–950)
Basophils Absolute: 68 cells/uL (ref 0–200)
Basophils Relative: 1.2 %
EOS PCT: 5.5 %
Eosinophils Absolute: 314 cells/uL (ref 15–500)
HCT: 53.7 % — ABNORMAL HIGH (ref 38.5–50.0)
Hemoglobin: 18 g/dL — ABNORMAL HIGH (ref 13.2–17.1)
Lymphs Abs: 1197 cells/uL (ref 850–3900)
MCH: 27.6 pg (ref 27.0–33.0)
MCHC: 33.5 g/dL (ref 32.0–36.0)
MCV: 82.5 fL (ref 80.0–100.0)
MPV: 9.9 fL (ref 7.5–12.5)
Monocytes Relative: 8.1 %
Neutro Abs: 3659 cells/uL (ref 1500–7800)
Neutrophils Relative %: 64.2 %
Platelets: 291 10*3/uL (ref 140–400)
RBC: 6.51 10*6/uL — ABNORMAL HIGH (ref 4.20–5.80)
RDW: 14.9 % (ref 11.0–15.0)
Total Lymphocyte: 21 %
WBC: 5.7 10*3/uL (ref 3.8–10.8)

## 2018-04-16 LAB — URINALYSIS, ROUTINE W REFLEX MICROSCOPIC
BILIRUBIN URINE: NEGATIVE
Glucose, UA: NEGATIVE
Hgb urine dipstick: NEGATIVE
Ketones, ur: NEGATIVE
Leukocytes, UA: NEGATIVE
Nitrite: NEGATIVE
Protein, ur: NEGATIVE
SPECIFIC GRAVITY, URINE: 1.011 (ref 1.001–1.03)
pH: 7 (ref 5.0–8.0)

## 2018-04-16 LAB — PSA: PSA: 2.8 ng/mL (ref <=4.0)

## 2018-04-16 LAB — HEMOGLOBIN A1C
Hgb A1c MFr Bld: 5.3 % of total Hgb (ref <5.7)
MEAN PLASMA GLUCOSE: 105 (calc)
eAG (mmol/L): 5.8 (calc)

## 2018-04-16 LAB — TSH: TSH: 4.96 mIU/L — ABNORMAL HIGH (ref 0.40–4.50)

## 2018-04-16 LAB — INSULIN, RANDOM: Insulin: 30.1 u[IU]/mL — ABNORMAL HIGH (ref 2.0–19.6)

## 2018-04-16 LAB — VITAMIN D 25 HYDROXY (VIT D DEFICIENCY, FRACTURES): Vit D, 25-Hydroxy: 81 ng/mL (ref 30–100)

## 2018-04-16 LAB — TESTOSTERONE: TESTOSTERONE: 396 ng/dL (ref 250–827)

## 2018-04-16 LAB — MAGNESIUM: MAGNESIUM: 1.8 mg/dL (ref 1.5–2.5)

## 2018-05-07 DIAGNOSIS — H4720 Unspecified optic atrophy: Secondary | ICD-10-CM | POA: Diagnosis not present

## 2018-05-28 ENCOUNTER — Other Ambulatory Visit: Payer: Self-pay | Admitting: Internal Medicine

## 2018-05-28 ENCOUNTER — Other Ambulatory Visit: Payer: Self-pay

## 2018-05-28 DIAGNOSIS — F988 Other specified behavioral and emotional disorders with onset usually occurring in childhood and adolescence: Secondary | ICD-10-CM

## 2018-05-28 MED ORDER — AMPHETAMINE-DEXTROAMPHETAMINE 20 MG PO TABS: ORAL_TABLET | ORAL | 0 refills | Status: DC

## 2018-06-12 ENCOUNTER — Other Ambulatory Visit: Payer: Self-pay

## 2018-06-12 MED ORDER — TRAMADOL HCL 50 MG PO TABS: ORAL_TABLET | ORAL | 0 refills | Status: DC

## 2018-06-12 MED ORDER — BUTALBITAL-APAP-CAFFEINE 50-325-40 MG PO TABS: ORAL_TABLET | ORAL | 0 refills | Status: DC

## 2018-07-16 NOTE — Progress Notes (Signed)
Virtual Visit via Video Note  I connected with Oscar Moore on 07/18/18 at  8:45 AM EDT by a video enabled telemedicine application and verified that I am speaking with the correct person using two identifiers.   I discussed the limitations of evaluation and management by telemedicine and the availability of in person appointments. The patient expressed understanding and agreed to proceed.    3 Month Follow Up   Assessment and Plan:    Oscar Moore was seen today for follow-up.  Diagnoses and all orders for this visit:  Essential hypertension Well controlled, continue with benefit Taking Lisinopril 20mg  daily Monitor blood pressure at home; patient to call if consistently greater than 140/90 Continue DASH diet.   Reminder to go to the ER if any CP, SOB, nausea, dizziness, severe HA, changes vision/speech, left arm numbness and tingling and jaw pain.  OSA (obstructive sleep apnea)  Patient states using the CPAP and feeling better since starting therapy.   The plan is to continue the CPAP therapy since the patient is using and benefiting from the therapy. Discussed Mask hygiene, patient cleans every other day or daily if needed.  DDD (degenerative disc disease), lumbar Doing well on current regiment Using Lyrica 75mg  PRN, has been several weeks since last use   Rotator cuff disorder, right  Doing well at this time Has Tramadol 50mg   PRN, last use one month ago Monitoring symptoms  Mixed hyperlipidemia Doing well on current regiment, continue with benefit  Continue Rosuvastatin 5mg  nightly   Intractable chronic migraine without aura and without status migrainosus  Doing well on current regiment Continue Fioricet PRN, last use 2 weeks ago.  Testosterone deficiency Doing injections every 2 weeks.  Doing well Will check testosterone in 3 months.  Vitamin D deficiency Continue supplementation Will continue to monitor  Attention deficit disorder (ADD) without  hyperactivity Doing well on current regiment Using Adderall 20mg  PRN Uses when he has lots of appointments/meetings  Overweight (BMI 25.0-29.9) Discussed dietary and exercise modifications   Medication management  Discussed all medication, all questions answered No concerns at this time No re-fills needed at this time   Follow Up Instructions:    I discussed the assessment and treatment plan with the patient. The patient was provided an opportunity to ask questions and all were answered. The patient agreed with the plan and demonstrated an understanding of the instructions.   Follow up in 3 months  Call or return with new or worsening symptoms as discussed in appointment.  May contact office via phone [phone] or Pump Back.   Future Appointments  Date Time Provider [place]  10/25/2018  9:30 AM [name], MD [facility] None  05/02/2019 10:00 AM [name], MD [facility] None    ----------------------------------------------------------------------------------------------------------------------  HPI 58 y.o. male  presents for 3 month follow up on HTN, HLD, GERD, ADD, testosterone deficiency, migraines, history of abnormal glucose, OSA, weight and vitamin D deficiency. Reports he is doing well since last visit and there have been no changes to his health.  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  He reports he has been at home and only showing homes that are to go under contract related to [place].  Reports he & his wife have supplies that they need and are able to get to the grocery or pharmacy if necessary.  BMI is Body mass index is 25.81 kg/m., he has not been working on diet and exercise.  He is down 14lbs from last visit 3  months ago. Wt Readings from Last 3 Encounters:  07/17/18 174 lb 12.8 oz (79.3 kg)  04/13/18 188 lb (85.3 kg)  01/03/18 188 lb (85.3 kg)    HTN predates 2007. His blood pressure has been controlled at home, today their  BP is BP: 122/86  He does not workout. He denies any cardiac symptoms, chest pains, palpitations, shortness of breath, dizziness or lower extremity edema.     He is on cholesterol medication Rosuvastatin and denies myalgias. His cholesterol is not at goal. The cholesterol last visit was:   Lab Results  Component Value Date   CHOL 149 04/13/2018   HDL 36 (L) 04/13/2018   LDLCALC 85 04/13/2018   TRIG 181 (H) 04/13/2018   CHOLHDL 4.1 04/13/2018    He has not been working on diet and exercise for prediabetes, and denies hyperglycemia, hypoglycemia , increased appetite, nausea, paresthesia of the feet, polydipsia, polyuria, visual disturbances and vomiting. Last A1C in the office was:  Lab Results  Component Value Date   HGBA1C 5.3 04/13/2018   Patient is on Vitamin D supplement.   Lab Results  Component Value Date   VD25OH 81 04/13/2018       Current Medications:  Current Outpatient Medications on File Prior to Visit  Medication Sig  . amphetamine-dextroamphetamine (ADDERALL) 20 MG tablet Take 1/2 to 1 tablet 1 or 2 x /day for Focus & Concentration  . butalbital-acetaminophen-caffeine (FIORICET, ESGIC) 50-325-40 MG tablet TAKE 1 TO 2 TABLETS 4 TIMES A DAY ONLY IF NEEDED FOR SEVERE HEADACHE  . fluticasone (FLONASE) 50 MCG/ACT nasal spray Place into both nostrils as needed.   Marland Kitchen KRILL OIL PO Take by mouth daily. Reported on 07/15/2015  . omeprazole (PRILOSEC) 40 MG capsule Take 1 capsule daily at Suppertime  for Acid Reflux  . OVER THE COUNTER MEDICATION Zyrtec OTC 1 tablet daily PRN.  . pregabalin (LYRICA) 75 MG capsule Take 1 capsule 2 x /day for pain (Patient taking differently: as needed. Take 1 capsule 2 x /day for pain)  . SYRINGE-NEEDLE, DISP, 3 ML (B-D 3CC LUER-LOK SYR 22GX1") 22G X 1" 3 ML MISC Checks sugar 1 daily for fluctuating sugars for prediabetes  . testosterone cypionate (DEPOTESTOSTERONE CYPIONATE) 200 MG/ML injection Inject 2 mLs (400 mg total) into the muscle every 14  (fourteen) days.  . traMADol (ULTRAM) 50 MG tablet Take 1 tablet every 4 hours only if needed for severe pain   No current facility-administered medications on file prior to visit.     Allergies:  No Known Allergies   Medical History:  Past Medical History:  Diagnosis Date  . Allergy   . Hyperlipidemia   . Hypertension   . OSA (obstructive sleep apnea)     Family history- Reviewed and unchanged   Social history- Reviewed and unchanged   Names of Other Physician/Practitioners you currently use: 1. [facility] here for primary care 2. Eye Exam, Due for 2020 3. Dentist, Due for 2020  Patient Care Team: [name], MD as PCP - General (Internal Medicine)   Screening Tests: Immunization History  Administered Date(s) Administered  . Influenza Inj Mdck Quad With Preservative 01/03/2018  . Influenza,inj,quad, With Preservative 01/29/2016  . Influenza-Unspecified 01/30/2017  . PPD Test 10/25/2013, 12/22/2014, 01/29/2016, 04/13/2018  . Pneumococcal Polysaccharide-23 12/22/2014  . Tdap 10/25/2013     Vaccinations: TD or Tdap: 2015  Influenza: 2019 Pneumococcal: 2016 Prevnar13: N/A Shingles/Zostavax: Discussed with patient, local pharmacy   Preventative Care: Last colonoscopy: 2013  Imaging: Chest X-ray:04/2016 EKG: 04/2018   Review of Systems:  Review of Systems  Constitutional: Negative for chills, diaphoresis, fever, malaise/fatigue and weight loss.  HENT: Negative for congestion, ear discharge, ear pain, hearing loss, nosebleeds, sinus pain, sore throat and tinnitus.   Eyes: Negative for blurred vision, double vision, photophobia, pain, discharge and redness.  Respiratory: Negative for cough, hemoptysis, sputum production, shortness of breath, wheezing and stridor.   Cardiovascular: Negative for chest pain, palpitations, orthopnea, claudication, leg swelling and PND.  Gastrointestinal: Negative for abdominal pain,  blood in stool, constipation, diarrhea, heartburn, melena, nausea and vomiting.  Genitourinary: Negative for dysuria, flank pain, frequency, hematuria and urgency.  Musculoskeletal: Positive for back pain and joint pain. Negative for falls, myalgias and neck pain.       Right shoulder  Skin: Negative for itching and rash.  Neurological: Negative for dizziness, tingling, tremors, sensory change, speech change, focal weakness, seizures, loss of consciousness, weakness and headaches.  Endo/Heme/Allergies: Positive for environmental allergies. Negative for polydipsia. Does not bruise/bleed easily.  Psychiatric/Behavioral: Negative for depression, hallucinations, memory loss, substance abuse and suicidal ideas. The patient is not nervous/anxious and does not have insomnia.      Physical Exam: BP 122/86   Pulse 76   Temp (!) 97.3 F (36.3 C)   Wt 174 lb 12.8 oz (79.3 kg)   BMI 25.81 kg/m  Wt Readings from Last 3 Encounters:  07/17/18 174 lb 12.8 oz (79.3 kg)  04/13/18 188 lb (85.3 kg)  01/03/18 188 lb (85.3 kg)   General Appearance: Well nourished, in no apparent distress. Eyes: EOMs, conjunctiva no swelling or erythema ENT: Hearing normal. Speech is fluent Neck: Supple Respiratory: Respiratory effort normal, without audible rales, rhonchi, wheezing or stridor.  Cardio: BUE &BLE without edema.  Psych: Awake and oriented X 3, normal affect, Insight and Judgment appropriate.    I provided 30 minutes of non-face-to-face time during this encounter, counseling, chart review, and critical decision making was performed.   Garnet Sierras, NP [facility] 07/17/2018  8:45am

## 2018-07-17 ENCOUNTER — Ambulatory Visit: Payer: 59 | Admitting: Adult Health Nurse Practitioner

## 2018-07-17 ENCOUNTER — Ambulatory Visit: Payer: Self-pay | Admitting: Adult Health

## 2018-07-17 ENCOUNTER — Other Ambulatory Visit: Payer: Self-pay | Admitting: Internal Medicine

## 2018-07-17 ENCOUNTER — Other Ambulatory Visit: Payer: Self-pay

## 2018-07-17 ENCOUNTER — Encounter: Payer: Self-pay | Admitting: Adult Health Nurse Practitioner

## 2018-07-17 VITALS — BP 122/86 | HR 76 | Temp 97.3°F | Wt 174.8 lb

## 2018-07-17 DIAGNOSIS — F988 Other specified behavioral and emotional disorders with onset usually occurring in childhood and adolescence: Secondary | ICD-10-CM

## 2018-07-17 DIAGNOSIS — E559 Vitamin D deficiency, unspecified: Secondary | ICD-10-CM

## 2018-07-17 DIAGNOSIS — I10 Essential (primary) hypertension: Secondary | ICD-10-CM | POA: Diagnosis not present

## 2018-07-17 DIAGNOSIS — G4733 Obstructive sleep apnea (adult) (pediatric): Secondary | ICD-10-CM

## 2018-07-17 DIAGNOSIS — E349 Endocrine disorder, unspecified: Secondary | ICD-10-CM

## 2018-07-17 DIAGNOSIS — G43719 Chronic migraine without aura, intractable, without status migrainosus: Secondary | ICD-10-CM

## 2018-07-17 DIAGNOSIS — M5136 Other intervertebral disc degeneration, lumbar region: Secondary | ICD-10-CM | POA: Diagnosis not present

## 2018-07-17 DIAGNOSIS — E782 Mixed hyperlipidemia: Secondary | ICD-10-CM

## 2018-07-17 DIAGNOSIS — E663 Overweight: Secondary | ICD-10-CM

## 2018-07-17 DIAGNOSIS — M67911 Unspecified disorder of synovium and tendon, right shoulder: Secondary | ICD-10-CM | POA: Diagnosis not present

## 2018-07-17 DIAGNOSIS — Z79899 Other long term (current) drug therapy: Secondary | ICD-10-CM

## 2018-07-17 DIAGNOSIS — M51369 Other intervertebral disc degeneration, lumbar region without mention of lumbar back pain or lower extremity pain: Secondary | ICD-10-CM

## 2018-07-18 ENCOUNTER — Encounter: Payer: Self-pay | Admitting: Adult Health Nurse Practitioner

## 2018-07-18 NOTE — Patient Instructions (Signed)
Coronavirus (COVID-19) Are you at risk?  Are you at risk for the Coronavirus (COVID-19)?  To be considered HIGH RISK for Coronavirus (COVID-19), you have to meet the following criteria:  . Traveled to [place], [place], [place], [place] or [place]; or in the [place] to [place], [place], [place]  . or [place]; and have fever, cough, and shortness of breath within the last 2 weeks of travel OR . Been in close contact with a person diagnosed with COVID-19 within the last 2 weeks and have  . fever, cough,and shortness of breath .  . IF YOU DO NOT MEET THESE CRITERIA, YOU ARE CONSIDERED LOW RISK FOR COVID-19.  What to do if you are HIGH RISK for COVID-19?  . If you are having a medical emergency, call 911. . Seek medical care right away. Before you go to a doctor's office, urgent care or emergency department, .  call ahead and tell them about your recent travel, contact with someone diagnosed with COVID-19  .  and your symptoms.  . You should receive instructions from your physician's office regarding next steps of care.  . When you arrive at healthcare provider, tell the healthcare staff immediately you have returned from  . visiting [place], [place], [place], [place] or [place]; or traveled in the [place] to [place], [place],  . [place] or [place] in the last two weeks or you have been in close contact with a person diagnosed with  . COVID-19 in the last 2 weeks.   . Tell the health care staff about your symptoms: fever, cough and shortness of breath. . After you have been seen by a medical provider, you will be either: o Tested for (COVID-19) and discharged home on quarantine except to seek medical care if  o symptoms worsen, and asked to  - Stay home and avoid contact with others until you get your results (4-5 days)  - Avoid travel on public transportation if possible (such as bus, train, or airplane) or o Sent to the Emergency Department by EMS for evaluation,  COVID-19 testing  and  o possible admission depending on your condition and test results.  What to do if you are LOW RISK for COVID-19?  Reduce your risk of any infection by using the same precautions used for avoiding the common cold or flu:  . Wash your hands often with soap and warm water for at least 20 seconds.  If soap and water are not readily available,  . use an alcohol-based hand sanitizer with at least 60% alcohol.  . If coughing or sneezing, cover your mouth and nose by coughing or sneezing into the elbow areas of your shirt or coat, .  into a tissue or into your sleeve (not your hands). . Avoid shaking hands with others and consider head nods or verbal greetings only. . Avoid touching your eyes, nose, or mouth with unwashed hands.  . Avoid close contact with people who are sick. . Avoid places or events with large numbers of people in one location, like concerts or sporting events. . Carefully consider travel plans you have or are making. . If you are planning any travel outside or inside the [place], visit the CDC's Travelers' Health webpage for the latest health notices. . If you have some symptoms but not all symptoms, continue to monitor at home and seek medical attention  . if your symptoms worsen. . If you are having a medical emergency, call 911. >>>>>>>>>>>>>>>>>>>>>>>>>>>>   Preventive Care for Adults  A healthy lifestyle and preventive care can promote health and wellness. Preventive health guidelines for men include the following key practices:  A routine yearly physical is a good way to check with your health care provider about your health and preventative screening. It is a chance to share any concerns and updates on your health and to receive a thorough exam.  Visit your dentist for a routine exam and preventative care every 6 months. Brush your teeth twice a day and floss once a day. Good oral hygiene prevents tooth decay and gum disease.  The frequency of eye exams  is based on your age, health, family medical history, use of contact lenses, and other factors. Follow your health care provider's recommendations for frequency of eye exams.  Eat a healthy diet. Foods such as vegetables, fruits, whole grains, low-fat dairy products, and lean protein foods contain the nutrients you need without too many calories. Decrease your intake of foods high in solid fats, added sugars, and salt. Eat the right amount of calories for you. Get information about a proper diet from your health care provider, if necessary.  Regular physical exercise is one of the most important things you can do for your health. Most adults should get at least 150 minutes of moderate-intensity exercise (any activity that increases your heart rate and causes you to sweat) each week. In addition, most adults need muscle-strengthening exercises on 2 or more days a week.  Maintain a healthy weight. The body mass index (BMI) is a screening tool to identify possible weight problems. It provides an estimate of body fat based on height and weight. Your health care provider can find your BMI and can help you achieve or maintain a healthy weight. For adults 20 years and older:  A BMI below 18.5 is considered underweight.  A BMI of 18.5 to 24.9 is normal.  A BMI of 25 to 29.9 is considered overweight.  A BMI of 30 and above is considered obese.  Maintain normal blood lipids and cholesterol levels by exercising and minimizing your intake of saturated fat. Eat a balanced diet with plenty of fruit and vegetables. Blood tests for lipids and cholesterol should begin at age 20 and be repeated every 5 years. If your lipid or cholesterol levels are high, you are over 50, or you are at high risk for heart disease, you may need your cholesterol levels checked more frequently. Ongoing high lipid and cholesterol levels should be treated with medicines if diet and exercise are not working.  If you smoke, find out from  your health care provider how to quit. If you do not use tobacco, do not start.  Lung cancer screening is recommended for adults aged 55-80 years who are at high risk for developing lung cancer because of a history of smoking. A yearly low-dose CT scan of the lungs is recommended for people who have at least a 30-pack-year history of smoking and are a current smoker or have quit within the past 15 years. A pack year of smoking is smoking an average of 1 pack of cigarettes a day for 1 year (for example: 1 pack a day for 30 years or 2 packs a day for 15 years). Yearly screening should continue until the smoker has stopped smoking for at least 15 years. Yearly screening should be stopped for people who develop a health problem that would prevent them from having lung cancer treatment.  If you choose to drink alcohol,   do not have more than 2 drinks per day. One drink is considered to be 12 ounces (355 mL) of beer, 5 ounces (148 mL) of wine, or 1.5 ounces (44 mL) of liquor.  Avoid use of street drugs. Do not share needles with anyone. Ask for help if you need support or instructions about stopping the use of drugs.  High blood pressure causes heart disease and increases the risk of stroke. Your blood pressure should be checked at least every 1-2 years. Ongoing high blood pressure should be treated with medicines, if weight loss and exercise are not effective.  If you are 45-79 years old, ask your health care provider if you should take aspirin to prevent heart disease.  Diabetes screening involves taking a blood sample to check your fasting blood sugar level. This should be done once every 3 years, after age 45, if you are within normal weight and without risk factors for diabetes. Testing should be considered at a younger age or be carried out more frequently if you are overweight and have at least 1 risk factor for diabetes.  Colorectal cancer can be detected and often prevented. Most routine colorectal  cancer screening begins at the age of 50 and continues through age 75. However, your health care provider may recommend screening at an earlier age if you have risk factors for colon cancer. On a yearly basis, your health care provider may provide home test kits to check for hidden blood in the stool. Use of a small camera at the end of a tube to directly examine the colon (sigmoidoscopy or colonoscopy) can detect the earliest forms of colorectal cancer. Talk to your health care provider about this at age 50, when routine screening begins. Direct exam of the colon should be repeated every 5-10 years through age 75, unless early forms of precancerous polyps or small growths are found.   Talk with your health care provider about prostate cancer screening.  Testicular cancer screening isrecommended for adult males. Screening includes self-exam, a health care provider exam, and other screening tests. Consult with your health care provider about any symptoms you have or any concerns you have about testicular cancer.  Use sunscreen. Apply sunscreen liberally and repeatedly throughout the day. You should seek shade when your shadow is shorter than you. Protect yourself by wearing long sleeves, pants, a wide-brimmed hat, and sunglasses year round, whenever you are outdoors.  Once a month, do a whole-body skin exam, using a mirror to look at the skin on your back. Tell your health care provider about new moles, moles that have irregular borders, moles that are larger than a pencil eraser, or moles that have changed in shape or color.  Stay current with required vaccines (immunizations).  Influenza vaccine. All adults should be immunized every year.  Tetanus, diphtheria, and acellular pertussis (Td, Tdap) vaccine. An adult who has not previously received Tdap or who does not know his vaccine status should receive 1 dose of Tdap. This initial dose should be followed by tetanus and diphtheria toxoids (Td) booster  doses every 10 years. Adults with an unknown or incomplete history of completing a 3-dose immunization series with Td-containing vaccines should begin or complete a primary immunization series including a Tdap dose. Adults should receive a Td booster every 10 years.  Varicella vaccine. An adult without evidence of immunity to varicella should receive 2 doses or a second dose if he has previously received 1 dose.  Human papillomavirus (HPV) vaccine. Males aged 13-21   years who have not received the vaccine previously should receive the 3-dose series. Males aged 22-26 years may be immunized. Immunization is recommended through the age of 26 years for any male who has sex with males and did not get any or all doses earlier. Immunization is recommended for any person with an immunocompromised condition through the age of 26 years if he did not get any or all doses earlier. During the 3-dose series, the second dose should be obtained 4-8 weeks after the first dose. The third dose should be obtained 24 weeks after the first dose and 16 weeks after the second dose.  Zoster vaccine. One dose is recommended for adults aged 60 years or older unless certain conditions are present.    PREVNAR  - Pneumococcal 13-valent conjugate (PCV13) vaccine. When indicated, a person who is uncertain of his immunization history and has no record of immunization should receive the PCV13 vaccine. An adult aged 19 years or older who has certain medical conditions and has not been previously immunized should receive 1 dose of PCV13 vaccine. This PCV13 should be followed with a dose of pneumococcal polysaccharide (PPSV23) vaccine. The PPSV23 vaccine dose should be obtained at least 1 r more year(s) after the dose of PCV13 vaccine. An adult aged 19 years or older who has certain medical conditions and previously received 1 or more doses of PPSV23 vaccine should receive 1 dose of PCV13. The PCV13 vaccine dose should be obtained 1 or more  years after the last PPSV23 vaccine dose.    PNEUMOVAX - Pneumococcal polysaccharide (PPSV23) vaccine. When PCV13 is also indicated, PCV13 should be obtained first. All adults aged 65 years and older should be immunized. An adult younger than age 65 years who has certain medical conditions should be immunized. Any person who resides in a nursing home or long-term care facility should be immunized. An adult smoker should be immunized. People with an immunocompromised condition and certain other conditions should receive both PCV13 and PPSV23 vaccines. People with human immunodeficiency virus (HIV) infection should be immunized as soon as possible after diagnosis. Immunization during chemotherapy or radiation therapy should be avoided. Routine use of PPSV23 vaccine is not recommended for American Indians, [place] Natives, or people younger than 65 years unless there are medical conditions that require PPSV23 vaccine. When indicated, people who have unknown immunization and have no record of immunization should receive PPSV23 vaccine. One-time revaccination 5 years after the first dose of PPSV23 is recommended for people aged 19-64 years who have chronic kidney failure, nephrotic syndrome, asplenia, or immunocompromised conditions. People who received 1-2 doses of PPSV23 before age 65 years should receive another dose of PPSV23 vaccine at age 65 years or later if at least 5 years have passed since the previous dose. Doses of PPSV23 are not needed for people immunized with PPSV23 at or after age 65 years.    Hepatitis A vaccine. Adults who wish to be protected from this disease, have certain high-risk conditions, work with hepatitis A-infected animals, work in hepatitis A research labs, or travel to or work in countries with a high rate of hepatitis A should be immunized. Adults who were previously unvaccinated and who anticipate close contact with an international adoptee during the first 60 days after arrival  in the [place] from a country with a high rate of hepatitis A should be immunized.    Hepatitis B vaccine. Adults should be immunized if they wish to be protected from this disease, have certain   high-risk conditions, may be exposed to blood or other infectious body fluids, are household contacts or sex partners of hepatitis B positive people, are clients or workers in certain care facilities, or travel to or work in countries with a high rate of hepatitis B.   Preventive Service / Frequency   Ages 40 to 64  Blood pressure check.  Lipid and cholesterol check  Lung cancer screening. / Every year if you are aged 55-80 years and have a 30-pack-year history of smoking and currently smoke or have quit within the past 15 years. Yearly screening is stopped once you have quit smoking for at least 15 years or develop a health problem that would prevent you from having lung cancer treatment.  Fecal occult blood test (FOBT) of stool. / Every year beginning at age 50 and continuing until age 75. You may not have to do this test if you get a colonoscopy every 10 years.  Flexible sigmoidoscopy** or colonoscopy.** / Every 5 years for a flexible sigmoidoscopy or every 10 years for a colonoscopy beginning at age 50 and continuing until age 75. Screening for abdominal aortic aneurysm (AAA)  by ultrasound is recommended for people who have history of high blood pressure or who are current or former smokers. +++++++++++ Recommend Adult Low Dose Aspirin or  coated  Aspirin 81 mg daily  To reduce risk of Colon Cancer 20 %,  Skin Cancer 26 % ,  Malignant Melanoma 46%  and  Pancreatic cancer 60% ++++++++++++++++++++ Vitamin D goal  is between 70-100.  Please make sure that you are taking your Vitamin D as directed.  It is very important as a natural anti-inflammatory  helping hair, skin, and nails, as well as reducing stroke and heart attack risk.  It helps your bones and helps with mood. It also  decreases numerous cancer risks so please take it as directed.  Low Vit D is associated with a 200-300% higher risk for CANCER  and 200-300% higher risk for HEART   ATTACK  &  STROKE.   ...................................... It is also associated with higher death rate at younger ages,  autoimmune diseases like Rheumatoid arthritis, Lupus, Multiple Sclerosis.    Also many other serious conditions, like depression, Alzheimer's Dementia, infertility, muscle aches, fatigue, fibromyalgia - just to name a few. +++++++++++++++++++++ Recommend the book "The END of DIETING" by Dr Joel Fuhrman  & the book "The END of DIABETES " by Dr Joel Fuhrman At [employer] - get book & Audio CD's    Being diabetic has a  300% increased risk for heart attack, stroke, cancer, and alzheimer- type vascular dementia. It is very important that you work harder with diet by avoiding all foods that are white. Avoid white rice (brown & wild rice is OK), white potatoes (sweetpotatoes in moderation is OK), White bread or wheat bread or anything made out of white flour like bagels, donuts, rolls, buns, biscuits, cakes, pastries, cookies, pizza crust, and pasta (made from white flour & egg whites) - vegetarian pasta or spinach or wheat pasta is OK. Multigrain breads like Arnold's or [employer], or multigrain sandwich thins or flatbreads.  Diet, exercise and weight loss can reverse and cure diabetes in the early stages.  Diet, exercise and weight loss is very important in the control and prevention of complications of diabetes which affects every system in your body, ie. Brain - dementia/stroke, eyes - glaucoma/blindness, heart - heart attack/heart failure, kidneys - dialysis, stomach - gastric paralysis, intestines - malabsorption,   nerves - severe painful neuritis, circulation - gangrene & loss of a leg(s), and finally cancer and Alzheimers.    I recommend avoid fried & greasy foods,  sweets/candy, white rice (brown or wild rice or  Quinoa is OK), white potatoes (sweet potatoes are OK) - anything made from white flour - bagels, doughnuts, rolls, buns, biscuits,white and wheat breads, pizza crust and traditional pasta made of white flour & egg white(vegetarian pasta or spinach or wheat pasta is OK).  Multi-grain bread is OK - like multi-grain flat bread or sandwich thins. Avoid alcohol in excess. Exercise is also important.    Eat all the vegetables you want - avoid meat, especially red meat and dairy - especially cheese.  Cheese is the most concentrated form of trans-fats which is the worst thing to clog up our arteries. Veggie cheese is OK which can be found in the fresh produce section at [employer] or [employer] or [employer]  ++++++++++++++++++++++ DASH Eating Plan  DASH stands for "Dietary Approaches to Stop Hypertension."   The DASH eating plan is a healthy eating plan that has been shown to reduce high blood pressure (hypertension). Additional health benefits may include reducing the risk of type 2 diabetes mellitus, heart disease, and stroke. The DASH eating plan may also help with weight loss. WHAT DO I NEED TO KNOW ABOUT THE DASH EATING PLAN? For the DASH eating plan, you will follow these general guidelines:  Choose foods with a percent daily value for sodium of less than 5% (as listed on the food label).  Use salt-free seasonings or herbs instead of table salt or sea salt.  Check with your health care provider or pharmacist before using salt substitutes.  Eat lower-sodium products, often labeled as "lower sodium" or "no salt added."  Eat fresh foods.  Eat more vegetables, fruits, and low-fat dairy products.  Choose whole grains. Look for the word "whole" as the first word in the ingredient list.  Choose fish   Limit sweets, desserts, sugars, and sugary drinks.  Choose heart-healthy fats.  Eat veggie cheese   Eat more home-cooked food and less restaurant, buffet, and fast food.  Limit fried  foods.  Cook foods using methods other than frying.  Limit canned vegetables. If you do use them, rinse them well to decrease the sodium.  When eating at a restaurant, ask that your food be prepared with less salt, or no salt if possible.                      WHAT FOODS CAN I EAT? Read Dr Joel Fuhrman's books on The End of Dieting & The End of Diabetes  Grains Whole grain or whole wheat bread. Brown rice. Whole grain or whole wheat pasta. Quinoa, bulgur, and whole grain cereals. Low-sodium cereals. Corn or whole wheat flour tortillas. Whole grain cornbread. Whole grain crackers. Low-sodium crackers.  Vegetables Fresh or frozen vegetables (raw, steamed, roasted, or grilled). Low-sodium or reduced-sodium tomato and vegetable juices. Low-sodium or reduced-sodium tomato sauce and paste. Low-sodium or reduced-sodium canned vegetables.   Fruits All fresh, canned (in natural juice), or frozen fruits.  Protein Products  All fish and seafood.  Dried beans, peas, or lentils. Unsalted nuts and seeds. Unsalted canned beans.  Dairy Low-fat dairy products, such as skim or 1% milk, 2% or reduced-fat cheeses, low-fat ricotta or cottage cheese, or plain low-fat yogurt. Low-sodium or reduced-sodium cheeses.  Fats and Oils Tub margarines without trans fats. Light or reduced-fat mayonnaise   and salad dressings (reduced sodium). Avocado. Safflower, olive, or canola oils. Natural peanut or almond butter.  Other Unsalted popcorn and pretzels. The items listed above may not be a complete list of recommended foods or beverages. Contact your dietitian for more options.  +++++++++++++++++++  WHAT FOODS ARE NOT RECOMMENDED? Grains/ White flour or wheat flour White bread. White pasta. White rice. Refined cornbread. Bagels and croissants. Crackers that contain trans fat.  Vegetables  Creamed or fried vegetables. Vegetables in a . Regular canned vegetables. Regular canned tomato sauce and paste. Regular  tomato and vegetable juices.  Fruits Dried fruits. Canned fruit in light or heavy syrup. Fruit juice.  Meat and Other Protein Products Meat in general - RED meat & White meat.  Fatty cuts of meat. Ribs, chicken wings, all processed meats as bacon, sausage, bologna, salami, fatback, hot dogs, bratwurst and packaged luncheon meats.  Dairy Whole or 2% milk, cream, half-and-half, and cream cheese. Whole-fat or sweetened yogurt. Full-fat cheeses or blue cheese. Non-dairy creamers and whipped toppings. Processed cheese, cheese spreads, or cheese curds.  Condiments Onion and garlic salt, seasoned salt, table salt, and sea salt. Canned and packaged gravies. Worcestershire sauce. Tartar sauce. Barbecue sauce. Teriyaki sauce. Soy sauce, including reduced sodium. Steak sauce. Fish sauce. Oyster sauce. Cocktail sauce. Horseradish. Ketchup and mustard. Meat flavorings and tenderizers. Bouillon cubes. Hot sauce. Tabasco sauce. Marinades. Taco seasonings. Relishes.  Fats and Oils Butter, stick margarine, lard, shortening and bacon fat. Coconut, palm kernel, or palm oils. Regular salad dressings.  Pickles and olives. Salted popcorn and pretzels.  The items listed above may not be a complete list of foods and beverages to avoid.    

## 2018-07-29 ENCOUNTER — Other Ambulatory Visit: Payer: Self-pay | Admitting: Adult Health

## 2018-07-29 DIAGNOSIS — E349 Endocrine disorder, unspecified: Secondary | ICD-10-CM

## 2018-07-30 ENCOUNTER — Other Ambulatory Visit: Payer: Self-pay | Admitting: Internal Medicine

## 2018-07-30 ENCOUNTER — Other Ambulatory Visit: Payer: Self-pay

## 2018-07-30 DIAGNOSIS — F988 Other specified behavioral and emotional disorders with onset usually occurring in childhood and adolescence: Secondary | ICD-10-CM

## 2018-07-30 MED ORDER — AMPHETAMINE-DEXTROAMPHETAMINE 20 MG PO TABS: ORAL_TABLET | ORAL | 0 refills | Status: DC

## 2018-10-05 ENCOUNTER — Other Ambulatory Visit: Payer: Self-pay

## 2018-10-05 DIAGNOSIS — F988 Other specified behavioral and emotional disorders with onset usually occurring in childhood and adolescence: Secondary | ICD-10-CM

## 2018-10-05 MED ORDER — TRAMADOL HCL 50 MG PO TABS: ORAL_TABLET | ORAL | 0 refills | Status: DC

## 2018-10-05 MED ORDER — AMPHETAMINE-DEXTROAMPHETAMINE 20 MG PO TABS: ORAL_TABLET | ORAL | 0 refills | Status: DC

## 2018-10-19 ENCOUNTER — Other Ambulatory Visit: Payer: Self-pay

## 2018-10-19 MED ORDER — BUTALBITAL-APAP-CAFFEINE 50-325-40 MG PO TABS: ORAL_TABLET | ORAL | 0 refills | Status: DC

## 2018-10-25 ENCOUNTER — Ambulatory Visit: Payer: Self-pay | Admitting: Internal Medicine

## 2018-11-11 ENCOUNTER — Encounter: Payer: Self-pay | Admitting: Internal Medicine

## 2018-11-11 DIAGNOSIS — F988 Other specified behavioral and emotional disorders with onset usually occurring in childhood and adolescence: Secondary | ICD-10-CM | POA: Insufficient documentation

## 2018-11-11 NOTE — Progress Notes (Signed)
History of Present Illness:      This very nice 58 y.o. MWM presents for 6 month follow up with HTN, HLD, Pre-Diabetes/Insulin Resistance  and Vitamin D Deficiency. Patient has GERD controlled w/diet/meds. He has OSA/CPAP with improved sleep hygiene. Patient has ADD and reports improved focus/concentration on Adderall. Patient also reports some vague mild Rt flank discomfort over the last 2 weeks (no hx/o kidney stones). Denies sx's of blood in urine.      Patient is treated for HTN (2007)  & BP has been controlled at home. Today's BP is at goal -120/82. Patient has had no complaints of any cardiac type chest pain, palpitations, dyspnea / orthopnea / PND, dizziness, claudication, or dependent edema.      Hyperlipidemia is controlled with diet & meds. Patient denies myalgias or other med SE's. Last Lipids were at goal albeit elevated trig's: Lab Results  Component Value Date   CHOL 149 04/13/2018   HDL 36 (L) 04/13/2018   LDLCALC 85 04/13/2018   TRIG 181 (H) 04/13/2018   CHOLHDL 4.1 04/13/2018       Also, the patient has history of  PreDiabetes & Insulin Resistance (A1c 5.4% / Insulin 45 / 2015)   and he has had no symptoms of reactive hypoglycemia, diabetic polys, paresthesias or visual blurring.  Last A1c was Normal & at goal: Lab Results  Component Value Date   HGBA1C 5.3 04/13/2018       Patient has hx/o Testosterone Deficiency & is on parenteral replacement with improved sense of well-being.      Further, the patient also has history of Vitamin D Deficiency and supplements vitamin D without any suspected side-effects. Last vitamin D was at goal: Lab Results  Component Value Date   VD25OH 81 04/13/2018   Current Outpatient Medications on File Prior to Visit  Medication Sig  . amphetamine-dextroamphetamine (ADDERALL) 20 MG tablet Take 1/2 to 1 tablet 1 or 2 x /day for Focus & Concentration  . butalbital-acetaminophen-caffeine (FIORICET) 50-325-40 MG tablet TAKE 1 TO 2 TABLETS  4 TIMES A DAY ONLY IF NEEDED FOR SEVERE HEADACHE  . fluticasone (FLONASE) 50 MCG/ACT nasal spray Place into both nostrils as needed.   Marland Kitchen KRILL OIL PO Take by mouth daily. Reported on 07/15/2015  . lisinopril (PRINIVIL,ZESTRIL) 20 MG tablet TAKE 1 TABLET BY MOUTH  DAILY  . omeprazole (PRILOSEC) 40 MG capsule Take 1 capsule daily at Suppertime  for Acid Reflux  . OVER THE COUNTER MEDICATION Zyrtec OTC 1 tablet daily PRN.  . pregabalin (LYRICA) 75 MG capsule Take 1 capsule 2 x /day for pain (Patient taking differently: as needed. Take 1 capsule 2 x /day for pain)  . rosuvastatin (CRESTOR) 5 MG tablet TAKE 1 TABLET BY MOUTH  DAILY FOR CHOLESTEROL  . SYRINGE-NEEDLE, DISP, 3 ML (B-D 3CC LUER-LOK SYR 22GX1") 22G X 1" 3 ML MISC Checks sugar 1 daily for fluctuating sugars for prediabetes  . testosterone cypionate (DEPOTESTOSTERONE CYPIONATE) 200 MG/ML injection Inject 2 ml intramuscular every 14 days  . traMADol (ULTRAM) 50 MG tablet Take 1 tablet every 4 hours ONLY if needed for SEVERE pain   No current facility-administered medications on file prior to visit.    No Known Allergies  PMHx:   Past Medical History:  Diagnosis Date  . Allergy   . Hyperlipidemia   . Hypertension   . OSA (obstructive sleep apnea)    Immunization History  Administered Date(s) Administered  . Influenza Inj Mdck  Quad With Preservative 01/03/2018  . Influenza,inj,quad, With Preservative 01/29/2016  . Influenza-Unspecified 01/30/2017  . PPD Test 10/25/2013, 12/22/2014, 01/29/2016, 04/13/2018  . Pneumococcal Polysaccharide-23 12/22/2014  . Tdap 10/25/2013   Past Surgical History:  Procedure Laterality Date  . APPENDECTOMY  1983  . HAND SURGERY Right 2007   FHx:    Reviewed / unchanged  SHx:    Reviewed / unchanged   Systems Review:  Constitutional: Denies fever, chills, wt changes, headaches, insomnia, fatigue, night sweats, change in appetite. Eyes: Denies redness, blurred vision, diplopia, discharge, itchy,  watery eyes.  ENT: Denies discharge, congestion, post nasal drip, epistaxis, sore throat, earache, hearing loss, dental pain, tinnitus, vertigo, sinus pain, snoring.  CV: Denies chest pain, palpitations, irregular heartbeat, syncope, dyspnea, diaphoresis, orthopnea, PND, claudication or edema. Respiratory: denies cough, dyspnea, DOE, pleurisy, hoarseness, laryngitis, wheezing.  Gastrointestinal: Denies dysphagia, odynophagia, heartburn, reflux, water brash, abdominal pain or cramps, nausea, vomiting, bloating, diarrhea, constipation, hematemesis, melena, hematochezia  or hemorrhoids. Genitourinary: Denies dysuria, frequency, urgency, nocturia, hesitancy, discharge, hematuria or flank pain. Musculoskeletal: Denies arthralgias, myalgias, stiffness, jt. swelling, pain, limping or strain/sprain.  Skin: Denies pruritus, rash, hives, warts, acne, eczema or change in skin lesion(s). Neuro: No weakness, tremor, incoordination, spasms, paresthesia or pain. Psychiatric: Denies confusion, memory loss or sensory loss. Endo: Denies change in weight, skin or hair change.  Heme/Lymph: No excessive bleeding, bruising or enlarged lymph nodes.  Physical Exam  BP 120/82   Pulse 84   Temp (!) 97.1 F (36.2 C)   Resp 16   Ht 5\' 9"  (1.753 m)   Wt 185 lb 3.2 oz (84 kg)   BMI 27.35 kg/m   Appears  well nourished, well groomed  and in no distress.  Eyes: PERRLA, EOMs, conjunctiva no swelling or erythema. Sinuses: No frontal/maxillary tenderness ENT/Mouth: EAC's clear, TM's nl w/o erythema, bulging. Nares clear w/o erythema, swelling, exudates. Oropharynx clear without erythema or exudates. Oral hygiene is good. Tongue normal, non obstructing. Hearing intact.  Neck: Supple. Thyroid not palpable. Car 2+/2+ without bruits, nodes or JVD. Chest: Respirations nl with BS clear & equal w/o rales, rhonchi, wheezing or stridor.  Cor: Heart sounds normal w/ regular rate and rhythm without sig. murmurs, gallops, clicks  or rubs. Peripheral pulses normal and equal  without edema.  Abdomen: Soft & bowel sounds normal. Non-tender w/o guarding, rebound, hernias, masses or organomegaly.  Lymphatics: Unremarkable.  Musculoskeletal: Full ROM all peripheral extremities, joint stability, 5/5 strength and normal gait.  Skin: Warm, dry without exposed rashes, lesions or ecchymosis apparent.  Neuro: Cranial nerves intact, reflexes equal bilaterally. Sensory-motor testing grossly intact. Tendon reflexes grossly intact.  Pysch: Alert & oriented x 3.  Insight and judgement nl & appropriate. No ideations.  Assessment and Plan:  1. Essential hypertension  - Continue medication, monitor blood pressure at home.  - Continue DASH diet.  Reminder to go to the ER if any CP,  SOB, nausea, dizziness, severe HA, changes vision/speech.  - CBC with Differential/Platelet - COMPLETE METABOLIC PANEL WITH GFR - Magnesium - TSH  2. Hyperlipidemia, mixed  - Continue diet/meds, exercise,& lifestyle modifications.  - Continue monitor periodic cholesterol/liver & renal functions   - Lipid panel - TSH  3. Insulin resistance / Prediabetes  - Continue diet, exercise  - Lifestyle modifications.  - Monitor appropriate labs.  - Hemoglobin A1c - Insulin, random  4. Vitamin D deficiency  - Continue supplementation.  - VITAMIN D 25 Hydroxyl  5. 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 Medication management  - CBC with Differential/Platelet - COMPLETE METABOLIC PANEL WITH GFR - Magnesium - Lipid panel - TSH - Hemoglobin A1c - Insulin, random - Testosterone  8. Flank pain  - Urinalysis, Routine w reflex microscopic         Discussed  regular exercise, BP monitoring, weight control to achieve/maintain BMI less than 25 and discussed med and SE's. Recommended labs to assess and monitor clinical status with further disposition pending results of labs.  I  discussed the assessment and treatment plan with the patient. The patient was provided an opportunity to ask questions and all were answered. The patient agreed with the plan and demonstrated an understanding of the instructions.   I provided over 30 minutes of exam, counseling, chart review and  complex critical decision making.  Kirtland Bouchard, MD

## 2018-11-11 NOTE — Patient Instructions (Signed)

## 2018-11-12 ENCOUNTER — Ambulatory Visit (INDEPENDENT_AMBULATORY_CARE_PROVIDER_SITE_OTHER): Payer: 59 | Admitting: Internal Medicine

## 2018-11-12 ENCOUNTER — Other Ambulatory Visit: Payer: Self-pay

## 2018-11-12 VITALS — BP 120/82 | HR 84 | Temp 97.1°F | Resp 16 | Ht 69.0 in | Wt 185.2 lb

## 2018-11-12 DIAGNOSIS — Z79899 Other long term (current) drug therapy: Secondary | ICD-10-CM

## 2018-11-12 DIAGNOSIS — E782 Mixed hyperlipidemia: Secondary | ICD-10-CM | POA: Diagnosis not present

## 2018-11-12 DIAGNOSIS — F988 Other specified behavioral and emotional disorders with onset usually occurring in childhood and adolescence: Secondary | ICD-10-CM

## 2018-11-12 DIAGNOSIS — I10 Essential (primary) hypertension: Secondary | ICD-10-CM | POA: Diagnosis not present

## 2018-11-12 DIAGNOSIS — E349 Endocrine disorder, unspecified: Secondary | ICD-10-CM

## 2018-11-12 DIAGNOSIS — E8881 Metabolic syndrome: Secondary | ICD-10-CM

## 2018-11-12 DIAGNOSIS — E559 Vitamin D deficiency, unspecified: Secondary | ICD-10-CM

## 2018-11-12 DIAGNOSIS — R109 Unspecified abdominal pain: Secondary | ICD-10-CM

## 2018-11-13 LAB — CBC WITH DIFFERENTIAL/PLATELET
Absolute Monocytes: 421 cells/uL (ref 200–950)
Basophils Absolute: 67 cells/uL (ref 0–200)
Basophils Relative: 1.1 %
Eosinophils Absolute: 201 cells/uL (ref 15–500)
Eosinophils Relative: 3.3 %
HCT: 50.4 % — ABNORMAL HIGH (ref 38.5–50.0)
Hemoglobin: 16.7 g/dL (ref 13.2–17.1)
Lymphs Abs: 1147 cells/uL (ref 850–3900)
MCH: 27.8 pg (ref 27.0–33.0)
MCHC: 33.1 g/dL (ref 32.0–36.0)
MCV: 84 fL (ref 80.0–100.0)
MPV: 9.3 fL (ref 7.5–12.5)
Monocytes Relative: 6.9 %
Neutro Abs: 4264 cells/uL (ref 1500–7800)
Neutrophils Relative %: 69.9 %
Platelets: 295 10*3/uL (ref 140–400)
RBC: 6 10*6/uL — ABNORMAL HIGH (ref 4.20–5.80)
RDW: 13.2 % (ref 11.0–15.0)
Total Lymphocyte: 18.8 %
WBC: 6.1 10*3/uL (ref 3.8–10.8)

## 2018-11-13 LAB — URINALYSIS, ROUTINE W REFLEX MICROSCOPIC
Bilirubin Urine: NEGATIVE
Glucose, UA: NEGATIVE
Hgb urine dipstick: NEGATIVE
Ketones, ur: NEGATIVE
Leukocytes,Ua: NEGATIVE
Nitrite: NEGATIVE
Protein, ur: NEGATIVE
Specific Gravity, Urine: 1.009 (ref 1.001–1.03)
pH: 7.5 (ref 5.0–8.0)

## 2018-11-13 LAB — COMPLETE METABOLIC PANEL WITH GFR
AG Ratio: 1.8 (calc) (ref 1.0–2.5)
ALT: 29 U/L (ref 9–46)
AST: 23 U/L (ref 10–35)
Albumin: 4.1 g/dL (ref 3.6–5.1)
Alkaline phosphatase (APISO): 46 U/L (ref 35–144)
BUN: 9 mg/dL (ref 7–25)
CO2: 27 mmol/L (ref 20–32)
Calcium: 9.3 mg/dL (ref 8.6–10.3)
Chloride: 104 mmol/L (ref 98–110)
Creat: 0.93 mg/dL (ref 0.70–1.33)
GFR, Est African American: 105 mL/min/{1.73_m2} (ref >=60)
GFR, Est Non African American: 91 mL/min/{1.73_m2} (ref >=60)
Globulin: 2.3 g/dL (calc) (ref 1.9–3.7)
Glucose, Bld: 82 mg/dL (ref 65–99)
Potassium: 4.3 mmol/L (ref 3.5–5.3)
Sodium: 139 mmol/L (ref 135–146)
Total Bilirubin: 0.7 mg/dL (ref 0.2–1.2)
Total Protein: 6.4 g/dL (ref 6.1–8.1)

## 2018-11-13 LAB — LIPID PANEL
Cholesterol: 166 mg/dL (ref <200)
HDL: 35 mg/dL — ABNORMAL LOW (ref >=40)
LDL Cholesterol (Calc): 105 mg/dL (calc) — ABNORMAL HIGH
Non-HDL Cholesterol (Calc): 131 mg/dL (calc) — ABNORMAL HIGH (ref <130)
Total CHOL/HDL Ratio: 4.7 (calc) (ref <5.0)
Triglycerides: 146 mg/dL (ref <150)

## 2018-11-13 LAB — MAGNESIUM: Magnesium: 2 mg/dL (ref 1.5–2.5)

## 2018-11-13 LAB — HEMOGLOBIN A1C
Hgb A1c MFr Bld: 5.3 % of total Hgb (ref <5.7)
Mean Plasma Glucose: 105 (calc)
eAG (mmol/L): 5.8 (calc)

## 2018-11-13 LAB — INSULIN, RANDOM: Insulin: 7.2 u[IU]/mL

## 2018-11-13 LAB — TSH: TSH: 2.65 mIU/L (ref 0.40–4.50)

## 2018-11-13 LAB — TESTOSTERONE: Testosterone: 1129 ng/dL — ABNORMAL HIGH (ref 250–827)

## 2018-11-13 LAB — VITAMIN D 25 HYDROXY (VIT D DEFICIENCY, FRACTURES): Vit D, 25-Hydroxy: 80 ng/mL (ref 30–100)

## 2018-12-11 ENCOUNTER — Other Ambulatory Visit: Payer: Self-pay

## 2018-12-11 DIAGNOSIS — F988 Other specified behavioral and emotional disorders with onset usually occurring in childhood and adolescence: Secondary | ICD-10-CM

## 2018-12-11 MED ORDER — AMPHETAMINE-DEXTROAMPHETAMINE 20 MG PO TABS: ORAL_TABLET | ORAL | 0 refills | Status: DC

## 2019-02-13 NOTE — Progress Notes (Signed)
FOLLOW UP  Assessment and Plan:   Hypertension Monitor blood pressure at home; patient to call if consistently greater than 130/80 Continue DASH diet.   Reminder to go to the ER if any CP, SOB, nausea, dizziness, severe HA, changes vision/speech, left arm numbness and tingling and jaw pain.  Cholesterol Currently at LDL goal; continue rosuvastatin 5 mg daily Continue low cholesterol diet and exercise.  Check lipid panel.   Other abnormal glucose Recent A1Cs at goal Continue diet and exercise.  Perform daily foot/skin check, notify office of any concerning changes.  Defer A1C; check CMP  Overweight Long discussion about weight loss, diet, and exercise Recommended diet heavy in fruits and veggies and low in animal meats, cheeses, and dairy products, appropriate calorie intake Discussed ideal weight for height (165-170 lb) and initial weight goal (175lb) Patient will work on walking more, decreasing sugar/pasta Will follow up in 3 months  Vitamin D Def At goal at last visit; continue supplementation to maintain goal of 70-100 Defer Vit D level  Hypogonadism - continue to monitor, states medication is helping with symptoms of low T.   Continue diet and meds as discussed. Further disposition pending results of labs. Discussed med's effects and SE's.   Over 30 minutes of exam, counseling, chart review, and critical decision making was performed.   Future Appointments  Date Time Provider [place]  05/22/2019 11:00 AM [name], MD [facility] None    ----------------------------------------------------------------------------------------------------------------------  HPI 58 y.o. male  presents for 3 month follow up on hypertension, cholesterol, glucose management, weight and vitamin D deficiency.   BMI is Body mass index is 27.47 kg/m., he has not been working on diet and exercise particularly, has been too busy with work and due to heat. He avoids fast  food Wt Readings from Last 3 Encounters:  02/15/19 186 lb (84.4 kg)  11/12/18 185 lb 3.2 oz (84 kg)  07/17/18 174 lb 12.8 oz (79.3 kg)   His blood pressure has been controlled at home, today their BP is BP: 120/78  He does workout. He denies chest pain, shortness of breath, dizziness.   He is on cholesterol medication (crestor 5 mg daily) and denies myalgias. His LDL cholesterol is not at goal; trigs remain elevated. The cholesterol last visit was:   Lab Results  Component Value Date   CHOL 166 11/12/2018   HDL 35 (L) 11/12/2018   LDLCALC 105 (H) 11/12/2018   TRIG 146 11/12/2018   CHOLHDL 4.7 11/12/2018    He has been working on diet and exercise for glucose management for hx of elevated A1C/insulin resistance, and denies foot ulcerations, increased appetite, nausea, paresthesia of the feet, polydipsia, polyuria, visual disturbances, vomiting and weight loss. Last A1C in the office was:  Lab Results  Component Value Date   HGBA1C 5.3 11/12/2018   Patient is on Vitamin D supplement and at goal:   Lab Results  Component Value Date   VD25OH 77 11/12/2018     He has a history of testosterone deficiency and is on testosterone replacement, he is due for injection this weekend He states that the testosterone helps with his energy, libido, muscle mass. Lab Results  Component Value Date   TESTOSTERONE 1,129 (H) 11/12/2018      Current Medications:  Current Outpatient Medications on File Prior to Visit  Medication Sig  . amphetamine-dextroamphetamine (ADDERALL) 20 MG tablet Take 1/2 to 1 tablet 1 or 2 x /day for Focus & Concentration  . butalbital-acetaminophen-caffeine (FIORICET) 50-325-40 MG  tablet TAKE 1 TO 2 TABLETS 4 TIMES A DAY ONLY IF NEEDED FOR SEVERE HEADACHE  . fluticasone (FLONASE) 50 MCG/ACT nasal spray Place into both nostrils as needed.   Marland Kitchen KRILL OIL PO Take by mouth daily. Reported on 07/15/2015  . lisinopril (PRINIVIL,ZESTRIL) 20 MG tablet TAKE 1 TABLET BY MOUTH  DAILY   . omeprazole (PRILOSEC) 40 MG capsule Take 1 capsule daily at Suppertime  for Acid Reflux  . OVER THE COUNTER MEDICATION Zyrtec OTC 1 tablet daily PRN.  . pregabalin (LYRICA) 75 MG capsule Take 1 capsule 2 x /day for pain (Patient taking differently: as needed. Take 1 capsule 2 x /day for pain)  . rosuvastatin (CRESTOR) 5 MG tablet TAKE 1 TABLET BY MOUTH  DAILY FOR CHOLESTEROL  . SYRINGE-NEEDLE, DISP, 3 ML (B-D 3CC LUER-LOK SYR 22GX1") 22G X 1" 3 ML MISC Checks sugar 1 daily for fluctuating sugars for prediabetes  . testosterone cypionate (DEPOTESTOSTERONE CYPIONATE) 200 MG/ML injection Inject 2 ml intramuscular every 14 days  . traMADol (ULTRAM) 50 MG tablet Take 1 tablet every 4 hours ONLY if needed for SEVERE pain   No current facility-administered medications on file prior to visit.      Allergies:  No Known Allergies   Medical History:  Past Medical History:  Diagnosis Date  . Allergy   . Hyperlipidemia   . Hypertension   . OSA (obstructive sleep apnea)    Family history- Reviewed and unchanged Social history- Reviewed and unchanged   Review of Systems:  Review of Systems  Constitutional: Negative for malaise/fatigue and weight loss.  HENT: Negative for hearing loss and tinnitus.   Eyes: Negative for blurred vision and double vision.  Respiratory: Negative for cough, shortness of breath and wheezing.   Cardiovascular: Negative for chest pain, palpitations, orthopnea, claudication and leg swelling.  Gastrointestinal: Negative for abdominal pain, blood in stool, constipation, diarrhea, heartburn, melena, nausea and vomiting.  Genitourinary: Negative.   Musculoskeletal: Negative for joint pain and myalgias.  Skin: Negative for rash.  Neurological: Negative for dizziness, tingling, sensory change, weakness and headaches.  Endo/Heme/Allergies: Negative for polydipsia.  Psychiatric/Behavioral: Negative.   All other systems reviewed and are negative.    Physical Exam: BP  120/78   Pulse 66   Temp 97.9 F (36.6 C)   Ht 5\' 9"  (1.753 m)   Wt 186 lb (84.4 kg)   SpO2 97%   BMI 27.47 kg/m  Wt Readings from Last 3 Encounters:  02/15/19 186 lb (84.4 kg)  11/12/18 185 lb 3.2 oz (84 kg)  07/17/18 174 lb 12.8 oz (79.3 kg)   General Appearance: Well nourished, in no apparent distress. Eyes: PERRLA, EOMs, conjunctiva no swelling or erythema Sinuses: No Frontal/maxillary tenderness ENT/Mouth: Ext aud canals clear, TMs without erythema, bulging. No erythema, swelling, or exudate on post pharynx.  Tonsils not swollen or erythematous. Hearing normal.  Neck: Supple, thyroid normal.  Respiratory: Respiratory effort normal, BS equal bilaterally without rales, rhonchi, wheezing or stridor.  Cardio: RRR with no MRGs. Brisk peripheral pulses without edema.  Abdomen: Soft, + BS.  Non tender, no guarding, rebound, hernias, masses. Lymphatics: Non tender without lymphadenopathy.  Musculoskeletal: Full ROM, 5/5 strength, Normal gait Skin: Warm, dry without rashes, lesions, ecchymosis.  Neuro: Cranial nerves intact. No cerebellar symptoms.  Psych: Awake and oriented X 3, normal affect, Insight and Judgment appropriate.    Vicie Mutters, PA-C 8:54 AM [facility]

## 2019-02-15 ENCOUNTER — Encounter: Payer: Self-pay | Admitting: Physician Assistant

## 2019-02-15 ENCOUNTER — Other Ambulatory Visit: Payer: Self-pay

## 2019-02-15 ENCOUNTER — Ambulatory Visit: Payer: 59 | Admitting: Physician Assistant

## 2019-02-15 VITALS — BP 120/78 | HR 66 | Temp 97.9°F | Ht 69.0 in | Wt 186.0 lb

## 2019-02-15 DIAGNOSIS — Z79899 Other long term (current) drug therapy: Secondary | ICD-10-CM | POA: Diagnosis not present

## 2019-02-15 DIAGNOSIS — E782 Mixed hyperlipidemia: Secondary | ICD-10-CM

## 2019-02-15 DIAGNOSIS — E349 Endocrine disorder, unspecified: Secondary | ICD-10-CM | POA: Diagnosis not present

## 2019-02-15 DIAGNOSIS — I10 Essential (primary) hypertension: Secondary | ICD-10-CM | POA: Diagnosis not present

## 2019-02-15 DIAGNOSIS — E559 Vitamin D deficiency, unspecified: Secondary | ICD-10-CM

## 2019-02-15 MED ORDER — TESTOSTERONE CYPIONATE 200 MG/ML IM SOLN: INTRAMUSCULAR | 3 refills | Status: DC

## 2019-02-15 NOTE — Patient Instructions (Signed)

## 2019-02-16 LAB — TSH: TSH: 3.34 mIU/L (ref 0.40–4.50)

## 2019-02-16 LAB — CBC WITH DIFFERENTIAL/PLATELET
Absolute Monocytes: 439 cells/uL (ref 200–950)
Basophils Absolute: 79 cells/uL (ref 0–200)
Basophils Relative: 1.3 %
Eosinophils Absolute: 397 cells/uL (ref 15–500)
Eosinophils Relative: 6.5 %
HCT: 48.2 % (ref 38.5–50.0)
Hemoglobin: 15.9 g/dL (ref 13.2–17.1)
Lymphs Abs: 1135 cells/uL (ref 850–3900)
MCH: 27.4 pg (ref 27.0–33.0)
MCHC: 33 g/dL (ref 32.0–36.0)
MCV: 83.1 fL (ref 80.0–100.0)
MPV: 9.7 fL (ref 7.5–12.5)
Monocytes Relative: 7.2 %
Neutro Abs: 4050 cells/uL (ref 1500–7800)
Neutrophils Relative %: 66.4 %
Platelets: 293 10*3/uL (ref 140–400)
RBC: 5.8 10*6/uL (ref 4.20–5.80)
RDW: 13.1 % (ref 11.0–15.0)
Total Lymphocyte: 18.6 %
WBC: 6.1 10*3/uL (ref 3.8–10.8)

## 2019-02-16 LAB — LIPID PANEL
Cholesterol: 140 mg/dL (ref <200)
HDL: 42 mg/dL (ref >=40)
LDL Cholesterol (Calc): 78 mg/dL (calc)
Non-HDL Cholesterol (Calc): 98 mg/dL (calc) (ref <130)
Total CHOL/HDL Ratio: 3.3 (calc) (ref <5.0)
Triglycerides: 116 mg/dL (ref <150)

## 2019-02-16 LAB — COMPLETE METABOLIC PANEL WITH GFR
AG Ratio: 1.8 (calc) (ref 1.0–2.5)
ALT: 40 U/L (ref 9–46)
AST: 32 U/L (ref 10–35)
Albumin: 4.2 g/dL (ref 3.6–5.1)
Alkaline phosphatase (APISO): 50 U/L (ref 35–144)
BUN: 15 mg/dL (ref 7–25)
CO2: 28 mmol/L (ref 20–32)
Calcium: 9.6 mg/dL (ref 8.6–10.3)
Chloride: 104 mmol/L (ref 98–110)
Creat: 1.04 mg/dL (ref 0.70–1.33)
GFR, Est African American: 92 mL/min/{1.73_m2} (ref >=60)
GFR, Est Non African American: 79 mL/min/{1.73_m2} (ref >=60)
Globulin: 2.3 g/dL (calc) (ref 1.9–3.7)
Glucose, Bld: 81 mg/dL (ref 65–99)
Potassium: 4.4 mmol/L (ref 3.5–5.3)
Sodium: 140 mmol/L (ref 135–146)
Total Bilirubin: 0.6 mg/dL (ref 0.2–1.2)
Total Protein: 6.5 g/dL (ref 6.1–8.1)

## 2019-02-16 LAB — MAGNESIUM: Magnesium: 2 mg/dL (ref 1.5–2.5)

## 2019-02-22 ENCOUNTER — Other Ambulatory Visit: Payer: Self-pay

## 2019-02-22 DIAGNOSIS — F988 Other specified behavioral and emotional disorders with onset usually occurring in childhood and adolescence: Secondary | ICD-10-CM

## 2019-02-22 MED ORDER — BUTALBITAL-APAP-CAFFEINE 50-325-40 MG PO TABS: ORAL_TABLET | ORAL | 0 refills | Status: DC

## 2019-02-22 MED ORDER — AMPHETAMINE-DEXTROAMPHETAMINE 20 MG PO TABS: ORAL_TABLET | ORAL | 0 refills | Status: DC

## 2019-03-23 ENCOUNTER — Other Ambulatory Visit: Payer: Self-pay | Admitting: Internal Medicine

## 2019-04-03 ENCOUNTER — Other Ambulatory Visit: Payer: Self-pay

## 2019-04-03 MED ORDER — TRAMADOL HCL 50 MG PO TABS: ORAL_TABLET | ORAL | 0 refills | Status: DC

## 2019-04-23 ENCOUNTER — Other Ambulatory Visit: Payer: Self-pay | Admitting: *Deleted

## 2019-04-23 DIAGNOSIS — K219 Gastro-esophageal reflux disease without esophagitis: Secondary | ICD-10-CM

## 2019-04-23 MED ORDER — OMEPRAZOLE 40 MG PO CPDR: DELAYED_RELEASE_CAPSULE | ORAL | 3 refills | Status: DC

## 2019-04-24 ENCOUNTER — Other Ambulatory Visit: Payer: Self-pay

## 2019-04-24 DIAGNOSIS — F988 Other specified behavioral and emotional disorders with onset usually occurring in childhood and adolescence: Secondary | ICD-10-CM

## 2019-04-24 MED ORDER — AMPHETAMINE-DEXTROAMPHETAMINE 20 MG PO TABS: ORAL_TABLET | ORAL | 0 refills | Status: DC

## 2019-05-02 ENCOUNTER — Encounter: Payer: Self-pay | Admitting: Internal Medicine

## 2019-05-21 ENCOUNTER — Encounter: Payer: Self-pay | Admitting: Internal Medicine

## 2019-05-21 NOTE — Patient Instructions (Signed)

## 2019-05-21 NOTE — Progress Notes (Signed)
Annual  Screening/Preventative Visit  & Comprehensive Evaluation & Examination     This very nice 59 y.o. MWM  presents for a Screening /Preventative Visit & comprehensive evaluation and management of multiple medical co-morbidities.  Patient has been followed for HTN, HLD, Prediabetes/ Insulin Resistance, Testosterone Deficiency and Vitamin D Deficiency. Patient has GERD controlled on his diet & meds. He also is on CPAP for OSA with improved sleep hygiene.  Patient also has Inattentive and judiciously use Adderall for focus & concentration at work.      HTN predates since 2007. Patient's BP has been controlled at home.  Today's BP is at goal - 120/80. Patient denies any cardiac symptoms as chest pain, palpitations, shortness of breath, dizziness or ankle swelling.     Patient's hyperlipidemia is controlled with diet and Rosuvastatin. Patient denies myalgias or other medication SE's. Last lipids were at goal:  Lab Results  Component Value Date   CHOL 140 02/15/2019   HDL 42 02/15/2019   LDLCALC 78 02/15/2019   TRIG 116 02/15/2019   CHOLHDL 3.3 02/15/2019       Patient has hx/o prediabetes /Insulin Resistance(A1c 5.4% w/ Insulin 45 / 2015)  and patient denies reactive hypoglycemic symptoms, visual blurring, diabetic polys or paresthesias. Last A1c was Normal & at goal:  Lab Results  Component Value Date   HGBA1C 5.3 11/12/2018       Patient has hx/o Low Testosterone & is on depo-Testosterone  with improved stamina and sense of well being.       Finally, patient has history of Vitamin D Deficiency of    and last vitamin D was at goal:  Lab Results  Component Value Date   VD25OH 30 11/12/2018    Current Outpatient Medications on File Prior to Visit  Medication Sig  . amphetamine-dextroamphetamine (ADDERALL) 20 MG tablet Take 1/2 to 1 tablet 1 or 2 x /day for Focus & Concentration  . butalbital-acetaminophen-caffeine (FIORICET) 50-325-40 MG tablet TAKE 1 TO 2 TABLETS 4 TIMES A  DAY ONLY IF NEEDED FOR SEVERE HEADACHE  . fluticasone (FLONASE) 50 MCG/ACT nasal spray Place into both nostrils as needed.   Marland Kitchen KRILL OIL PO Take by mouth daily. Reported on 07/15/2015  . lisinopril (ZESTRIL) 20 MG tablet Take 1 tablet Daily for BP  . omeprazole (PRILOSEC) 40 MG capsule Take 1 capsule daily at Suppertime  for Acid Reflux  . OVER THE COUNTER MEDICATION Zyrtec OTC 1 tablet daily PRN.  . pregabalin (LYRICA) 75 MG capsule Take 1 capsule 2 x /day for pain (Patient taking differently: as needed. Take 1 capsule 2 x /day for pain)  . rosuvastatin (CRESTOR) 5 MG tablet TAKE 1 TABLET BY MOUTH  DAILY FOR CHOLESTEROL  . SYRINGE-NEEDLE, DISP, 3 ML (B-D 3CC LUER-LOK SYR 22GX1") 22G X 1" 3 ML MISC Checks sugar 1 daily for fluctuating sugars for prediabetes  . testosterone cypionate (DEPOTESTOSTERONE CYPIONATE) 200 MG/ML injection Inject 2 ml intramuscular every 14 days  . traMADol (ULTRAM) 50 MG tablet Take 1 tablet every 4 hours ONLY if needed for SEVERE pain   No current facility-administered medications on file prior to visit.   No Active Allergies   Past Medical History:  Diagnosis Date  . Allergy   . Hyperlipidemia   . Hypertension   . OSA (obstructive sleep apnea)    Health Maintenance  Topic Date Due  . COLONOSCOPY  03/21/2022  . TETANUS/TDAP  10/26/2023  . INFLUENZA VACCINE  Completed  . Hepatitis C Screening  Completed  . HIV Screening  Completed   Immunization History  Administered Date(s) Administered  . Influenza Inj Mdck Quad With Preservative 01/03/2018  . Influenza,inj,Quad PF,6+ Mos 01/30/2017, 02/10/2019  . Influenza,inj,quad, With Preservative 01/29/2016  . Influenza-Unspecified 01/30/2017  . PPD Test 10/25/2013, 12/22/2014, 01/29/2016, 04/13/2018, 05/22/2019  . Pneumococcal Polysaccharide-23 12/22/2014  . Tdap 10/25/2013   Last Colon - Dec 2013 - Dr Watt Climes - recc 5 yr f/u - overdue Dec 2018  Past Surgical History:  Procedure Laterality Date  .  APPENDECTOMY  1983  . HAND SURGERY Right 2007   Family History  Problem Relation Age of Onset  . Cancer Father        SCC- head and neck   Social History   Socioeconomic History  . Marital status: Married    Spouse name: Baldo Ash  . Number of children: Not on file  Occupational History  . Not on file  Tobacco Use  . Smoking status: Never Smoker  . Smokeless tobacco: Never Used  Substance and Sexual Activity  . Alcohol use: Yes    Alcohol/week: 2.0 standard drinks    Types: 2 Standard drinks or equivalent per week  . Drug use: No    ROS Constitutional: Denies fever, chills, weight loss/gain, headaches, insomnia,  night sweats or change in appetite. Does c/o fatigue. Eyes: Denies redness, blurred vision, diplopia, discharge, itchy or watery eyes.  ENT: Denies discharge, congestion, post nasal drip, epistaxis, sore throat, earache, hearing loss, dental pain, Tinnitus, Vertigo, Sinus pain or snoring.  Cardio: Denies chest pain, palpitations, irregular heartbeat, syncope, dyspnea, diaphoresis, orthopnea, PND, claudication or edema Respiratory: denies cough, dyspnea, DOE, pleurisy, hoarseness, laryngitis or wheezing.  Gastrointestinal: Denies dysphagia, heartburn, reflux, water brash, pain, cramps, nausea, vomiting, bloating, diarrhea, constipation, hematemesis, melena, hematochezia, jaundice or hemorrhoids Genitourinary: Denies dysuria, frequency, urgency, nocturia, hesitancy, discharge, hematuria or flank pain Musculoskeletal: Denies arthralgia, myalgia, stiffness, Jt. Swelling, pain, limp or strain/sprain. Denies Falls. Skin: Denies puritis, rash, hives, warts, acne, eczema or change in skin lesion Neuro: No weakness, tremor, incoordination, spasms, paresthesia or pain Psychiatric: Denies confusion, memory loss or sensory loss. Denies Depression. Endocrine: Denies change in weight, skin, hair change, nocturia, and paresthesia, diabetic polys, visual blurring or hyper / hypo  glycemic episodes.  Heme/Lymph: No excessive bleeding, bruising or enlarged lymph nodes.  Physical Exam  BP 120/80   Pulse 80   Temp (!) 97.2 F (36.2 C)   Resp 16   Ht 5\' 9"  (1.753 m)   Wt 187 lb 3.2 oz (84.9 kg)   BMI 27.64 kg/m   General Appearance: Well nourished and well groomed and in no apparent distress.  Eyes: PERRLA, EOMs, conjunctiva no swelling or erythema, normal fundi and vessels. Sinuses: No frontal/maxillary tenderness ENT/Mouth: EACs patent / TMs  nl. Nares clear without erythema, swelling, mucoid exudates. Oral hygiene is good. No erythema, swelling, or exudate. Tongue normal, non-obstructing. Tonsils not swollen or erythematous. Hearing normal.  Neck: Supple, thyroid not palpable. No bruits, nodes or JVD. Respiratory: Respiratory effort normal.  BS equal and clear bilateral without rales, rhonci, wheezing or stridor. Cardio: Heart sounds are normal with regular rate and rhythm and no murmurs, rubs or gallops. Peripheral pulses are normal and equal bilaterally without edema. No aortic or femoral bruits. Chest: symmetric with normal excursions and percussion.  Abdomen: Soft, with Nl bowel sounds. Nontender, no guarding, rebound, hernias, masses, or organomegaly.  Lymphatics: Non tender without lymphadenopathy.  Musculoskeletal: Full ROM all peripheral extremities, joint stability, 5/5 strength,  and normal gait. Skin: Warm and dry without rashes, lesions, cyanosis, clubbing or  ecchymosis.  Neuro: Cranial nerves intact, reflexes equal bilaterally. Normal muscle tone, no cerebellar symptoms. Sensation intact.  Pysch: Alert and oriented X 3 with normal affect, insight and judgment appropriate.   Assessment and Plan  1. Annual Preventative/Screening Exam    2. Essential hypertension  - [place], RETROPERITNL ABD,  LTD - Urinalysis, Routine w reflex microscopic - Microalbumin / creatinine urine ratio - CBC with Differential/Platelet - COMPLETE METABOLIC PANEL WITH  GFR - Magnesium - TSH - EKG 12-Lead  3. Hyperlipidemia, mixed  - [place], RETROPERITNL ABD,  LTD - Lipid panel - TSH - EKG 12-Lead  4. Vitamin D deficiency  - VITAMIN D 25 Hydroxy  5. Insulin resistance / Prediabetes  - [place], RETROPERITNL ABD,  LTD - Hemoglobin A1c - Insulin, random - EKG 12-Lead  6. 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 OSA on CPAP  - EKG 12-Lead  9. Gastroesophageal reflux disease    10. Screening examination for pulmonary tuberculosis  - TB Skin Test  11. Prostate cancer screening  - PSA  12. Screening for ischemic heart disease  - EKG 12-Lead  13. FH: hypertension  - [place], RETROPERITNL ABD,  LTD - EKG 12-Lead  14. Screening for AAA (aortic abdominal aneurysm)  - [place], RETROPERITNL ABD,  LTD  15. Fatigue, unspecified type  - Iron,Total/Total Iron Binding Cap - Vitamin B12 - CBC with Differential/Platelet - TSH  16. Medication management  - Urinalysis, Routine w reflex microscopic - Microalbumin / creatinine urine ratio - Testosterone - CBC with Differential/Platelet - COMPLETE METABOLIC PANEL WITH GFR - Magnesium - Lipid panel - TSH - Hemoglobin A1c - Insulin, random - VITAMIN D 25 Hydroxy          Patient was counseled in prudent diet, weight control to achieve/maintain BMI less than 25, BP monitoring, regular exercise and medications as discussed.  Discussed med effects and SE's. Routine screening labs and tests as requested with regular follow-up as recommended. Over 40 minutes of exam, counseling, chart review and high complex critical decision making was performed   Kirtland Bouchard,  MD  This note is not being shared with the patient for the following reason: To prevent harm (release of this note would result in harm to the life or physical safety of the patient or another).

## 2019-05-22 ENCOUNTER — Other Ambulatory Visit: Payer: Self-pay

## 2019-05-22 ENCOUNTER — Ambulatory Visit: Payer: 59 | Admitting: Internal Medicine

## 2019-05-22 ENCOUNTER — Other Ambulatory Visit: Payer: Self-pay | Admitting: Internal Medicine

## 2019-05-22 VITALS — BP 120/80 | HR 80 | Temp 97.2°F | Resp 16 | Ht 69.0 in | Wt 187.2 lb

## 2019-05-22 DIAGNOSIS — Z9989 Dependence on other enabling machines and devices: Secondary | ICD-10-CM

## 2019-05-22 DIAGNOSIS — E88819 Insulin resistance, unspecified: Secondary | ICD-10-CM

## 2019-05-22 DIAGNOSIS — E349 Endocrine disorder, unspecified: Secondary | ICD-10-CM

## 2019-05-22 DIAGNOSIS — Z8249 Family history of ischemic heart disease and other diseases of the circulatory system: Secondary | ICD-10-CM

## 2019-05-22 DIAGNOSIS — K219 Gastro-esophageal reflux disease without esophagitis: Secondary | ICD-10-CM

## 2019-05-22 DIAGNOSIS — R5383 Other fatigue: Secondary | ICD-10-CM

## 2019-05-22 DIAGNOSIS — E559 Vitamin D deficiency, unspecified: Secondary | ICD-10-CM

## 2019-05-22 DIAGNOSIS — I10 Essential (primary) hypertension: Secondary | ICD-10-CM

## 2019-05-22 DIAGNOSIS — Z125 Encounter for screening for malignant neoplasm of prostate: Secondary | ICD-10-CM

## 2019-05-22 DIAGNOSIS — Z79899 Other long term (current) drug therapy: Secondary | ICD-10-CM

## 2019-05-22 DIAGNOSIS — Z0001 Encounter for general adult medical examination with abnormal findings: Secondary | ICD-10-CM

## 2019-05-22 DIAGNOSIS — E782 Mixed hyperlipidemia: Secondary | ICD-10-CM

## 2019-05-22 DIAGNOSIS — F988 Other specified behavioral and emotional disorders with onset usually occurring in childhood and adolescence: Secondary | ICD-10-CM

## 2019-05-22 DIAGNOSIS — Z136 Encounter for screening for cardiovascular disorders: Secondary | ICD-10-CM | POA: Diagnosis not present

## 2019-05-22 DIAGNOSIS — Z0000 Encounter for general adult medical examination without abnormal findings: Secondary | ICD-10-CM | POA: Diagnosis not present

## 2019-05-22 DIAGNOSIS — E8881 Metabolic syndrome: Secondary | ICD-10-CM

## 2019-05-22 DIAGNOSIS — Z111 Encounter for screening for respiratory tuberculosis: Secondary | ICD-10-CM

## 2019-05-22 DIAGNOSIS — G4733 Obstructive sleep apnea (adult) (pediatric): Secondary | ICD-10-CM

## 2019-05-23 LAB — COMPLETE METABOLIC PANEL WITH GFR
AG Ratio: 1.7 (calc) (ref 1.0–2.5)
ALT: 30 U/L (ref 9–46)
AST: 23 U/L (ref 10–35)
Albumin: 4.3 g/dL (ref 3.6–5.1)
Alkaline phosphatase (APISO): 55 U/L (ref 35–144)
BUN: 12 mg/dL (ref 7–25)
CO2: 32 mmol/L (ref 20–32)
Calcium: 9.4 mg/dL (ref 8.6–10.3)
Chloride: 101 mmol/L (ref 98–110)
Creat: 1.08 mg/dL (ref 0.70–1.33)
GFR, Est African American: 87 mL/min/{1.73_m2} (ref >=60)
GFR, Est Non African American: 75 mL/min/{1.73_m2} (ref >=60)
Globulin: 2.6 g/dL (calc) (ref 1.9–3.7)
Glucose, Bld: 81 mg/dL (ref 65–99)
Potassium: 4.6 mmol/L (ref 3.5–5.3)
Sodium: 139 mmol/L (ref 135–146)
Total Bilirubin: 0.6 mg/dL (ref 0.2–1.2)
Total Protein: 6.9 g/dL (ref 6.1–8.1)

## 2019-05-23 LAB — CBC WITH DIFFERENTIAL/PLATELET
Absolute Monocytes: 421 cells/uL (ref 200–950)
Basophils Absolute: 67 cells/uL (ref 0–200)
Basophils Relative: 1.1 %
Eosinophils Absolute: 159 cells/uL (ref 15–500)
Eosinophils Relative: 2.6 %
HCT: 48.8 % (ref 38.5–50.0)
Hemoglobin: 15.8 g/dL (ref 13.2–17.1)
Lymphs Abs: 1232 cells/uL (ref 850–3900)
MCH: 26.1 pg — ABNORMAL LOW (ref 27.0–33.0)
MCHC: 32.4 g/dL (ref 32.0–36.0)
MCV: 80.5 fL (ref 80.0–100.0)
MPV: 9.5 fL (ref 7.5–12.5)
Monocytes Relative: 6.9 %
Neutro Abs: 4221 cells/uL (ref 1500–7800)
Neutrophils Relative %: 69.2 %
Platelets: 289 10*3/uL (ref 140–400)
RBC: 6.06 10*6/uL — ABNORMAL HIGH (ref 4.20–5.80)
RDW: 14.3 % (ref 11.0–15.0)
Total Lymphocyte: 20.2 %
WBC: 6.1 10*3/uL (ref 3.8–10.8)

## 2019-05-23 LAB — LIPID PANEL
Cholesterol: 145 mg/dL (ref <200)
HDL: 37 mg/dL — ABNORMAL LOW (ref >=40)
LDL Cholesterol (Calc): 86 mg/dL (calc)
Non-HDL Cholesterol (Calc): 108 mg/dL (calc) (ref <130)
Total CHOL/HDL Ratio: 3.9 (calc) (ref <5.0)
Triglycerides: 127 mg/dL (ref <150)

## 2019-05-23 LAB — IRON, TOTAL/TOTAL IRON BINDING CAP
%SAT: 19 % (calc) — ABNORMAL LOW (ref 20–48)
Iron: 64 ug/dL (ref 50–180)
TIBC: 335 mcg/dL (calc) (ref 250–425)

## 2019-05-23 LAB — URINALYSIS, ROUTINE W REFLEX MICROSCOPIC
Bilirubin Urine: NEGATIVE
Glucose, UA: NEGATIVE
Hgb urine dipstick: NEGATIVE
Ketones, ur: NEGATIVE
Leukocytes,Ua: NEGATIVE
Nitrite: NEGATIVE
Protein, ur: NEGATIVE
Specific Gravity, Urine: 1.012 (ref 1.001–1.03)
pH: 8 (ref 5.0–8.0)

## 2019-05-23 LAB — INSULIN, RANDOM: Insulin: 15.9 u[IU]/mL

## 2019-05-23 LAB — MAGNESIUM: Magnesium: 2.2 mg/dL (ref 1.5–2.5)

## 2019-05-23 LAB — TSH: TSH: 3.68 mIU/L (ref 0.40–4.50)

## 2019-05-23 LAB — MICROALBUMIN / CREATININE URINE RATIO
Creatinine, Urine: 75 mg/dL (ref 20–320)
Microalb Creat Ratio: 3 mcg/mg creat (ref <30)
Microalb, Ur: 0.2 mg/dL

## 2019-05-23 LAB — HEMOGLOBIN A1C
Hgb A1c MFr Bld: 5.4 % of total Hgb (ref <5.7)
Mean Plasma Glucose: 108 (calc)
eAG (mmol/L): 6 (calc)

## 2019-05-23 LAB — PSA: PSA: 4.1 ng/mL — ABNORMAL HIGH (ref <=4.0)

## 2019-05-23 LAB — VITAMIN D 25 HYDROXY (VIT D DEFICIENCY, FRACTURES): Vit D, 25-Hydroxy: 69 ng/mL (ref 30–100)

## 2019-05-23 LAB — VITAMIN B12: Vitamin B-12: 666 pg/mL (ref 200–1100)

## 2019-05-23 LAB — TESTOSTERONE: Testosterone: 855 ng/dL — ABNORMAL HIGH (ref 250–827)

## 2019-05-27 ENCOUNTER — Other Ambulatory Visit: Payer: Self-pay | Admitting: Internal Medicine

## 2019-05-27 LAB — TB SKIN TEST
Induration: 0 mm
TB Skin Test: NEGATIVE

## 2019-05-27 MED ORDER — PREDNISONE 20 MG PO TABS: ORAL_TABLET | ORAL | 1 refills | Status: DC

## 2019-06-14 ENCOUNTER — Other Ambulatory Visit: Payer: Self-pay

## 2019-06-14 DIAGNOSIS — F988 Other specified behavioral and emotional disorders with onset usually occurring in childhood and adolescence: Secondary | ICD-10-CM

## 2019-06-14 MED ORDER — TRAMADOL HCL 50 MG PO TABS: ORAL_TABLET | ORAL | 0 refills | Status: DC

## 2019-06-14 MED ORDER — AMPHETAMINE-DEXTROAMPHETAMINE 20 MG PO TABS: ORAL_TABLET | ORAL | 0 refills | Status: DC

## 2019-06-14 MED ORDER — BUTALBITAL-APAP-CAFFEINE 50-325-40 MG PO TABS: ORAL_TABLET | ORAL | 0 refills | Status: DC

## 2019-08-26 NOTE — Progress Notes (Deleted)
FOLLOW UP  Assessment and Plan:   Hypertension At goal; continue medications Monitor blood pressure at home; patient to call if consistently greater than 130/80 Continue DASH diet.   Reminder to go to the ER if any CP, SOB, nausea, dizziness, severe HA, changes vision/speech, left arm numbness and tingling and jaw pain.  Cholesterol Currently at LDL goal; continue rosuvastatin 5 mg daily Continue low cholesterol diet and exercise.  Check lipid panel.   Other abnormal glucose Recent A1Cs at goal Continue diet and exercise.  Perform daily foot/skin check, notify office of any concerning changes.  Defer A1C; check CMP  Overweight Long discussion about weight loss, diet, and exercise Recommended diet heavy in fruits and veggies and low in animal meats, cheeses, and dairy products, appropriate calorie intake Discussed ideal weight for height (165-170 lb) and initial weight goal (175lb) *** Patient will work on walking more, decreasing sugar/pasta Will follow up in 3 months  Vitamin D Def At goal at last visit; continue supplementation to maintain goal of 60-100 Defer Vit D level  ADD Continue medications: *** Helps with focus, no AE's. The patient was counseled on the addictive nature of the medication and was encouraged to take drug holidays when not needed.   Hypogonadism - continue to monitor, states medication is helping with symptoms of low T.   Continue diet and meds as discussed. Further disposition pending results of labs. Discussed med's effects and SE's.   Over 30 minutes of exam, counseling, chart review, and critical decision making was performed.   Future Appointments  Date Time Provider [place]  08/27/2019  8:45 AM Liane Comber, NP [facility] None  11/28/2019  9:30 AM [name], MD [facility] None  06/02/2020 11:00 AM [name], MD [facility] None     ----------------------------------------------------------------------------------------------------------------------  HPI 59 y.o. male  presents for 3 month follow up on hypertension, cholesterol, glucose management, weight, ADD, testosterone deficiency and vitamin D deficiency  Patient is on an ADD medication, *** he states that the medication is helping and he denies any adverse reactions.   BMI is There is no height or weight on file to calculate BMI., he has not been working on diet and exercise particularly, has been too busy with work and due to heat. He avoids fast food Wt Readings from Last 3 Encounters:  05/22/19 187 lb 3.2 oz (84.9 kg)  02/15/19 186 lb (84.4 kg)  11/12/18 185 lb 3.2 oz (84 kg)   His blood pressure has been controlled at home, today their BP is    He does workout. He denies chest pain, shortness of breath, dizziness.   He is on cholesterol medication (crestor 5 mg daily) and denies myalgias. His LDL cholesterol is not at goal; trigs remain elevated. The cholesterol last visit was:   Lab Results  Component Value Date   CHOL 145 05/22/2019   HDL 37 (L) 05/22/2019   LDLCALC 86 05/22/2019   TRIG 127 05/22/2019   CHOLHDL 3.9 05/22/2019    He has been working on diet and exercise for glucose management for hx of elevated A1C/insulin resistance, and denies foot ulcerations, increased appetite, nausea, paresthesia of the feet, polydipsia, polyuria, visual disturbances, vomiting and weight loss. Last A1C in the office was:  Lab Results  Component Value Date   HGBA1C 5.4 05/22/2019   Patient is on Vitamin D supplement and at goal:   Lab Results  Component Value Date   VD25OH 69 05/22/2019     He has a history  of testosterone deficiency and is on testosterone replacement, he is due for injection this weekend. *** He states that the testosterone helps with his energy, libido, muscle mass. Lab Results  Component Value Date   TESTOSTERONE 855 (H) 05/22/2019       Current Medications:  Current Outpatient Medications on File Prior to Visit  Medication Sig  . amphetamine-dextroamphetamine (ADDERALL) 20 MG tablet Take 1/2 to 1 tablet 1 or 2 x /day for Focus & Concentration  . butalbital-acetaminophen-caffeine (FIORICET) 50-325-40 MG tablet TAKE 1 TO 2 TABLETS 4 TIMES A DAY ONLY IF NEEDED FOR SEVERE HEADACHE  . fluticasone (FLONASE) 50 MCG/ACT nasal spray Place into both nostrils as needed.   Marland Kitchen KRILL OIL PO Take by mouth daily. Reported on 07/15/2015  . lisinopril (ZESTRIL) 20 MG tablet Take 1 tablet Daily for BP  . omeprazole (PRILOSEC) 40 MG capsule Take 1 capsule daily at Suppertime  for Acid Reflux  . OVER THE COUNTER MEDICATION Zyrtec OTC 1 tablet daily PRN.  Marland Kitchen predniSONE (DELTASONE) 20 MG tablet 1 tab 3 x day for 3 days, then 1 tab 2 x day for 3 days, then 1 tab 1 x day for 5 days  . pregabalin (LYRICA) 75 MG capsule Take 1 capsule 2 x /day for pain (Patient taking differently: as needed. Take 1 capsule 2 x /day for pain)  . rosuvastatin (CRESTOR) 5 MG tablet TAKE 1 TABLET BY MOUTH  DAILY FOR CHOLESTEROL  . SYRINGE-NEEDLE, DISP, 3 ML (B-D 3CC LUER-LOK SYR 22GX1") 22G X 1" 3 ML MISC Checks sugar 1 daily for fluctuating sugars for prediabetes  . testosterone cypionate (DEPOTESTOSTERONE CYPIONATE) 200 MG/ML injection Inject 2 ml intramuscular every 14 days  . traMADol (ULTRAM) 50 MG tablet Take 1 tablet every 4 hours ONLY if needed for SEVERE pain   No current facility-administered medications on file prior to visit.     Allergies:  No Active Allergies   Medical History:  Past Medical History:  Diagnosis Date  . Allergy   . Hyperlipidemia   . Hypertension   . OSA (obstructive sleep apnea)    Family history- Reviewed and unchanged Social history- Reviewed and unchanged   Review of Systems:  Review of Systems  Constitutional: Negative for malaise/fatigue and weight loss.  HENT: Negative for hearing loss and tinnitus.   Eyes:  Negative for blurred vision and double vision.  Respiratory: Negative for cough, shortness of breath and wheezing.   Cardiovascular: Negative for chest pain, palpitations, orthopnea, claudication and leg swelling.  Gastrointestinal: Negative for abdominal pain, blood in stool, constipation, diarrhea, heartburn, melena, nausea and vomiting.  Genitourinary: Negative.   Musculoskeletal: Negative for joint pain and myalgias.  Skin: Negative for rash.  Neurological: Negative for dizziness, tingling, sensory change, weakness and headaches.  Endo/Heme/Allergies: Negative for polydipsia.  Psychiatric/Behavioral: Negative.   All other systems reviewed and are negative.    Physical Exam: There were no vitals taken for this visit. Wt Readings from Last 3 Encounters:  05/22/19 187 lb 3.2 oz (84.9 kg)  02/15/19 186 lb (84.4 kg)  11/12/18 185 lb 3.2 oz (84 kg)   General Appearance: Well nourished, in no apparent distress. Eyes: PERRLA, EOMs, conjunctiva no swelling or erythema Sinuses: No Frontal/maxillary tenderness ENT/Mouth: Ext aud canals clear, TMs without erythema, bulging. No erythema, swelling, or exudate on post pharynx.  Tonsils not swollen or erythematous. Hearing normal.  Neck: Supple, thyroid normal.  Respiratory: Respiratory effort normal, BS equal bilaterally without rales, rhonchi, wheezing or stridor.  Cardio: RRR with no MRGs. Brisk peripheral pulses without edema.  Abdomen: Soft, + BS.  Non tender, no guarding, rebound, hernias, masses. Lymphatics: Non tender without lymphadenopathy.  Musculoskeletal: Full ROM, 5/5 strength, Normal gait Skin: Warm, dry without rashes, lesions, ecchymosis.  Neuro: Cranial nerves intact. No cerebellar symptoms.  Psych: Awake and oriented X 3, normal affect, Insight and Judgment appropriate.    Izora Ribas, NP 7:58 AM [facility]

## 2019-08-27 ENCOUNTER — Ambulatory Visit: Payer: 59 | Admitting: Adult Health

## 2019-09-17 ENCOUNTER — Other Ambulatory Visit: Payer: Self-pay

## 2019-09-17 DIAGNOSIS — F988 Other specified behavioral and emotional disorders with onset usually occurring in childhood and adolescence: Secondary | ICD-10-CM

## 2019-09-17 MED ORDER — AMPHETAMINE-DEXTROAMPHETAMINE 20 MG PO TABS: ORAL_TABLET | ORAL | 0 refills | Status: DC

## 2019-09-17 MED ORDER — TRAMADOL HCL 50 MG PO TABS: ORAL_TABLET | ORAL | 0 refills | Status: DC

## 2019-09-17 MED ORDER — BUTALBITAL-APAP-CAFFEINE 50-325-40 MG PO TABS: ORAL_TABLET | ORAL | 0 refills | Status: DC

## 2019-10-11 ENCOUNTER — Other Ambulatory Visit: Payer: Self-pay | Admitting: Internal Medicine

## 2019-10-11 DIAGNOSIS — E349 Endocrine disorder, unspecified: Secondary | ICD-10-CM

## 2019-10-11 MED ORDER — TESTOSTERONE CYPIONATE 200 MG/ML IM SOLN: INTRAMUSCULAR | 3 refills | Status: DC

## 2019-11-27 ENCOUNTER — Encounter: Payer: Self-pay | Admitting: Internal Medicine

## 2019-11-27 NOTE — Progress Notes (Signed)
History of Present Illness:      This very nice 59 y.o.  MWM presents for 6 month follow up with HTN, HLD, Pre-Diabetes and Vitamin D Deficiency.  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  Patient has GERD controlled with diet & his meds. Patient has OSA on CPAP with improved restorative sleep.       Patient is treated for HTN (2007) & BP has been controlled at home. Today's BP is at goal -  116/82. Patient has had no complaints of any cardiac type chest pain, palpitations, dyspnea / orthopnea / PND, dizziness, claudication, or dependent edema.      Hyperlipidemia is controlled with diet & meds. Patient denies myalgias or other med SE's. Last Lipids were at goal:  Lab Results  Component Value Date   CHOL 145 05/22/2019   HDL 37 (L) 05/22/2019   LDLCALC 86 05/22/2019   TRIG 127 05/22/2019   CHOLHDL 3.9 05/22/2019    Also, the patient has history of PreDiabetes / Insulin Resistance  (A1c 5.4% w/ Insulin 45 / 2015)resolved with weight loss & improved diet.  Patient has had no symptoms of reactive hypoglycemia, diabetic polys, paresthesias or visual blurring.  Last A1c was Normal & at goal:  Lab Results  Component Value Date   HGBA1C 5.4 05/22/2019           Patient has Testosterone deficiency and is on parenteral replacement therapy with improved sense of well being.       Further, the patient also has history of Vitamin D Deficiency and supplements vitamin D without any suspected side-effects. Last vitamin D was at goal:  Lab Results  Component Value Date   VD25OH 69 05/22/2019    Current Outpatient Medications on File Prior to Visit  Medication Sig  . amphetamine-dextroamphetamine (ADDERALL) 20 MG tablet Take 1/2 to 1 tablet 1 or 2 x /day for Focus & Concentration  . butalbital-acetaminophen-caffeine (FIORICET) 50-325-40 MG tablet TAKE 1 TO 2 TABLETS 4 TIMES A DAY ONLY IF NEEDED FOR SEVERE HEADACHE  . KRILL OIL PO Take by mouth daily.  Reported on 07/15/2015  . lisinopril (ZESTRIL) 20 MG tablet Take 1 tablet Daily for BP  . omeprazole (PRILOSEC) 40 MG capsule Take 1 capsule daily at Suppertime  for Acid Reflux  . OVER THE COUNTER MEDICATION Zyrtec OTC 1 tablet daily PRN.  . OVER THE COUNTER MEDICATION OTC Nasonex NS PRN  . pregabalin (LYRICA) 75 MG capsule Take 1 capsule 2 x /day for pain (Patient taking differently: as needed. Take 1 capsule 2 x /day for pain)  . rosuvastatin (CRESTOR) 5 MG tablet TAKE 1 TABLET BY MOUTH  DAILY FOR CHOLESTEROL  . SYRINGE-NEEDLE, DISP, 3 ML (B-D 3CC LUER-LOK SYR 22GX1") 22G X 1" 3 ML MISC Checks sugar 1 daily for fluctuating sugars for prediabetes  . testosterone cypionate (DEPOTESTOSTERONE CYPIONATE) 200 MG/ML injection Inject 2 ml intramuscular every 14 days  . traMADol (ULTRAM) 50 MG tablet Take 1 tablet every 4 hours ONLY if needed for SEVERE pain   No current facility-administered medications on file prior to visit.    No Active Allergies  PMHx:   Past Medical History:  Diagnosis Date  . Allergy   . Hyperlipidemia   . Hypertension   . OSA (obstructive sleep apnea)     Immunization History  Administered Date(s) Administered  . Influenza Inj Mdck Quad With Preservative 01/03/2018  . Influenza,inj,Quad PF,6+ Mos 01/30/2017,  02/10/2019  . Influenza,inj,quad, With Preservative 01/29/2016  . Influenza-Unspecified 01/30/2017  . PPD Test 10/25/2013, 12/22/2014, 01/29/2016, 04/13/2018, 05/22/2019  . Pneumococcal Polysaccharide-23 12/22/2014  . Tdap 10/25/2013    Past Surgical History:  Procedure Laterality Date  . APPENDECTOMY  1983  . HAND SURGERY Right 2007    FHx:    Reviewed / unchanged  SHx:    Reviewed / unchanged   Systems Review:  Constitutional: Denies fever, chills, wt changes, headaches, insomnia, fatigue, night sweats, change in appetite. Eyes: Denies redness, blurred vision, diplopia, discharge, itchy, watery eyes.  ENT: Denies discharge, congestion, post  nasal drip, epistaxis, sore throat, earache, hearing loss, dental pain, tinnitus, vertigo, sinus pain, snoring.  CV: Denies chest pain, palpitations, irregular heartbeat, syncope, dyspnea, diaphoresis, orthopnea, PND, claudication or edema. Respiratory: denies cough, dyspnea, DOE, pleurisy, hoarseness, laryngitis, wheezing.  Gastrointestinal: Denies dysphagia, odynophagia, heartburn, reflux, water brash, abdominal pain or cramps, nausea, vomiting, bloating, diarrhea, constipation, hematemesis, melena, hematochezia  or hemorrhoids. Genitourinary: Denies dysuria, frequency, urgency, nocturia, hesitancy, discharge, hematuria or flank pain. Musculoskeletal: Denies arthralgias, myalgias, stiffness, jt. swelling, pain, limping or strain/sprain.  Skin: Denies pruritus, rash, hives, warts, acne, eczema or change in skin lesion(s). Neuro: No weakness, tremor, incoordination, spasms, paresthesia or pain. Psychiatric: Denies confusion, memory loss or sensory loss. Endo: Denies change in weight, skin or hair change.  Heme/Lymph: No excessive bleeding, bruising or enlarged lymph nodes.  Physical Exam  BP 116/82   Pulse 72   Temp 97.6 F (36.4 C)   Resp 16   Ht 5\' 9"  (1.753 m)   Wt 187 lb 9.6 oz (85.1 kg)   BMI 27.70 kg/m   Appears  well nourished, well groomed  and in no distress.  Eyes: PERRLA, EOMs, conjunctiva no swelling or erythema. Sinuses: No frontal/maxillary tenderness ENT/Mouth: EAC's clear, TM's nl w/o erythema, bulging. Nares clear w/o erythema, swelling, exudates. Oropharynx clear without erythema or exudates. Oral hygiene is good. Tongue normal, non obstructing. Hearing intact.  Neck: Supple. Thyroid not palpable. Car 2+/2+ without bruits, nodes or JVD. Chest: Respirations nl with BS clear & equal w/o rales, rhonchi, wheezing or stridor.  Cor: Heart sounds normal w/ regular rate and rhythm without sig. murmurs, gallops, clicks or rubs. Peripheral pulses normal and equal  without  edema.  Abdomen: Soft & bowel sounds normal. Non-tender w/o guarding, rebound, hernias, masses or organomegaly.  Lymphatics: Unremarkable.  Musculoskeletal: Full ROM all peripheral extremities, joint stability, 5/5 strength and normal gait.  Skin: Warm, dry without exposed rashes, lesions or ecchymosis apparent.  Neuro: Cranial nerves intact, reflexes equal bilaterally. Sensory-motor testing grossly intact. Tendon reflexes grossly intact.  Pysch: Alert & oriented x 3.  Insight and judgement nl & appropriate. No ideations.  Assessment and Plan:  1. Essential hypertension  - Continue medication, monitor blood pressure at home.  - Continue DASH diet.  Reminder to go to the ER if any CP,  SOB, nausea, dizziness, severe HA, changes vision/speech.  - CBC with Differential/Platelet - COMPLETE METABOLIC PANEL WITH GFR - Magnesium - TSH  2. Hyperlipidemia, mixed  - Continue diet/meds, exercise,& lifestyle modifications.  - Continue monitor periodic cholesterol/liver & renal functions   - Lipid panel  3. Abnormal glucose  - Hemoglobin A1c - Insulin, random  4. Vitamin D deficiency  - Continue diet, exercise  - Lifestyle modifications.  - Monitor appropriate labs. - Continue supplementation.  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 OSA on CPAP  7. Testosterone deficiency  - Testosterone  8. Medication management  - CBC with Differential/Platelet - COMPLETE METABOLIC PANEL WITH GFR - Magnesium - Lipid panel - TSH - Hemoglobin A1c - Insulin, random - VITAMIN D 25 Hydroxy - Testosterone       Discussed  regular exercise, BP monitoring, weight control to achieve/maintain BMI less than 25 and discussed med and SE's. Recommended labs to assess and monitor clinical status with further disposition pending results of labs.  I discussed the assessment and treatment plan with the patient. Patient was encouraged to get the Covid  vaccine.  The patient was provided an opportunity to ask questions and all were answered. The patient agreed with the plan and demonstrated an understanding of the instructions.  I provided over 30 minutes of exam, counseling, chart review and  complex critical decision making.   Kirtland Bouchard, MD

## 2019-11-27 NOTE — Patient Instructions (Signed)

## 2019-11-28 ENCOUNTER — Other Ambulatory Visit: Payer: Self-pay

## 2019-11-28 ENCOUNTER — Ambulatory Visit: Payer: 59 | Admitting: Internal Medicine

## 2019-11-28 VITALS — BP 116/82 | HR 72 | Temp 97.6°F | Resp 16 | Ht 69.0 in | Wt 187.6 lb

## 2019-11-28 DIAGNOSIS — Z9989 Dependence on other enabling machines and devices: Secondary | ICD-10-CM

## 2019-11-28 DIAGNOSIS — E559 Vitamin D deficiency, unspecified: Secondary | ICD-10-CM | POA: Diagnosis not present

## 2019-11-28 DIAGNOSIS — E782 Mixed hyperlipidemia: Secondary | ICD-10-CM

## 2019-11-28 DIAGNOSIS — R7309 Other abnormal glucose: Secondary | ICD-10-CM | POA: Diagnosis not present

## 2019-11-28 DIAGNOSIS — Z79899 Other long term (current) drug therapy: Secondary | ICD-10-CM

## 2019-11-28 DIAGNOSIS — E349 Endocrine disorder, unspecified: Secondary | ICD-10-CM

## 2019-11-28 DIAGNOSIS — F988 Other specified behavioral and emotional disorders with onset usually occurring in childhood and adolescence: Secondary | ICD-10-CM

## 2019-11-28 DIAGNOSIS — I10 Essential (primary) hypertension: Secondary | ICD-10-CM | POA: Diagnosis not present

## 2019-11-28 DIAGNOSIS — G4733 Obstructive sleep apnea (adult) (pediatric): Secondary | ICD-10-CM

## 2019-11-29 LAB — CBC WITH DIFFERENTIAL/PLATELET
Absolute Monocytes: 480 cells/uL (ref 200–950)
Basophils Absolute: 58 cells/uL (ref 0–200)
Basophils Relative: 0.9 %
Eosinophils Absolute: 218 cells/uL (ref 15–500)
Eosinophils Relative: 3.4 %
HCT: 50.9 % — ABNORMAL HIGH (ref 38.5–50.0)
Hemoglobin: 16.6 g/dL (ref 13.2–17.1)
Lymphs Abs: 1389 cells/uL (ref 850–3900)
MCH: 26.9 pg — ABNORMAL LOW (ref 27.0–33.0)
MCHC: 32.6 g/dL (ref 32.0–36.0)
MCV: 82.6 fL (ref 80.0–100.0)
MPV: 9.3 fL (ref 7.5–12.5)
Monocytes Relative: 7.5 %
Neutro Abs: 4256 cells/uL (ref 1500–7800)
Neutrophils Relative %: 66.5 %
Platelets: 259 10*3/uL (ref 140–400)
RBC: 6.16 10*6/uL — ABNORMAL HIGH (ref 4.20–5.80)
RDW: 14.9 % (ref 11.0–15.0)
Total Lymphocyte: 21.7 %
WBC: 6.4 10*3/uL (ref 3.8–10.8)

## 2019-11-29 LAB — LIPID PANEL
Cholesterol: 195 mg/dL (ref <200)
HDL: 47 mg/dL (ref >=40)
LDL Cholesterol (Calc): 118 mg/dL (calc) — ABNORMAL HIGH
Non-HDL Cholesterol (Calc): 148 mg/dL (calc) — ABNORMAL HIGH (ref <130)
Total CHOL/HDL Ratio: 4.1 (calc) (ref <5.0)
Triglycerides: 186 mg/dL — ABNORMAL HIGH (ref <150)

## 2019-11-29 LAB — TESTOSTERONE: Testosterone: 672 ng/dL (ref 250–827)

## 2019-11-29 LAB — COMPLETE METABOLIC PANEL WITH GFR
AG Ratio: 1.7 (calc) (ref 1.0–2.5)
ALT: 76 U/L — ABNORMAL HIGH (ref 9–46)
AST: 47 U/L — ABNORMAL HIGH (ref 10–35)
Albumin: 4.5 g/dL (ref 3.6–5.1)
Alkaline phosphatase (APISO): 80 U/L (ref 35–144)
BUN: 14 mg/dL (ref 7–25)
CO2: 28 mmol/L (ref 20–32)
Calcium: 9.9 mg/dL (ref 8.6–10.3)
Chloride: 102 mmol/L (ref 98–110)
Creat: 0.98 mg/dL (ref 0.70–1.33)
GFR, Est African American: 98 mL/min/{1.73_m2} (ref >=60)
GFR, Est Non African American: 85 mL/min/{1.73_m2} (ref >=60)
Globulin: 2.7 g/dL (calc) (ref 1.9–3.7)
Glucose, Bld: 86 mg/dL (ref 65–99)
Potassium: 4.5 mmol/L (ref 3.5–5.3)
Sodium: 139 mmol/L (ref 135–146)
Total Bilirubin: 0.5 mg/dL (ref 0.2–1.2)
Total Protein: 7.2 g/dL (ref 6.1–8.1)

## 2019-11-29 LAB — HEMOGLOBIN A1C
Hgb A1c MFr Bld: 5.4 % of total Hgb (ref <5.7)
Mean Plasma Glucose: 108 (calc)
eAG (mmol/L): 6 (calc)

## 2019-11-29 LAB — TSH: TSH: 5.18 mIU/L — ABNORMAL HIGH (ref 0.40–4.50)

## 2019-11-29 LAB — MAGNESIUM: Magnesium: 2.2 mg/dL (ref 1.5–2.5)

## 2019-11-29 LAB — INSULIN, RANDOM: Insulin: 10.9 u[IU]/mL

## 2019-11-29 LAB — VITAMIN D 25 HYDROXY (VIT D DEFICIENCY, FRACTURES): Vit D, 25-Hydroxy: 75 ng/mL (ref 30–100)

## 2019-11-29 NOTE — Progress Notes (Signed)
====================================================  -    2  liver enzymes  (AST and ALT ) are slightly elevated ,   So Avoid excessive Alcohol ====================================================  -  Total Chol = 195 - slightly elevated   - and   - Bad  / Dangerous LDL Chol = 118 - elevated  (Ideal or Goal is less than 70  !  )   - So be sure taking your Rosuvastatin and   - Recommend a stricter low cholesterol diet   - Cholesterol only comes from animal sources  - ie. meat, dairy, egg yolks  - Eat all the vegetables you want.  - Avoid meat, especially red meat - Beef AND Pork .  - Avoid cheese & dairy - milk & ice cream.     - Cheese is the most concentrated form of trans-fats which  is the worst thing to clog up our arteries.   - Veggie cheese is OK which can be found in the fresh  produce section at [employer] or [employer] or [employer] ====================================================  -  A1c - Normal - great - No Diabetes ====================================================  -  Vitamin D = 75 - Excellent  ====================================================  -  Testosterone - 672 - Normal  ====================================================  -  All Else - CBC - Kidneys - Electrolytes - Liver - Magnesium &  Thyroid    - all  Normal / OK ====================================================

## 2019-11-30 ENCOUNTER — Encounter: Payer: Self-pay | Admitting: Internal Medicine

## 2020-02-27 NOTE — Progress Notes (Signed)
FOLLOW UP  Assessment and Plan:   Hypertension Monitor blood pressure at home; patient to call if consistently greater than 130/80 Continue DASH diet.   Reminder to go to the ER if any CP, SOB, nausea, dizziness, severe HA, changes vision/speech, left arm numbness and tingling and jaw pain.  Cholesterol Currently near LDL goal; continue rosuvastatin 5 mg daily, try to remember to take more, visual reminders LDL goal <100 Continue low cholesterol diet and exercise.  Check lipid panel.   Other abnormal glucose Recent A1Cs at goal Continue diet and exercise.  Perform daily foot/skin check, notify office of any concerning changes.  Defer A1C; check CMP  Overweight Long discussion about weight loss, diet, and exercise Recommended diet heavy in fruits and veggies and low in animal meats, cheeses, and dairy products, appropriate calorie intake Discussed ideal weight for height (165-170 lb) and initial weight goal (175lb) Patient will work on walking more, decreasing sugar/pasta Will follow up in 3 months  Vitamin D Def At goal at last visit; continue supplementation to maintain goal of 70-100 Defer Vit D level  Hypogonadism - continue to monitor, states medication is helping with symptoms of low T.   ADD Continue medications, uses occasionally Helps with focus, no AE's. The patient was counseled on the addictive nature of the medication and was encouraged to take drug holidays when not needed.   Elevated PSA Recheck PSA for trend today; refer to urology if persists 4+   Need for influenza vaccine Quadrivalent flu vaccine administered without complication today    Continue diet and meds as discussed. Further disposition pending results of labs. Discussed med's effects and SE's.   Over 30 minutes of exam, counseling, chart review, and critical decision making was performed.   Future Appointments  Date Time Provider [place]  06/02/2020 11:00 AM [name],  MD [id number] None    ----------------------------------------------------------------------------------------------------------------------  HPI 59 y.o. male  presents for 3 month follow up on hypertension, cholesterol, glucose management, weight and vitamin D deficiency.   He has OSA on CPAP - endorses 90+% of the time, sleep quality is variable, but some nights keeps him awake and can't tolerate  Patient is on an ADD medication, Takes only if he is on an extended drive (back and forth to [place] every other week, typically will take 1 tab), he states that the medication is helping and he denies any adverse reactions.   BMI is Body mass index is 28.8 kg/m., he has not been working on diet and exercise particularly. He avoids fast food. He thinks he could reduce breads and sweets. Wt Readings from Last 3 Encounters:  02/28/20 195 lb (88.5 kg)  11/28/19 187 lb 9.6 oz (85.1 kg)  05/22/19 187 lb 3.2 oz (84.9 kg)   His blood pressure has been controlled at home, today their BP is BP: 120/78  He does workout. He denies chest pain, shortness of breath, dizziness.   He is on cholesterol medication (crestor 5 mg daily, admits forgets to take, remembers about 75% of the time) and denies myalgias. His LDL cholesterol is not at goal. The cholesterol last visit was:   Lab Results  Component Value Date   CHOL 195 11/28/2019   HDL 47 11/28/2019   LDLCALC 118 (H) 11/28/2019   TRIG 186 (H) 11/28/2019   CHOLHDL 4.1 11/28/2019    He has been working on diet and exercise for glucose management for hx of elevated A1C/insulin resistance, and denies foot ulcerations, increased appetite, nausea, paresthesia  of the feet, polydipsia, polyuria, visual disturbances, vomiting and weight loss. Last A1C in the office was:  Lab Results  Component Value Date   HGBA1C 5.4 11/28/2019   Patient is on Vitamin D supplement and at goal:   Lab Results  Component Value Date   VD25OH 49 11/28/2019     He has a history  of testosterone deficiency and is on testosterone replacement, he is due for injection this weekend. He takes 200 mg weekly. Last was 6 days ago.  He states that the testosterone helps with his energy, libido, muscle mass. Lab Results  Component Value Date   TESTOSTERONE 672 11/28/2019   He had elevated PSA at last check. Endorses having some intermittent straining or slow stream on occasion, but not consistently.   Lab Results  Component Value Date   PSA 4.1 (H) 05/22/2019   PSA 2.8 04/13/2018   PSA 2.9 03/07/2017      Current Medications:  Current Outpatient Medications on File Prior to Visit  Medication Sig  . amphetamine-dextroamphetamine (ADDERALL) 20 MG tablet Take 1/2 to 1 tablet 1 or 2 x /day for Focus & Concentration  . butalbital-acetaminophen-caffeine (FIORICET) 50-325-40 MG tablet TAKE 1 TO 2 TABLETS 4 TIMES A DAY ONLY IF NEEDED FOR SEVERE HEADACHE  . KRILL OIL PO Take by mouth daily. Reported on 07/15/2015  . lisinopril (ZESTRIL) 20 MG tablet Take 1 tablet Daily for BP  . omeprazole (PRILOSEC) 40 MG capsule Take 1 capsule daily at Suppertime  for Acid Reflux  . OVER THE COUNTER MEDICATION Zyrtec OTC 1 tablet daily PRN.  . OVER THE COUNTER MEDICATION OTC Nasonex NS PRN  . pregabalin (LYRICA) 75 MG capsule Take 1 capsule 2 x /day for pain (Patient taking differently: as needed. Take 1 capsule 2 x /day for pain)  . rosuvastatin (CRESTOR) 5 MG tablet TAKE 1 TABLET BY MOUTH  DAILY FOR CHOLESTEROL  . SYRINGE-NEEDLE, DISP, 3 ML (B-D 3CC LUER-LOK SYR 22GX1") 22G X 1" 3 ML MISC Checks sugar 1 daily for fluctuating sugars for prediabetes  . testosterone cypionate (DEPOTESTOSTERONE CYPIONATE) 200 MG/ML injection Inject 2 ml intramuscular every 14 days  . traMADol (ULTRAM) 50 MG tablet Take 1 tablet every 4 hours ONLY if needed for SEVERE pain   No current facility-administered medications on file prior to visit.     Allergies:  No Active Allergies   Medical History:  Past  Medical History:  Diagnosis Date  . Allergy   . Hyperlipidemia   . Hypertension   . OSA (obstructive sleep apnea)    Family history- Reviewed and unchanged Social history- Reviewed and unchanged   Review of Systems:  Review of Systems  Constitutional: Negative for malaise/fatigue and weight loss.  HENT: Negative for hearing loss and tinnitus.   Eyes: Negative for blurred vision and double vision.  Respiratory: Negative for cough, shortness of breath and wheezing.   Cardiovascular: Negative for chest pain, palpitations, orthopnea, claudication and leg swelling.  Gastrointestinal: Negative for abdominal pain, blood in stool, constipation, diarrhea, heartburn, melena, nausea and vomiting.  Genitourinary: Negative.   Musculoskeletal: Negative for joint pain and myalgias.  Skin: Negative for rash.  Neurological: Negative for dizziness, tingling, sensory change, weakness and headaches.  Endo/Heme/Allergies: Negative for polydipsia.  Psychiatric/Behavioral: Negative.   All other systems reviewed and are negative.    Physical Exam: BP 120/78   Pulse 91   Temp 97.7 F (36.5 C)   Wt 195 lb (88.5 kg)   SpO2 95%  BMI 28.80 kg/m  Wt Readings from Last 3 Encounters:  02/28/20 195 lb (88.5 kg)  11/28/19 187 lb 9.6 oz (85.1 kg)  05/22/19 187 lb 3.2 oz (84.9 kg)   General Appearance: Well nourished, in no apparent distress. Eyes: PERRLA, EOMs, conjunctiva no swelling or erythema Sinuses: No Frontal/maxillary tenderness ENT/Mouth: Ext aud canals clear, TMs without erythema, bulging. No erythema, swelling, or exudate on post pharynx.  Tonsils not swollen or erythematous. Hearing normal.  Neck: Supple, thyroid normal.  Respiratory: Respiratory effort normal, BS equal bilaterally without rales, rhonchi, wheezing or stridor.  Cardio: RRR with no MRGs. Brisk peripheral pulses without edema.  Abdomen: Soft, + BS.  Non tender, no guarding, rebound, hernias, masses. Lymphatics: Non tender  without lymphadenopathy.  Musculoskeletal: Full ROM, 5/5 strength, Normal gait Skin: Warm, dry without rashes, lesions, ecchymosis.  Neuro: Cranial nerves intact. No cerebellar symptoms.  Psych: Awake and oriented X 3, normal affect, Insight and Judgment appropriate.    Oscar Ribas, NP 8:45 AM [facility]

## 2020-02-28 ENCOUNTER — Other Ambulatory Visit: Payer: Self-pay

## 2020-02-28 ENCOUNTER — Ambulatory Visit: Payer: 59 | Admitting: Adult Health

## 2020-02-28 ENCOUNTER — Encounter: Payer: Self-pay | Admitting: Adult Health

## 2020-02-28 VITALS — BP 120/78 | HR 91 | Temp 97.7°F | Wt 195.0 lb

## 2020-02-28 DIAGNOSIS — E663 Overweight: Secondary | ICD-10-CM

## 2020-02-28 DIAGNOSIS — Z23 Encounter for immunization: Secondary | ICD-10-CM

## 2020-02-28 DIAGNOSIS — E8881 Metabolic syndrome: Secondary | ICD-10-CM

## 2020-02-28 DIAGNOSIS — I10 Essential (primary) hypertension: Secondary | ICD-10-CM

## 2020-02-28 DIAGNOSIS — E782 Mixed hyperlipidemia: Secondary | ICD-10-CM | POA: Diagnosis not present

## 2020-02-28 DIAGNOSIS — G4733 Obstructive sleep apnea (adult) (pediatric): Secondary | ICD-10-CM

## 2020-02-28 DIAGNOSIS — E349 Endocrine disorder, unspecified: Secondary | ICD-10-CM

## 2020-02-28 DIAGNOSIS — E559 Vitamin D deficiency, unspecified: Secondary | ICD-10-CM

## 2020-02-28 DIAGNOSIS — F988 Other specified behavioral and emotional disorders with onset usually occurring in childhood and adolescence: Secondary | ICD-10-CM

## 2020-02-28 DIAGNOSIS — R972 Elevated prostate specific antigen [PSA]: Secondary | ICD-10-CM

## 2020-02-28 DIAGNOSIS — Z79899 Other long term (current) drug therapy: Secondary | ICD-10-CM

## 2020-02-28 NOTE — Addendum Note (Signed)
Addended by: Chancy Hurter on: 02/28/2020 09:07 AM   Modules accepted: Orders

## 2020-02-28 NOTE — Patient Instructions (Addendum)
Goals    . LDL CALC < 100    . Weight (lb) < 175 lb (79.4 kg)      Try increasing fiber in diet - helps with cholesterol (particularly soluble fiber) and also with weight loss    High-Fiber Diet Fiber, also called dietary fiber, is a type of carbohydrate that is found in fruits, vegetables, whole grains, and beans. A high-fiber diet can have many health benefits. Your health care provider may recommend a high-fiber diet to help:  Prevent constipation. Fiber can make your bowel movements more regular.  Lower your cholesterol.  Relieve the following conditions: ? Swelling of veins in the anus (hemorrhoids). ? Swelling and irritation (inflammation) of specific areas of the digestive tract (uncomplicated diverticulosis). ? A problem of the large intestine (colon) that sometimes causes pain and diarrhea (irritable bowel syndrome, IBS).  Prevent overeating as part of a weight-loss plan.  Prevent heart disease, type 2 diabetes, and certain cancers. What is my plan? The recommended daily fiber intake in grams (g) includes:  38 g for men age 45 or younger.  30 g for men over age 71.  47 g for women age 2 or younger.  21 g for women over age 7. You can get the recommended daily intake of dietary fiber by:  Eating a variety of fruits, vegetables, grains, and beans.  Taking a fiber supplement, if it is not possible to get enough fiber through your diet. What do I need to know about a high-fiber diet?  It is better to get fiber through food sources rather than from fiber supplements. There is not a lot of research about how effective supplements are.  Always check the fiber content on the nutrition facts label of any prepackaged food. Look for foods that contain 5 g of fiber or more per serving.  Talk with a diet and nutrition specialist (dietitian) if you have questions about specific foods that are recommended or not recommended for your medical condition, especially if those  foods are not listed below.  Gradually increase how much fiber you consume. If you increase your intake of dietary fiber too quickly, you may have bloating, cramping, or gas.  Drink plenty of water. Water helps you to digest fiber. What are tips for following this plan?  Eat a wide variety of high-fiber foods.  Make sure that half of the grains that you eat each day are whole grains.  Eat breads and cereals that are made with whole-grain flour instead of refined flour or white flour.  Eat brown rice, bulgur wheat, or millet instead of white rice.  Start the day with a breakfast that is high in fiber, such as a cereal that contains 5 g of fiber or more per serving.  Use beans in place of meat in soups, salads, and pasta dishes.  Eat high-fiber snacks, such as berries, raw vegetables, nuts, and popcorn.  Choose whole fruits and vegetables instead of processed forms like juice or sauce. What foods can I eat?  Fruits Berries. Pears. Apples. Oranges. Avocado. Prunes and raisins. Dried figs. Vegetables Sweet potatoes. Spinach. Kale. Artichokes. Cabbage. Broccoli. Cauliflower. Green peas. Carrots. Squash. Grains Whole-grain breads. Multigrain cereal. Oats and oatmeal. Brown rice. Barley. Bulgur wheat. [place]. Quinoa. Bran muffins. Popcorn. Rye wafer crackers. Meats and other proteins Navy, kidney, and pinto beans. Soybeans. Split peas. Lentils. Nuts and seeds. Dairy Fiber-fortified yogurt. Beverages Fiber-fortified soy milk. Fiber-fortified orange juice. Other foods Fiber bars. The items listed above may not be  a complete list of recommended foods and beverages. Contact a dietitian for more options. What foods are not recommended? Fruits Fruit juice. Cooked, strained fruit. Vegetables Fried potatoes. Canned vegetables. Well-cooked vegetables. Grains White bread. Pasta made with refined flour. White rice. Meats and other proteins Fatty cuts of meat. Fried chicken or fried  fish. Dairy Milk. Yogurt. Cream cheese. Sour cream. Fats and oils Butters. Beverages Soft drinks. Other foods Cakes and pastries. The items listed above may not be a complete list of foods and beverages to avoid. Contact a dietitian for more information. Summary  Fiber is a type of carbohydrate. It is found in fruits, vegetables, whole grains, and beans.  There are many health benefits of eating a high-fiber diet, such as preventing constipation, lowering blood cholesterol, helping with weight loss, and reducing your risk of heart disease, diabetes, and certain cancers.  Gradually increase your intake of fiber. Increasing too fast can result in cramping, bloating, and gas. Drink plenty of water while you increase your fiber.  The best sources of fiber include whole fruits and vegetables, whole grains, nuts, seeds, and beans. This information is not intended to replace advice given to you by your health care provider. Make sure you discuss any questions you have with your health care provider. Document Revised: 01/30/2017 Document Reviewed: 01/30/2017 Elsevier Patient Education  2020 [employer].      Prostate Cancer Screening  Prostate cancer screening is a test that is done to check for the presence of prostate cancer in men. The prostate gland is a walnut-sized gland that is located below the bladder and in front of the rectum in males. The function of the prostate is to add fluid to semen during ejaculation. Prostate cancer is the second most common type of cancer in men. Who should have prostate cancer screening?  Screening recommendations vary based on age and other risk factors. Screening is recommended if:  You are older than age 67. If you are age 54-69, talk with your health care provider about your need for screening and how often screening should be done. Because most prostate cancers are slow growing and will not cause death, screening is generally reserved in this age  group for men who have a 10-15-year life expectancy.  You are younger than age 53, and you have these risk factors: ? Being a black male or a male of African descent. ? Having a father, brother, or uncle who has been diagnosed with prostate cancer. The risk is higher if your family member's cancer occurred at an early age. Screening is not recommended if:  You are younger than age 23.  You are between the ages of 43 and 64 and you have no risk factors.  You are 79 years of age or older. At this age, the risks that screening can cause are greater than the benefits that it may provide. If you are at high risk for prostate cancer, your health care provider may recommend that you have screenings more often or that you start screening at a younger age. How is screening for prostate cancer done? The recommended prostate cancer screening test is a blood test called the prostate-specific antigen (PSA) test. PSA is a protein that is made in the prostate. As you age, your prostate naturally produces more PSA. Abnormally high PSA levels may be caused by:  Prostate cancer.  An enlarged prostate that is not caused by cancer (benign prostatic hyperplasia, BPH). This condition is very common in older men.  A prostate gland infection (prostatitis). Depending on the PSA results, you may need more tests, such as:  A physical exam to check the size of your prostate gland.  Blood and imaging tests.  A procedure to remove tissue samples from your prostate gland for testing (biopsy). What are the benefits of prostate cancer screening?  Screening can help to identify cancer at an early stage, before symptoms start and when the cancer can be treated more easily.  There is a small chance that screening may lower your risk of dying from prostate cancer. The chance is small because prostate cancer is a slow-growing cancer, and most men with prostate cancer die from a different cause. What are the risks of  prostate cancer screening? The main risk of prostate cancer screening is diagnosing and treating prostate cancer that would never have caused any symptoms or problems. This is called overdiagnosisand overtreatment. PSA screening cannot tell you if your PSA is high due to cancer or a different cause. A prostate biopsy is the only procedure to diagnose prostate cancer. Even the results of a biopsy may not tell you if your cancer needs to be treated. Slow-growing prostate cancer may not need any treatment other than monitoring, so diagnosing and treating it may cause unnecessary stress or other side effects. A prostate biopsy may also cause:  Infection or fever.  A false negative. This is a result that shows that you do not have prostate cancer when you actually do have prostate cancer. Questions to ask your health care provider  When should I start prostate cancer screening?  What is my risk for prostate cancer?  How often do I need screening?  What type of screening tests do I need?  How do I get my test results?  What do my results mean?  Do I need treatment? Where to find more information  The American Cancer Society: www.cancer.org  American Urological Association: www.auanet.org Contact a health care provider if:  You have difficulty urinating.  You have pain when you urinate or ejaculate.  You have blood in your urine or semen.  You have pain in your back or in the area of your prostate. Summary  Prostate cancer is a common type of cancer in men. The prostate gland is located below the bladder and in front of the rectum. This gland adds fluid to semen during ejaculation.  Prostate cancer screening may identify cancer at an early stage, when the cancer can be treated more easily.  The prostate-specific antigen (PSA) test is the recommended screening test for prostate cancer.  Discuss the risks and benefits of prostate cancer screening with your health care provider. If  you are age 77 or older, the risks that screening can cause are greater than the benefits that it may provide. This information is not intended to replace advice given to you by your health care provider. Make sure you discuss any questions you have with your health care provider. Document Revised: 11/08/2018 Document Reviewed: 11/08/2018 Elsevier Patient Education  [place].

## 2020-02-29 ENCOUNTER — Other Ambulatory Visit: Payer: Self-pay | Admitting: Adult Health

## 2020-02-29 DIAGNOSIS — R972 Elevated prostate specific antigen [PSA]: Secondary | ICD-10-CM

## 2020-02-29 LAB — COMPLETE METABOLIC PANEL WITH GFR
AG Ratio: 1.6 (calc) (ref 1.0–2.5)
ALT: 35 U/L (ref 9–46)
AST: 27 U/L (ref 10–35)
Albumin: 4.2 g/dL (ref 3.6–5.1)
Alkaline phosphatase (APISO): 52 U/L (ref 35–144)
BUN: 14 mg/dL (ref 7–25)
CO2: 28 mmol/L (ref 20–32)
Calcium: 9.5 mg/dL (ref 8.6–10.3)
Chloride: 103 mmol/L (ref 98–110)
Creat: 1.06 mg/dL (ref 0.70–1.33)
GFR, Est African American: 89 mL/min/{1.73_m2} (ref >=60)
GFR, Est Non African American: 77 mL/min/{1.73_m2} (ref >=60)
Globulin: 2.6 g/dL (calc) (ref 1.9–3.7)
Glucose, Bld: 89 mg/dL (ref 65–99)
Potassium: 4.1 mmol/L (ref 3.5–5.3)
Sodium: 140 mmol/L (ref 135–146)
Total Bilirubin: 0.6 mg/dL (ref 0.2–1.2)
Total Protein: 6.8 g/dL (ref 6.1–8.1)

## 2020-02-29 LAB — CBC WITH DIFFERENTIAL/PLATELET
Absolute Monocytes: 509 cells/uL (ref 200–950)
Basophils Absolute: 60 cells/uL (ref 0–200)
Basophils Relative: 0.9 %
Eosinophils Absolute: 221 cells/uL (ref 15–500)
Eosinophils Relative: 3.3 %
HCT: 46.7 % (ref 38.5–50.0)
Hemoglobin: 14.9 g/dL (ref 13.2–17.1)
Lymphs Abs: 1146 cells/uL (ref 850–3900)
MCH: 26.6 pg — ABNORMAL LOW (ref 27.0–33.0)
MCHC: 31.9 g/dL — ABNORMAL LOW (ref 32.0–36.0)
MCV: 83.2 fL (ref 80.0–100.0)
MPV: 9.6 fL (ref 7.5–12.5)
Monocytes Relative: 7.6 %
Neutro Abs: 4764 cells/uL (ref 1500–7800)
Neutrophils Relative %: 71.1 %
Platelets: 312 10*3/uL (ref 140–400)
RBC: 5.61 10*6/uL (ref 4.20–5.80)
RDW: 13.3 % (ref 11.0–15.0)
Total Lymphocyte: 17.1 %
WBC: 6.7 10*3/uL (ref 3.8–10.8)

## 2020-02-29 LAB — TSH: TSH: 3.98 mIU/L (ref 0.40–4.50)

## 2020-02-29 LAB — LIPID PANEL
Cholesterol: 150 mg/dL (ref <200)
HDL: 37 mg/dL — ABNORMAL LOW (ref >=40)
LDL Cholesterol (Calc): 85 mg/dL (calc)
Non-HDL Cholesterol (Calc): 113 mg/dL (calc) (ref <130)
Total CHOL/HDL Ratio: 4.1 (calc) (ref <5.0)
Triglycerides: 183 mg/dL — ABNORMAL HIGH (ref <150)

## 2020-02-29 LAB — PSA: PSA: 5.59 ng/mL — ABNORMAL HIGH (ref <=4.0)

## 2020-02-29 LAB — MAGNESIUM: Magnesium: 1.9 mg/dL (ref 1.5–2.5)

## 2020-04-17 ENCOUNTER — Other Ambulatory Visit: Payer: Self-pay

## 2020-04-17 MED ORDER — BUTALBITAL-APAP-CAFFEINE 50-325-40 MG PO TABS: ORAL_TABLET | ORAL | 0 refills | Status: DC

## 2020-04-17 MED ORDER — TRAMADOL HCL 50 MG PO TABS
ORAL_TABLET | ORAL | 0 refills | Status: DC
Start: 2020-04-17 — End: 2021-01-01

## 2020-04-18 ENCOUNTER — Other Ambulatory Visit: Payer: Self-pay | Admitting: Internal Medicine

## 2020-04-18 DIAGNOSIS — K219 Gastro-esophageal reflux disease without esophagitis: Secondary | ICD-10-CM

## 2020-04-20 ENCOUNTER — Other Ambulatory Visit: Payer: Self-pay | Admitting: Internal Medicine

## 2020-05-12 ENCOUNTER — Other Ambulatory Visit: Payer: Self-pay | Admitting: Internal Medicine

## 2020-05-12 MED ORDER — BENZONATATE 200 MG PO CAPS: ORAL_CAPSULE | ORAL | 1 refills | Status: DC

## 2020-06-01 ENCOUNTER — Encounter: Payer: Self-pay | Admitting: Internal Medicine

## 2020-06-01 NOTE — Patient Instructions (Signed)

## 2020-06-01 NOTE — Progress Notes (Signed)
Annual  Screening/Preventative Visit  & Comprehensive Evaluation & Examination      This very nice 60 y.o.  MWM presents for a Screening /Preventative Visit & comprehensive evaluation and management of multiple medical co-morbidities.  Patient has been followed for HTN, HLD, Testosterone Deficiency, Prediabetes and Vitamin D Deficiency. Patient on CPAP for OSA with improved Restorative sleep.  Patient has GERD controlled on Omeprazole. Patient is on Adderall for Adult ADD  with improved focus & concentration.         HTN predates scirca 2007. Patient's BP has been controlled with meds.  Today's BP is at goal - 124/86. Patient denies any cardiac symptoms as chest pain, palpitations, shortness of breath, dizziness or ankle swelling. Patient is reporting a persistent dry hacking tickle cough - felt likely ACEi cough).      Patient's hyperlipidemia is controlled with diet and Rosuvastatin. Patient denies myalgias or other medication SE's. Last lipids were at goal with slightly elevated Trig's:  Lab Results  Component Value Date   CHOL 150 02/28/2020   HDL 37 (L) 02/28/2020   LDLCALC 85 02/28/2020   TRIG 183 (H) 02/28/2020   CHOLHDL 4.1 02/28/2020       Patient has hx/o PreDiabetes & Insulin Resistance  (A1c 5.4%w/Insulin 45 /2015)resolved with improved diet & weight loss. Patient denies reactive hypoglycemic symptoms, visual blurring, diabetic polys or paresthesias. Last A1c was at goal:   Lab Results  Component Value Date   HGBA1C 5.4 11/28/2019       Patient has hx/o Low Testosterone and has been on parenteral replacement with improved stamina & sense of well-being.      Finally, patient has history of Vitamin D Deficiency  and last vitamin D was at goal:  Lab Results  Component Value Date   VD25OH 78 11/28/2019    Current Outpatient Medications on File Prior to Visit  Medication Sig  . ADDERALL20 MG tablet Take 1/2 to 1 tablet 1 or 2 x /day for Focus & Concentration  .  benzonatate 200 MG capsule Take 1 perle 3 x / day to prevent cough  . FIORICET  Take 1-2 Tabs  4 x /day only if needed   . lisinopril  20 MG tablet Take   1 tablet Daily   for BP     . omeprazole  40 MG capsule TAKE 1 CAPSULE DAILY   . Zyrtec OTC  1 tablet daily PRN.  . OTC Nasonex NS  PRN  . pregabalin  75 MG capsule Take 1 capsule 2 x /day for pain (Patient taking differently: as needed. Take 1 capsule 2 x /day for pain)  . rosuvastatin  5 MG tablet TAKE 1 TABLET BY MOUTH  DAILY FOR CHOLESTEROL  . testosterone cypio 200 MG/ML injec Inject 2 ml intramuscular every 14 days  . traMADol (ULTRAM) 50 MG tablet Take 1 tablet every 4 hours ONLY if needed      No Active Allergies  Past Medical History:  Diagnosis Date  . Allergy   . Hyperlipidemia   . Hypertension   . OSA (obstructive sleep apnea)    Health Maintenance  Topic Date Due  . COLONOSCOPY (Pts 45-74yrs Insurance coverage will need to be confirmed)  03/21/2022  . TETANUS/TDAP  10/26/2023  . INFLUENZA VACCINE  Completed  . Hepatitis C Screening  Completed  . HIV Screening  Completed    Last Colon - 03/21/2012 - Dr Lizbeth Bark - recc a 5 yr f/u - due Jan 2019.  Past Surgical History:  Procedure Laterality Date  . APPENDECTOMY  1983  . HAND SURGERY Right 2007   Family History  Problem Relation Age of Onset  . Cancer Father        SCC- head and neck   Social History   Socioeconomic History  . Marital status: Married    Spouse name: Baldo Ash  . Number of children: Not on file  Occupational History  . 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 Smoking status: Never Smoker  . Smokeless tobacco: Never Used  Substance and Sexual Activity  . Alcohol use: Yes    Alcohol/week: 2.0 standard drinks    Types: 2 Standard drinks or equivalent per week  . Drug use: No  . Sexual activity: Not on file    ROS Constitutional: Denies fever, chills, weight loss/gain, headaches, insomnia,  night sweats or change in  appetite. Does c/o fatigue. Eyes: Denies redness, blurred vision, diplopia, discharge, itchy or watery eyes.  ENT: Denies discharge, congestion, post nasal drip, epistaxis, sore throat, earache, hearing loss, dental pain, Tinnitus, Vertigo, Sinus pain or snoring.  Cardio: Denies chest pain, palpitations, irregular heartbeat, syncope, dyspnea, diaphoresis, orthopnea, PND, claudication or edema Respiratory: denies cough, dyspnea, DOE, pleurisy, hoarseness, laryngitis or wheezing.  Gastrointestinal: Denies dysphagia, heartburn, reflux, water brash, pain, cramps, nausea, vomiting, bloating, diarrhea, constipation, hematemesis, melena, hematochezia, jaundice or hemorrhoids Genitourinary: Denies dysuria, frequency,discharge, hematuria or flank pain. Has urgency, nocturia x 2-3 & occasional hesitancy. Musculoskeletal: Denies arthralgia, myalgia, stiffness, Jt. Swelling, pain, limp or strain/sprain. Denies Falls. Skin: Denies puritis, rash, hives, warts, acne, eczema or change in skin lesion Neuro: No weakness, tremor, incoordination, spasms, paresthesia or pain Psychiatric: Denies confusion, memory loss or sensory loss. Denies Depression. Endocrine: Denies change in weight, skin, hair change, nocturia, and paresthesia, diabetic polys, visual blurring or hyper / hypo glycemic episodes.  Heme/Lymph: No excessive bleeding, bruising or enlarged lymph nodes.  Physical Exam  BP 124/86   Pulse 81   Temp (!) 97 F (36.1 C)   Resp 16   Ht 5' 8.5" (1.74 m)   Wt 190 lb 12.8 oz (86.5 kg)   SpO2 96%   BMI 28.59 kg/m   General Appearance: Well nourished and well groomed and in no apparent distress.  Eyes: PERRLA, EOMs, conjunctiva no swelling or erythema, normal fundi and vessels. Sinuses: No frontal/maxillary tenderness ENT/Mouth: EACs patent / TMs  nl. Nares clear without erythema, swelling, mucoid exudates. Oral hygiene is good. No erythema, swelling, or exudate. Tongue normal, non-obstructing. Tonsils  not swollen or erythematous. Hearing normal.  Neck: Supple, thyroid not palpable. No bruits, nodes or JVD. Respiratory: Respiratory effort normal.  BS equal and clear bilateral without rales, rhonci, wheezing or stridor. Cardio: Heart sounds are normal with regular rate and rhythm and no murmurs, rubs or gallops. Peripheral pulses are normal and equal bilaterally without edema. No aortic or femoral bruits. Chest: symmetric with normal excursions and percussion.  Abdomen: Soft, with Nl bowel sounds. Nontender, no guarding, rebound, hernias, masses, or organomegaly.  Lymphatics: Non tender without lymphadenopathy.  Musculoskeletal: Full ROM all peripheral extremities, joint stability, 5/5 strength, and normal gait. Skin: Warm and dry without rashes, lesions, cyanosis, clubbing or  ecchymosis.  Neuro: Cranial nerves intact, reflexes equal bilaterally. Normal muscle tone, no cerebellar symptoms. Sensation intact.  Pysch: Alert and oriented X 3 with normal affect, insight and judgment appropriate.   Assessment and Plan  1. Annual Preventative/Screening Exam    2. Essential  hypertension  - D/C Lisinopril ( Allergic Cough)   - Rx Benicar 40 mg - 1/2-1 tab qd    - EKG 12-Lead - [place], RETROPERITNL ABD,  LTD - Urinalysis, Routine w reflex microscopic - Microalbumin / creatinine urine ratio - CBC with Differential/Platelet - COMPLETE METABOLIC PANEL WITH GFR - Magnesium - TSH  3. Hyperlipidemia, mixed  - EKG 12-Lead - [place], RETROPERITNL ABD,  LTD - Lipid panel - TSH  4. Abnormal glucose  - EKG 12-Lead - [place], RETROPERITNL ABD,  LTD - Hemoglobin A1c - Insulin, random  5. Vitamin D deficiency  - VITAMIN D 25 Hydroxy  6. OSA on CPAP   7. Testosterone deficiency  - Testosterone  8. 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 Prostate cancer screening  - PSA  11. Screening  examination for pulmonary tuberculosis  - TB Skin Test  12. Screening for colorectal cancer  - POC Hemoccult Bld/Stl   13. Screening for ischemic heart disease  - EKG 12-Lead  14. FH: hypertension  - EKG 12-Lead - [place], RETROPERITNL ABD,  LTD  15. Screening for AAA (aortic abdominal aneurysm)  - [place], RETROPERITNL ABD,  LTD  16. Fatigue, unspecified type  - Vitamin B12 - CBC with Differential/Platelet - TSH - Iron,Total/Total Iron Binding Cap  17. Medication management  - Urinalysis, Routine w reflex microscopic - Microalbumin / creatinine urine ratio - CBC with Differential/Platelet - COMPLETE METABOLIC PANEL WITH GFR - Magnesium - Lipid panel - TSH - Hemoglobin A1c - Insulin, random - VITAMIN D 25 Hydroxy - Testosterone           Patient was counseled in prudent diet, weight control to achieve/maintain BMI less than 25, BP monitoring, regular exercise and medications as discussed.  Discussed med effects and SE's. Routine screening labs and tests as requested with regular follow-up as recommended. Over 40 minutes of exam, counseling, chart review and high complex critical decision making was performed   Kirtland Bouchard, MD

## 2020-06-02 ENCOUNTER — Ambulatory Visit: Payer: 59 | Admitting: Internal Medicine

## 2020-06-02 ENCOUNTER — Other Ambulatory Visit: Payer: Self-pay

## 2020-06-02 VITALS — BP 124/86 | HR 81 | Temp 97.0°F | Resp 16 | Ht 68.5 in | Wt 190.8 lb

## 2020-06-02 DIAGNOSIS — R5383 Other fatigue: Secondary | ICD-10-CM

## 2020-06-02 DIAGNOSIS — E559 Vitamin D deficiency, unspecified: Secondary | ICD-10-CM

## 2020-06-02 DIAGNOSIS — F988 Other specified behavioral and emotional disorders with onset usually occurring in childhood and adolescence: Secondary | ICD-10-CM

## 2020-06-02 DIAGNOSIS — Z1211 Encounter for screening for malignant neoplasm of colon: Secondary | ICD-10-CM

## 2020-06-02 DIAGNOSIS — E782 Mixed hyperlipidemia: Secondary | ICD-10-CM

## 2020-06-02 DIAGNOSIS — I10 Essential (primary) hypertension: Secondary | ICD-10-CM | POA: Diagnosis not present

## 2020-06-02 DIAGNOSIS — Z79899 Other long term (current) drug therapy: Secondary | ICD-10-CM

## 2020-06-02 DIAGNOSIS — Z8249 Family history of ischemic heart disease and other diseases of the circulatory system: Secondary | ICD-10-CM | POA: Diagnosis not present

## 2020-06-02 DIAGNOSIS — E349 Endocrine disorder, unspecified: Secondary | ICD-10-CM

## 2020-06-02 DIAGNOSIS — G4733 Obstructive sleep apnea (adult) (pediatric): Secondary | ICD-10-CM

## 2020-06-02 DIAGNOSIS — E8881 Metabolic syndrome: Secondary | ICD-10-CM

## 2020-06-02 DIAGNOSIS — Z136 Encounter for screening for cardiovascular disorders: Secondary | ICD-10-CM | POA: Diagnosis not present

## 2020-06-02 DIAGNOSIS — Z111 Encounter for screening for respiratory tuberculosis: Secondary | ICD-10-CM | POA: Diagnosis not present

## 2020-06-02 DIAGNOSIS — R7309 Other abnormal glucose: Secondary | ICD-10-CM

## 2020-06-02 DIAGNOSIS — K219 Gastro-esophageal reflux disease without esophagitis: Secondary | ICD-10-CM

## 2020-06-02 DIAGNOSIS — Z0000 Encounter for general adult medical examination without abnormal findings: Secondary | ICD-10-CM | POA: Diagnosis not present

## 2020-06-02 DIAGNOSIS — Z1212 Encounter for screening for malignant neoplasm of rectum: Secondary | ICD-10-CM

## 2020-06-02 DIAGNOSIS — Z0001 Encounter for general adult medical examination with abnormal findings: Secondary | ICD-10-CM

## 2020-06-02 DIAGNOSIS — Z125 Encounter for screening for malignant neoplasm of prostate: Secondary | ICD-10-CM

## 2020-06-02 MED ORDER — OLMESARTAN MEDOXOMIL 40 MG PO TABS: ORAL_TABLET | ORAL | 0 refills | Status: DC

## 2020-06-03 LAB — MAGNESIUM: Magnesium: 2 mg/dL (ref 1.5–2.5)

## 2020-06-03 LAB — COMPLETE METABOLIC PANEL WITH GFR
AG Ratio: 1.6 (calc) (ref 1.0–2.5)
ALT: 33 U/L (ref 9–46)
AST: 25 U/L (ref 10–35)
Albumin: 4.4 g/dL (ref 3.6–5.1)
Alkaline phosphatase (APISO): 60 U/L (ref 35–144)
BUN: 12 mg/dL (ref 7–25)
CO2: 29 mmol/L (ref 20–32)
Calcium: 9.7 mg/dL (ref 8.6–10.3)
Chloride: 103 mmol/L (ref 98–110)
Creat: 1.02 mg/dL (ref 0.70–1.33)
GFR, Est African American: 93 mL/min/{1.73_m2} (ref >=60)
GFR, Est Non African American: 80 mL/min/{1.73_m2} (ref >=60)
Globulin: 2.7 g/dL (calc) (ref 1.9–3.7)
Glucose, Bld: 89 mg/dL (ref 65–99)
Potassium: 4.2 mmol/L (ref 3.5–5.3)
Sodium: 140 mmol/L (ref 135–146)
Total Bilirubin: 0.6 mg/dL (ref 0.2–1.2)
Total Protein: 7.1 g/dL (ref 6.1–8.1)

## 2020-06-03 LAB — CBC WITH DIFFERENTIAL/PLATELET
Absolute Monocytes: 364 cells/uL (ref 200–950)
Basophils Absolute: 50 cells/uL (ref 0–200)
Basophils Relative: 0.9 %
Eosinophils Absolute: 230 cells/uL (ref 15–500)
Eosinophils Relative: 4.1 %
HCT: 49.4 % (ref 38.5–50.0)
Hemoglobin: 16 g/dL (ref 13.2–17.1)
Lymphs Abs: 1243 cells/uL (ref 850–3900)
MCH: 25.8 pg — ABNORMAL LOW (ref 27.0–33.0)
MCHC: 32.4 g/dL (ref 32.0–36.0)
MCV: 79.5 fL — ABNORMAL LOW (ref 80.0–100.0)
MPV: 9.8 fL (ref 7.5–12.5)
Monocytes Relative: 6.5 %
Neutro Abs: 3713 cells/uL (ref 1500–7800)
Neutrophils Relative %: 66.3 %
Platelets: 278 10*3/uL (ref 140–400)
RBC: 6.21 10*6/uL — ABNORMAL HIGH (ref 4.20–5.80)
RDW: 18.1 % — ABNORMAL HIGH (ref 11.0–15.0)
Total Lymphocyte: 22.2 %
WBC: 5.6 10*3/uL (ref 3.8–10.8)

## 2020-06-03 LAB — URINALYSIS, ROUTINE W REFLEX MICROSCOPIC
Bacteria, UA: NONE SEEN /HPF
Bilirubin Urine: NEGATIVE
Glucose, UA: NEGATIVE
Hyaline Cast: NONE SEEN /LPF
Ketones, ur: NEGATIVE
Leukocytes,Ua: NEGATIVE
Nitrite: NEGATIVE
Protein, ur: NEGATIVE
Specific Gravity, Urine: 1.008 (ref 1.001–1.03)
Squamous Epithelial / HPF: NONE SEEN /HPF (ref <=5)
WBC, UA: NONE SEEN /HPF (ref 0–5)
pH: 6 (ref 5.0–8.0)

## 2020-06-03 LAB — TESTOSTERONE: Testosterone: 591 ng/dL (ref 250–827)

## 2020-06-03 LAB — VITAMIN B12: Vitamin B-12: 558 pg/mL (ref 200–1100)

## 2020-06-03 LAB — VITAMIN D 25 HYDROXY (VIT D DEFICIENCY, FRACTURES): Vit D, 25-Hydroxy: 71 ng/mL (ref 30–100)

## 2020-06-03 LAB — MICROALBUMIN / CREATININE URINE RATIO
Creatinine, Urine: 61 mg/dL (ref 20–320)
Microalb Creat Ratio: 3 mcg/mg creat (ref <30)
Microalb, Ur: 0.2 mg/dL

## 2020-06-03 LAB — IRON, TOTAL/TOTAL IRON BINDING CAP
%SAT: 15 % (calc) — ABNORMAL LOW (ref 20–48)
Iron: 54 ug/dL (ref 50–180)
TIBC: 359 mcg/dL (calc) (ref 250–425)

## 2020-06-03 LAB — LIPID PANEL
Cholesterol: 154 mg/dL
HDL: 39 mg/dL — ABNORMAL LOW
LDL Cholesterol (Calc): 88 mg/dL
Non-HDL Cholesterol (Calc): 115 mg/dL
Total CHOL/HDL Ratio: 3.9 (calc)
Triglycerides: 168 mg/dL — ABNORMAL HIGH

## 2020-06-03 LAB — HEMOGLOBIN A1C
Hgb A1c MFr Bld: 5.5 %{Hb}
Mean Plasma Glucose: 111 mg/dL
eAG (mmol/L): 6.2 mmol/L

## 2020-06-03 LAB — PSA: PSA: 6.11 ng/mL — ABNORMAL HIGH (ref <=4.0)

## 2020-06-03 LAB — TSH: TSH: 4.43 m[IU]/L (ref 0.40–4.50)

## 2020-06-03 LAB — INSULIN, RANDOM: Insulin: 22.1 u[IU]/mL — ABNORMAL HIGH

## 2020-06-03 NOTE — Progress Notes (Signed)
========================================================== -   Test results slightly outside the reference range are not unusual. If there is anything important, I will review this with you,  otherwise it is considered normal test values.  If you have further questions,  please do not hesitate to contact me at the office or via My Chart.  ========================================================== ==========================================================  -  Vitamin B12  Level -Normal   - Iron levels are low - probably due to the Omeprazole - which is a known  Side effect of omeprazole - So . . . . .   - Recommend take an OTC iron tablet   ========================================================== ==========================================================  -  PSA - still slightly elevated  ~ about the same - will continue to monitor ========================================================== ==========================================================  -  Total Chol = 154 and LDL Chol = 88 - Both  Excellent   - Very low risk for Heart Attack  / Stroke ========================================================  - A1c   Normal  - Great - No Diabetes!      ========================================================== ==========================================================  -  Vitamin D = 71 - Excellent  ========================================================== ==========================================================  -  Testosterone - Normal  ========================================================== ==========================================================  -  All Else - CBC - Kidneys - Electrolytes - Liver - Magnesium & Thyroid    - all  Normal / OK ===========================================================  =========================================================== -Keep up the [place] Work   =========================================================== ===========================================================  -

## 2020-06-04 LAB — TB SKIN TEST
Induration: 0 mm
TB Skin Test: NEGATIVE

## 2020-06-04 MED ORDER — OMEPRAZOLE 40 MG PO CPDR: DELAYED_RELEASE_CAPSULE | ORAL | 3 refills | Status: DC

## 2020-06-04 MED ORDER — TESTOSTERONE CYPIONATE 200 MG/ML IM SOLN
INTRAMUSCULAR | 3 refills | Status: DC
Start: 2020-06-04 — End: 2021-01-01

## 2020-06-04 NOTE — Addendum Note (Signed)
Addended by: [name] on: 06/04/2020 02:37 PM   Modules accepted: Orders

## 2020-06-26 ENCOUNTER — Other Ambulatory Visit: Payer: Self-pay | Admitting: Internal Medicine

## 2020-07-20 ENCOUNTER — Other Ambulatory Visit: Payer: Self-pay

## 2020-07-20 ENCOUNTER — Other Ambulatory Visit: Payer: Self-pay | Admitting: Internal Medicine

## 2020-07-20 DIAGNOSIS — F988 Other specified behavioral and emotional disorders with onset usually occurring in childhood and adolescence: Secondary | ICD-10-CM

## 2020-07-20 MED ORDER — PREGABALIN 75 MG PO CAPS: ORAL_CAPSULE | ORAL | 1 refills | Status: DC

## 2020-07-20 MED ORDER — AMPHETAMINE-DEXTROAMPHETAMINE 20 MG PO TABS: ORAL_TABLET | ORAL | 0 refills | Status: DC

## 2020-08-28 NOTE — Progress Notes (Deleted)
FOLLOW UP  Assessment and Plan:   Hypertension Monitor blood pressure at home; patient to call if consistently greater than 130/80 Continue DASH diet.   Reminder to go to the ER if any CP, SOB, nausea, dizziness, severe HA, changes vision/speech, left arm numbness and tingling and jaw pain.  Cholesterol Currently at LDL goal; continue rosuvastatin 5 mg daily, try to remember to take consistently LDL goal <100 Continue low cholesterol diet and exercise.  Check lipid panel.   Other abnormal glucose Recent A1Cs at goal Continue diet and exercise.  Perform daily foot/skin check, notify office of any concerning changes.  Defer A1C; check CMP  Overweight Long discussion about weight loss, diet, and exercise Recommended diet heavy in fruits and veggies and low in animal meats, cheeses, and dairy products, appropriate calorie intake Discussed ideal weight for height (165-170 lb) and initial weight goal (175lb) Patient will work on walking more, decreasing sugar/pasta *** Will follow up in 3 months  Vitamin D Def At goal at last visit; continue supplementation to maintain goal of 70-100 Defer Vit D level  Hypogonadism - continue to monitor, states medication is helping with symptoms of low T.   ADD Continue medications, uses occasionally Helps with focus, no AE's. The patient was counseled on the addictive nature of the medication and was encouraged to take drug holidays when not needed.     Continue diet and meds as discussed. Further disposition pending results of labs. Discussed med's effects and SE's.   Over 30 minutes of exam, counseling, chart review, and critical decision making was performed.   Future Appointments  Date Time Provider [place]  08/31/2020  9:30 AM Liane Comber, NP [facility] None  12/01/2020  9:30 AM [name], MD [facility] None  06/09/2021  2:00 PM [name], MD [facility] None     ----------------------------------------------------------------------------------------------------------------------  HPI 60 y.o. male  presents for 3 month follow up on hypertension, cholesterol, glucose management, weight and vitamin D deficiency.   He has OSA on CPAP - endorses 90+% of the time, sleep quality is variable, but some nights keeps him awake and can't tolerate  Patient is on an ADD medication, Takes only if he is on an extended drive (back and forth to [place] every other week, typically will take 1 tab), he states that the medication is helping and he denies any adverse reactions.   BMI is There is no height or weight on file to calculate BMI., he has not been working on diet and exercise particularly. He avoids fast food. He thinks he could reduce breads and sweets. Wt Readings from Last 3 Encounters:  06/02/20 190 lb 12.8 oz (86.5 kg)  02/28/20 195 lb (88.5 kg)  11/28/19 187 lb 9.6 oz (85.1 kg)   His blood pressure has been controlled at home, today their BP is    He does workout. He denies chest pain, shortness of breath, dizziness.   He is on cholesterol medication (crestor 5 mg daily, admits forgets to take, remembers about 75% of the time) and denies myalgias. His LDL cholesterol is not at goal. The cholesterol last visit was:   Lab Results  Component Value Date   CHOL 154 06/02/2020   HDL 39 (L) 06/02/2020   LDLCALC 88 06/02/2020   TRIG 168 (H) 06/02/2020   CHOLHDL 3.9 06/02/2020    He has been working on diet and exercise for glucose management for hx of elevated A1C/insulin resistance, and denies foot ulcerations, increased appetite, nausea, paresthesia of the  feet, polydipsia, polyuria, visual disturbances, vomiting and weight loss. Last A1C in the office was:  Lab Results  Component Value Date   HGBA1C 5.5 06/02/2020   Patient is on Vitamin D supplement and at goal:   Lab Results  Component Value Date   VD25OH 71 06/02/2020     He has a history of  testosterone deficiency and is on testosterone replacement, he is due for injection this weekend. He takes 200 mg weekly. Last was 6 days ago.  He states that the testosterone helps with his energy, libido, muscle mass. Lab Results  Component Value Date   TESTOSTERONE 591 06/02/2020   He had elevated PSAs trending up, was referred to Dr. Gloriann Loan at [facility] and had benign biopsy early 2022. They plan to follow q71m for PSA monitoring. Endorses having some intermittent straining or slow stream on occasion, but not consistently.   Lab Results  Component Value Date   PSA 6.11 (H) 06/02/2020   PSA 5.59 (H) 02/28/2020   PSA 4.1 (H) 05/22/2019      Current Medications:  Current Outpatient Medications on File Prior to Visit  Medication Sig  . amphetamine-dextroamphetamine (ADDERALL) 20 MG tablet Take 1/2 to 1 tablet 1 or 2 x /day for Focus & Concentration  . benzonatate (TESSALON) 200 MG capsule Take 1 perle 3 x / day to prevent cough  . butalbital-acetaminophen-caffeine (FIORICET) 50-325-40 MG tablet TAKE 1 TO 2 TABLETS 4 TIMES A DAY ONLY IF NEEDED FOR SEVERE HEADACHE  . KRILL OIL PO Take by mouth daily. Reported on 07/15/2015 (Patient not taking: Reported on 06/02/2020)  . olmesartan (BENICAR) 40 MG tablet Take  1/2 to 1  Tablet  Daily  for BP  . omeprazole (PRILOSEC) 40 MG capsule TAKE 1 CAPSULE BY MOUTH DAILY AT SUPPER TIME FOR ACID REFLUX  . OVER THE COUNTER MEDICATION Zyrtec OTC 1 tablet daily PRN.  . OVER THE COUNTER MEDICATION OTC Nasonex NS PRN  . pregabalin (LYRICA) 75 MG capsule Take 1 capsule 2 x /day for pain  . rosuvastatin (CRESTOR) 5 MG tablet TAKE 1 TABLET BY MOUTH  DAILY FOR CHOLESTEROL  . SYRINGE-NEEDLE, DISP, 3 ML (B-D 3CC LUER-LOK SYR 22GX1") 22G X 1" 3 ML MISC Checks sugar 1 daily for fluctuating sugars for prediabetes  . testosterone cypionate (DEPOTESTOSTERONE CYPIONATE) 200 MG/ML injection Inject 2 ml intramuscular every 14 days  . traMADol (ULTRAM) 50 MG tablet Take  1 tablet every 4 hours ONLY if needed for SEVERE pain   No current facility-administered medications on file prior to visit.     Allergies:  No Known Allergies   Medical History:  Past Medical History:  Diagnosis Date  . Allergy   . Hyperlipidemia   . Hypertension   . OSA (obstructive sleep apnea)    Family history- Reviewed and unchanged Social history- Reviewed and unchanged   Review of Systems:  Review of Systems  Constitutional: Negative for malaise/fatigue and weight loss.  HENT: Negative for hearing loss and tinnitus.   Eyes: Negative for blurred vision and double vision.  Respiratory: Negative for cough, shortness of breath and wheezing.   Cardiovascular: Negative for chest pain, palpitations, orthopnea, claudication and leg swelling.  Gastrointestinal: Negative for abdominal pain, blood in stool, constipation, diarrhea, heartburn, melena, nausea and vomiting.  Genitourinary: Negative.   Musculoskeletal: Negative for joint pain and myalgias.  Skin: Negative for rash.  Neurological: Negative for dizziness, tingling, sensory change, weakness and headaches.  Endo/Heme/Allergies: Negative for polydipsia.  Psychiatric/Behavioral: Negative.  All other systems reviewed and are negative.    Physical Exam: There were no vitals taken for this visit. Wt Readings from Last 3 Encounters:  06/02/20 190 lb 12.8 oz (86.5 kg)  02/28/20 195 lb (88.5 kg)  11/28/19 187 lb 9.6 oz (85.1 kg)   General Appearance: Well nourished, in no apparent distress. Eyes: PERRLA, EOMs, conjunctiva no swelling or erythema Sinuses: No Frontal/maxillary tenderness ENT/Mouth: Ext aud canals clear, TMs without erythema, bulging. No erythema, swelling, or exudate on post pharynx.  Tonsils not swollen or erythematous. Hearing normal.  Neck: Supple, thyroid normal.  Respiratory: Respiratory effort normal, BS equal bilaterally without rales, rhonchi, wheezing or stridor.  Cardio: RRR with no MRGs.  Brisk peripheral pulses without edema.  Abdomen: Soft, + BS.  Non tender, no guarding, rebound, hernias, masses. Lymphatics: Non tender without lymphadenopathy.  Musculoskeletal: Full ROM, 5/5 strength, Normal gait Skin: Warm, dry without rashes, lesions, ecchymosis.  Neuro: Cranial nerves intact. No cerebellar symptoms.  Psych: Awake and oriented X 3, normal affect, Insight and Judgment appropriate.    Izora Ribas, NP 8:22 AM [facility]

## 2020-08-31 ENCOUNTER — Ambulatory Visit: Payer: 59 | Admitting: Adult Health

## 2020-09-25 ENCOUNTER — Other Ambulatory Visit: Payer: Self-pay | Admitting: Internal Medicine

## 2020-09-25 MED ORDER — DEXAMETHASONE 4 MG PO TABS: ORAL_TABLET | ORAL | 0 refills | Status: DC

## 2020-10-23 ENCOUNTER — Other Ambulatory Visit: Payer: Self-pay

## 2020-10-23 MED ORDER — BUTALBITAL-APAP-CAFFEINE 50-325-40 MG PO TABS: ORAL_TABLET | ORAL | 0 refills | Status: DC

## 2020-10-26 ENCOUNTER — Other Ambulatory Visit: Payer: Self-pay | Admitting: Nurse Practitioner

## 2020-10-26 DIAGNOSIS — E782 Mixed hyperlipidemia: Secondary | ICD-10-CM

## 2020-10-26 MED ORDER — ROSUVASTATIN CALCIUM 5 MG PO TABS: ORAL_TABLET | ORAL | 1 refills | Status: DC

## 2020-11-02 ENCOUNTER — Other Ambulatory Visit: Payer: Self-pay

## 2020-11-02 DIAGNOSIS — I10 Essential (primary) hypertension: Secondary | ICD-10-CM

## 2020-11-02 MED ORDER — OLMESARTAN MEDOXOMIL 40 MG PO TABS: ORAL_TABLET | ORAL | 3 refills | Status: DC

## 2020-11-09 ENCOUNTER — Other Ambulatory Visit: Payer: Self-pay

## 2020-11-09 ENCOUNTER — Other Ambulatory Visit: Payer: Self-pay | Admitting: Internal Medicine

## 2020-11-09 MED ORDER — DEXAMETHASONE 4 MG PO TABS: ORAL_TABLET | ORAL | 0 refills | Status: DC

## 2020-11-30 ENCOUNTER — Encounter: Payer: Self-pay | Admitting: Internal Medicine

## 2020-11-30 NOTE — Progress Notes (Signed)
Future Appointments  Date Time Provider [place]  12/01/2020  9:30 AM [name], MD [facility] None  06/09/2021  2:00 PM [name], MD [facility] None    History of Present Illness:       This very nice 60 y.o. MWM presents for 6 month follow up with HTN, HLD, Pre-Diabetes and Vitamin D Deficiency.  Patient's GERD is controlled with his meds. Patient is on CPAP for OSA with improved restorative sleep. Patient also has Adult ADD  is on Adderall for with improved focus & concentration.       Patient has hx/o HNP surg in 2017 at the [place] in [facility] by a Dr Mariel Kansky. Intermittently he has had recurrent LBP with Rt sciatica requiring EDSI. He reports recently more frequent intense & disabling Rt sciatica pains. Is on Lyrica /Pregablin 75 mg taking 1 - 2 caps 2 x /day.         Patient is treated for HTN  (2007) & BP has been controlled at home. Today's BP is at goal  - 138/84. Patient has had no complaints of any cardiac type chest pain, palpitations, dyspnea Vertell Limber /PND, dizziness, claudication, or dependent edema.       Hyperlipidemia is controlled with diet & meds. Patient denies myalgias or other med SE's. Last Lipids were at goal except elevated Trig's:  Lab Results  Component Value Date   CHOL 154 06/02/2020   HDL 39 (L) 06/02/2020   LDLCALC 88 06/02/2020   TRIG 168 (H) 06/02/2020   CHOLHDL 3.9 06/02/2020     Also, the patient has history of PreDiabetes /Insulin Resistance  (A1c 5.4% w/Insulin 45 /2015) resolved with improved diet & weight loss.  Patient has had no symptoms of reactive hypoglycemia, diabetic polys, paresthesias or visual blurring.  Last A1c was at goal:  Lab Results  Component Value Date   HGBA1C 5.5 06/02/2020                                              Patient has Testosterone  Deficiency  (level "166" /2015) and has been on parenteral replacement with improved stamina & sense of well-being.                                                       Further, the patient also has history of Vitamin D Deficiency  ("30"/2015) and supplements vitamin D without any suspected side-effects. Last vitamin D was at goal:   Lab Results  Component Value Date   VD25OH 71 06/02/2020     Current Outpatient Medications on File Prior to Visit  Medication Sig   ADDERALL) 20 MG tablet Take 1/2 to 1 tablet 1 or 2 x /day for Focus & Concentration   FIORICET TAKE 1 TO 2 TABLETS 4 TIMES A DAY ONLY IF NEEDED FOR SEVERE HEADACHE   olmesartan (BENICAR) 40 MG tablet Take  1/2 to 1  Tablet  Daily  for BP   omeprazole (PRILOSEC) 40 MG capsule TAKE 1 CAPSULE DAILY    Zyrtec OTC  1 tablet daily PRN.   OTC Nasonex  NS PRN   pregabalin (LYRICA) 75 MG capsule Take 1  capsule 2 x /day for pain   rosuvastatin (CRESTOR) 5 MG tablet TAKE 1 TABLET  DAILY FOR CHOLESTEROL   testosterone cypio 200 MG/ML injection Inject 2 ml intramuscular every 14 days   traMADol (ULTRAM) 50 MG tablet Take 1 tablet every 4 hours ONLY if needed for SEVERE pain   No Known Allergies  PMHx:   Past Medical History:  Diagnosis Date   Allergy    Hyperlipidemia    Hypertension    OSA (obstructive sleep apnea)      Immunization History  Administered Date(s) Administered   Influenza Inj Mdck Quad  01/03/2018, 02/28/2020   Influenza,inj,Quad  01/30/2017, 02/10/2019   Influenza,inj,quad 01/29/2016   Influenza-Unspecified 01/30/2017   PPD Test 04/13/2018, 05/22/2019, 06/02/2020   Pneumococcal -23 12/22/2014   Tdap 10/25/2013     Past Surgical History:  Procedure Laterality Date   APPENDECTOMY  1983   HAND SURGERY Right 2007    FHx:    Reviewed / unchanged  SHx:    Reviewed / unchanged   Systems Review:  Constitutional: Denies fever, chills, wt changes, headaches, insomnia, fatigue, night sweats, change in appetite. Eyes: Denies redness, blurred vision, diplopia, discharge, itchy, watery eyes.  ENT: Denies discharge, congestion, post nasal drip,  epistaxis, sore throat, earache, hearing loss, dental pain, tinnitus, vertigo, sinus pain, snoring.  CV: Denies chest pain, palpitations, irregular heartbeat, syncope, dyspnea, diaphoresis, orthopnea, PND, claudication or edema. Respiratory: denies cough, dyspnea, DOE, pleurisy, hoarseness, laryngitis, wheezing.  Gastrointestinal: Denies dysphagia, odynophagia, heartburn, reflux, water brash, abdominal pain or cramps, nausea, vomiting, bloating, diarrhea, constipation, hematemesis, melena, hematochezia  or hemorrhoids. Genitourinary: Denies dysuria, frequency, urgency, nocturia, hesitancy, discharge, hematuria or flank pain. Musculoskeletal: Denies arthralgias, myalgias, stiffness, jt. swelling, pain, limping or strain/sprain.  Skin: Denies pruritus, rash, hives, warts, acne, eczema or change in skin lesion(s). Neuro: No weakness, tremor, incoordination, spasms, paresthesia or pain. Psychiatric: Denies confusion, memory loss or sensory loss. Endo: Denies change in weight, skin or hair change.  Heme/Lymph: No excessive bleeding, bruising or enlarged lymph nodes.  Physical Exam  BP 138/84   Pulse 82   Temp 97.8 F (36.6 C)   Resp 16   Ht 5' 8.5" (1.74 m)   Wt 190 lb 6.4 oz (86.4 kg)   SpO2 98%   BMI 28.53 kg/m   Appears  well nourished, well groomed  and in no distress.  Eyes: PERRLA, EOMs, conjunctiva no swelling or erythema. Sinuses: No frontal/maxillary tenderness ENT/Mouth: EAC's clear, TM's nl w/o erythema, bulging. Nares clear w/o erythema, swelling, exudates. Oropharynx clear without erythema or exudates. Oral hygiene is good. Tongue normal, non obstructing. Hearing intact.  Neck: Supple. Thyroid not palpable. Car 2+/2+ without bruits, nodes or JVD. Chest: Respirations nl with BS clear & equal w/o rales, rhonchi, wheezing or stridor.  Cor: Heart sounds normal w/ regular rate and rhythm without sig. murmurs, gallops, clicks or rubs. Peripheral pulses normal and equal  without  edema.  Abdomen: Soft & bowel sounds normal. Non-tender w/o guarding, rebound, hernias, masses or organomegaly.  Lymphatics: Unremarkable.  Musculoskeletal: Full ROM all peripheral extremities, joint stability, 5/5 strength and normal gait.  Skin: Warm, dry without exposed rashes, lesions or ecchymosis apparent.  Neuro: Cranial nerves intact, reflexes equal bilaterally. Sensory-motor testing grossly intact. Tendon reflexes grossly intact.  Pysch: Alert & oriented x 3.  Insight and judgement nl & appropriate. No ideations.  Assessment and Plan:  1. Essential hypertension  - Continue medication, monitor blood pressure at home.  - Continue DASH  diet.  Reminder to go to the ER if any CP,  SOB, nausea, dizziness, severe HA, changes vision/speech.    - CBC with Differential/Platelet - COMPLETE METABOLIC PANEL WITH GFR - Magnesium - TSH  2. Hyperlipidemia, mixed  - Continue diet/meds, exercise,& lifestyle modifications.  - Continue monitor periodic cholesterol/liver & renal functions     - Lipid panel - TSH  3. Abnormal glucose  - Continue diet, exercise  - Lifestyle modifications.  - Monitor appropriate labs   - Hemoglobin A1c - Insulin, random  4. Vitamin D deficiency  - Continue supplementation    5. Testosterone deficiency  - Testosterone  6. OSA on CPAP   7. Low Back Pain with Rt Sciatica  - Lumbar MRI to r/o spinal stenosis  8. Medication management  - CBC with Differential/Platelet - COMPLETE METABOLIC PANEL WITH GFR - Magnesium - Lipid panel - TSH - Hemoglobin A1c - Insulin, random - Testosterone        Discussed  regular exercise, BP monitoring, weight control to achieve/maintain BMI less than 25 and discussed med and SE's. Recommended labs to assess and monitor clinical status with further disposition pending results of labs.  I discussed the assessment and treatment plan with the patient. The patient was provided an opportunity to ask questions and  all were answered. The patient agreed with the plan and demonstrated an understanding of the instructions.  I provided over 30 minutes of exam, counseling, chart review and  complex critical decision making.        The patient was advised to call back or seek an in-person evaluation if the symptoms worsen or if the condition fails to improve as anticipated.   Kirtland Bouchard, MD

## 2020-11-30 NOTE — Patient Instructions (Signed)

## 2020-12-01 ENCOUNTER — Encounter: Payer: Self-pay | Admitting: Internal Medicine

## 2020-12-01 ENCOUNTER — Ambulatory Visit: Payer: 59 | Admitting: Internal Medicine

## 2020-12-01 ENCOUNTER — Other Ambulatory Visit: Payer: Self-pay

## 2020-12-01 VITALS — BP 138/84 | HR 82 | Temp 97.8°F | Resp 16 | Ht 68.5 in | Wt 190.4 lb

## 2020-12-01 DIAGNOSIS — I10 Essential (primary) hypertension: Secondary | ICD-10-CM | POA: Diagnosis not present

## 2020-12-01 DIAGNOSIS — R7309 Other abnormal glucose: Secondary | ICD-10-CM | POA: Diagnosis not present

## 2020-12-01 DIAGNOSIS — E349 Endocrine disorder, unspecified: Secondary | ICD-10-CM

## 2020-12-01 DIAGNOSIS — E559 Vitamin D deficiency, unspecified: Secondary | ICD-10-CM

## 2020-12-01 DIAGNOSIS — E782 Mixed hyperlipidemia: Secondary | ICD-10-CM | POA: Diagnosis not present

## 2020-12-01 DIAGNOSIS — Z9989 Dependence on other enabling machines and devices: Secondary | ICD-10-CM

## 2020-12-01 DIAGNOSIS — M5441 Lumbago with sciatica, right side: Secondary | ICD-10-CM

## 2020-12-01 DIAGNOSIS — G4733 Obstructive sleep apnea (adult) (pediatric): Secondary | ICD-10-CM

## 2020-12-01 DIAGNOSIS — Z79899 Other long term (current) drug therapy: Secondary | ICD-10-CM

## 2020-12-01 MED ORDER — DEXAMETHASONE 4 MG PO TABS: ORAL_TABLET | ORAL | 0 refills | Status: DC

## 2020-12-02 ENCOUNTER — Other Ambulatory Visit: Payer: Self-pay | Admitting: Internal Medicine

## 2020-12-02 DIAGNOSIS — E039 Hypothyroidism, unspecified: Secondary | ICD-10-CM

## 2020-12-02 LAB — TSH: TSH: 5.74 mIU/L — ABNORMAL HIGH (ref 0.40–4.50)

## 2020-12-02 LAB — COMPLETE METABOLIC PANEL WITH GFR
AG Ratio: 1.7 (calc) (ref 1.0–2.5)
ALT: 33 U/L (ref 9–46)
AST: 24 U/L (ref 10–35)
Albumin: 4.3 g/dL (ref 3.6–5.1)
Alkaline phosphatase (APISO): 55 U/L (ref 35–144)
BUN: 12 mg/dL (ref 7–25)
CO2: 30 mmol/L (ref 20–32)
Calcium: 9.3 mg/dL (ref 8.6–10.3)
Chloride: 103 mmol/L (ref 98–110)
Creat: 1.1 mg/dL (ref 0.70–1.30)
Globulin: 2.6 g/dL (calc) (ref 1.9–3.7)
Glucose, Bld: 95 mg/dL (ref 65–99)
Potassium: 4.4 mmol/L (ref 3.5–5.3)
Sodium: 140 mmol/L (ref 135–146)
Total Bilirubin: 0.4 mg/dL (ref 0.2–1.2)
Total Protein: 6.9 g/dL (ref 6.1–8.1)
eGFR: 77 mL/min/{1.73_m2} (ref >=60)

## 2020-12-02 LAB — CBC WITH DIFFERENTIAL/PLATELET
Absolute Monocytes: 319 cells/uL (ref 200–950)
Basophils Absolute: 59 cells/uL (ref 0–200)
Basophils Relative: 1.2 %
Eosinophils Absolute: 230 cells/uL (ref 15–500)
Eosinophils Relative: 4.7 %
HCT: 51.6 % — ABNORMAL HIGH (ref 38.5–50.0)
Hemoglobin: 15.8 g/dL (ref 13.2–17.1)
Lymphs Abs: 887 cells/uL (ref 850–3900)
MCH: 23.9 pg — ABNORMAL LOW (ref 27.0–33.0)
MCHC: 30.6 g/dL — ABNORMAL LOW (ref 32.0–36.0)
MCV: 77.9 fL — ABNORMAL LOW (ref 80.0–100.0)
MPV: 9.6 fL (ref 7.5–12.5)
Monocytes Relative: 6.5 %
Neutro Abs: 3406 cells/uL (ref 1500–7800)
Neutrophils Relative %: 69.5 %
Platelets: 276 10*3/uL (ref 140–400)
RBC: 6.62 10*6/uL — ABNORMAL HIGH (ref 4.20–5.80)
RDW: 16.4 % — ABNORMAL HIGH (ref 11.0–15.0)
Total Lymphocyte: 18.1 %
WBC: 4.9 10*3/uL (ref 3.8–10.8)

## 2020-12-02 LAB — HEMOGLOBIN A1C
Hgb A1c MFr Bld: 5.4 % of total Hgb (ref <5.7)
Mean Plasma Glucose: 108 mg/dL
eAG (mmol/L): 6 mmol/L

## 2020-12-02 LAB — LIPID PANEL
Cholesterol: 155 mg/dL (ref <200)
HDL: 39 mg/dL — ABNORMAL LOW (ref >=40)
LDL Cholesterol (Calc): 87 mg/dL (calc)
Non-HDL Cholesterol (Calc): 116 mg/dL (calc) (ref <130)
Total CHOL/HDL Ratio: 4 (calc) (ref <5.0)
Triglycerides: 196 mg/dL — ABNORMAL HIGH (ref <150)

## 2020-12-02 LAB — MAGNESIUM: Magnesium: 2.1 mg/dL (ref 1.5–2.5)

## 2020-12-02 LAB — TESTOSTERONE: Testosterone: 834 ng/dL — ABNORMAL HIGH (ref 250–827)

## 2020-12-02 LAB — INSULIN, RANDOM: Insulin: 39.5 u[IU]/mL — ABNORMAL HIGH

## 2020-12-02 NOTE — Progress Notes (Signed)
============================================================ -   Test results slightly outside the reference range are not unusual. If there is anything important, I will review this with you,  otherwise it is considered normal test values.  If you have further questions,  please do not hesitate to contact me at the office or via My Chart.  ============================================================ ============================================================  -  Total Chol = 155    &    LDL  Chol = 87   - Both  Excellent   - Very low risk for Heart Attack  / Stroke ============================================================ ============================================================  -  TSH level is slightly elevated ,                                       suggesting thyroid hormone in blood is slightly Low   - So recommend repeat  TSH in about 6 weeks    - Just call office to schedule a Lab visit between Oct 3  to Oct 14  ============================================================ ============================================================  -  Insulin level slightly elevated - probably from haven eaten within 2 hours ============================================================ ============================================================  -  Testosterone is high normal from recent injection ============================================================ ============================================================  -  All Else - CBC - Kidneys - Electrolytes - Liver & Magnesium   - all  Normal / OK ===========================================================

## 2020-12-03 ENCOUNTER — Other Ambulatory Visit: Payer: Self-pay | Admitting: Internal Medicine

## 2020-12-03 DIAGNOSIS — M5441 Lumbago with sciatica, right side: Secondary | ICD-10-CM

## 2020-12-08 ENCOUNTER — Other Ambulatory Visit: Payer: Self-pay | Admitting: Internal Medicine

## 2021-01-01 ENCOUNTER — Other Ambulatory Visit: Payer: Self-pay | Admitting: Internal Medicine

## 2021-01-01 DIAGNOSIS — E349 Endocrine disorder, unspecified: Secondary | ICD-10-CM

## 2021-01-01 DIAGNOSIS — M5441 Lumbago with sciatica, right side: Secondary | ICD-10-CM

## 2021-01-01 MED ORDER — DEXAMETHASONE 4 MG PO TABS: ORAL_TABLET | ORAL | 0 refills | Status: DC

## 2021-01-01 MED ORDER — TESTOSTERONE CYPIONATE 200 MG/ML IM SOLN: INTRAMUSCULAR | 3 refills | Status: DC

## 2021-01-01 MED ORDER — TRAMADOL HCL 50 MG PO TABS: ORAL_TABLET | ORAL | 0 refills | Status: DC

## 2021-01-04 ENCOUNTER — Encounter: Payer: Self-pay | Admitting: Internal Medicine

## 2021-03-16 ENCOUNTER — Other Ambulatory Visit: Payer: Self-pay | Admitting: Internal Medicine

## 2021-03-16 ENCOUNTER — Other Ambulatory Visit: Payer: Self-pay | Admitting: Nurse Practitioner

## 2021-03-16 ENCOUNTER — Encounter: Payer: Self-pay | Admitting: Internal Medicine

## 2021-03-16 DIAGNOSIS — E782 Mixed hyperlipidemia: Secondary | ICD-10-CM

## 2021-03-16 MED ORDER — ROSUVASTATIN CALCIUM 5 MG PO TABS: ORAL_TABLET | ORAL | 1 refills | Status: DC

## 2021-03-16 MED ORDER — TRAMADOL HCL 50 MG PO TABS: ORAL_TABLET | ORAL | 0 refills | Status: DC

## 2021-04-08 ENCOUNTER — Encounter: Payer: Self-pay | Admitting: Internal Medicine

## 2021-04-15 ENCOUNTER — Other Ambulatory Visit: Payer: Self-pay | Admitting: Internal Medicine

## 2021-04-15 MED ORDER — AZITHROMYCIN 250 MG PO TABS: ORAL_TABLET | ORAL | 0 refills | Status: DC

## 2021-04-15 MED ORDER — PROMETHAZINE-DM 6.25-15 MG/5ML PO SYRP: ORAL_SOLUTION | ORAL | 1 refills | Status: DC

## 2021-04-15 MED ORDER — DEXAMETHASONE 4 MG PO TABS: ORAL_TABLET | ORAL | 0 refills | Status: DC

## 2021-04-15 MED ORDER — BENZONATATE 200 MG PO CAPS: ORAL_CAPSULE | ORAL | 1 refills | Status: DC

## 2021-04-27 ENCOUNTER — Other Ambulatory Visit: Payer: Self-pay | Admitting: Nurse Practitioner

## 2021-04-27 ENCOUNTER — Other Ambulatory Visit: Payer: Self-pay | Admitting: Internal Medicine

## 2021-04-27 ENCOUNTER — Encounter: Payer: Self-pay | Admitting: Internal Medicine

## 2021-04-27 DIAGNOSIS — K219 Gastro-esophageal reflux disease without esophagitis: Secondary | ICD-10-CM

## 2021-04-27 MED ORDER — BUTALBITAL-APAP-CAFFEINE 50-325-40 MG PO TABS: ORAL_TABLET | ORAL | 0 refills | Status: DC

## 2021-04-27 MED ORDER — OMEPRAZOLE 40 MG PO CPDR: DELAYED_RELEASE_CAPSULE | ORAL | 3 refills | Status: DC

## 2021-05-25 ENCOUNTER — Other Ambulatory Visit: Payer: Self-pay | Admitting: Internal Medicine

## 2021-05-25 ENCOUNTER — Encounter: Payer: Self-pay | Admitting: Internal Medicine

## 2021-05-25 DIAGNOSIS — K219 Gastro-esophageal reflux disease without esophagitis: Secondary | ICD-10-CM

## 2021-06-08 ENCOUNTER — Encounter: Payer: Self-pay | Admitting: Internal Medicine

## 2021-06-08 MED ORDER — "BD LUER-LOK SYRINGE 22G X 1"" 3 ML MISC": 3 refills | Status: AC

## 2021-06-08 NOTE — Patient Instructions (Signed)
Due to recent changes in healthcare laws, you may see the results of your imaging and laboratory studies on MyChart before your provider has had a chance to review them.  We understand that in some cases there may be results that are confusing or concerning to you. Not all laboratory results come back in the same time frame and the provider may be waiting for multiple results in order to interpret others.  Please give us 48 hours in order for your provider to thoroughly review all the results before contacting the office for clarification of your results.  ° °++++++++++++++++++++++++++++++++++++++ ° Vit D  & °Vit C 1,000 mg   °are recommended to help protect  °against the Covid-19 and other Corona viruses.  ° ° Also it's recommended  °to take  °Zinc 50 mg  °to help  °protect against the Covid-19   °and best place to get ° is also on [employer]  °and don't pay more than 6-8 cents /pill !  °=============================== °Coronavirus (COVID-19) Are you at risk? ° °Are you at risk for the Coronavirus (COVID-19)? ° °To be considered HIGH RISK for Coronavirus (COVID-19), you have to meet the following criteria: ° °Traveled to [place], [place], [place], [place] or [place]; or in the [place] to [place], [place], [place]  °or [place]; and have fever, cough, and shortness of breath within the last 2 weeks of travel OR °Been in close contact with a person diagnosed with COVID-19 within the last 2 weeks and have  °fever, cough,and shortness of breath ° °IF YOU DO NOT MEET THESE CRITERIA, YOU ARE CONSIDERED LOW RISK FOR COVID-19. ° °What to do if you are HIGH RISK for COVID-19? ° °If you are having a medical emergency, call 911. °Seek medical care right away. Before you go to a doctor’s office, urgent care or emergency department, ° call ahead and tell them about your recent travel, contact with someone diagnosed with COVID-19  ° and your symptoms.  °You should receive instructions from your physician’s office  regarding next steps of care.  °When you arrive at healthcare provider, tell the healthcare staff immediately you have returned from  °visiting [place], [place], [place], [place] or [place]; or traveled in the [place] to [place], [place],  °[place] or [place] in the last two weeks or you have been in close contact with a person diagnosed with  °COVID-19 in the last 2 weeks.   °Tell the health care staff about your symptoms: fever, cough and shortness of breath. °After you have been seen by a medical provider, you will be either: °Tested for (COVID-19) and discharged home on quarantine except to seek medical care if  °symptoms worsen, and asked to  °Stay home and avoid contact with others until you get your results (4-5 days)  °Avoid travel on public transportation if possible (such as bus, train, or airplane) or °Sent to the Emergency Department by EMS for evaluation, COVID-19 testing  and  °possible admission depending on your condition and test results. ° °What to do if you are LOW RISK for COVID-19? ° °Reduce your risk of any infection by using the same precautions used for avoiding the common cold or flu:  °Wash your hands often with soap and warm water for at least 20 seconds.  If soap and water are not readily available,  °use an alcohol-based hand sanitizer with at least 60% alcohol.  °If coughing or sneezing, cover your mouth and nose by coughing   or sneezing into the elbow areas of your shirt or coat, ° into a tissue or into your sleeve (not your hands). °Avoid shaking hands with others and consider head nods or verbal greetings only. °Avoid touching your eyes, nose, or mouth with unwashed hands.  °Avoid close contact with people who are sick. °Avoid places or events with large numbers of people in one location, like concerts or sporting events. °Carefully consider travel plans you have or are making. °If you are planning any travel outside or inside the [place], visit the CDC’s Travelers’ Health  webpage for the latest health notices. °If you have some symptoms but not all symptoms, continue to monitor at home and seek medical attention  °if your symptoms worsen. °If you are having a medical emergency, call 911. °>>>>>>>>>>>>>>>>>>>>>>>>>>>> °Preventive Care for Adults ° °A healthy lifestyle and preventive care can promote health and wellness. Preventive health guidelines for men include the following key practices: °A routine yearly physical is a good way to check with your health care provider about your health and preventative screening. It is a chance to share any concerns and updates on your health and to receive a thorough exam. °Visit your dentist for a routine exam and preventative care every 6 months. Brush your teeth twice a day and floss once a day. Good oral hygiene prevents tooth decay and gum disease. °The frequency of eye exams is based on your age, health, family medical history, use of contact lenses, and other factors. Follow your health care provider's recommendations for frequency of eye exams. °Eat a healthy diet. Foods such as vegetables, fruits, whole grains, low-fat dairy products, and lean protein foods contain the nutrients you need without too many calories. Decrease your intake of foods high in solid fats, added sugars, and salt. Eat the right amount of calories for you. Get information about a proper diet from your health care provider, if necessary. °Regular physical exercise is one of the most important things you can do for your health. Most adults should get at least 150 minutes of moderate-intensity exercise (any activity that increases your heart rate and causes you to sweat) each week. In addition, most adults need muscle-strengthening exercises on 2 or more days a week. °Maintain a healthy weight. The body mass index (BMI) is a screening tool to identify possible weight problems. It provides an estimate of body fat based on height and weight. Your health care provider can  find your BMI and can help you achieve or maintain a healthy weight. For adults 20 years and older: °A BMI below 18.5 is considered underweight. °A BMI of 18.5 to 24.9 is normal. °A BMI of 25 to 29.9 is considered overweight. °A BMI of 30 and above is considered obese. °Maintain normal blood lipids and cholesterol levels by exercising and minimizing your intake of saturated fat. Eat a balanced diet with plenty of fruit and vegetables. Blood tests for lipids and cholesterol should begin at age 20 and be repeated every 5 years. If your lipid or cholesterol levels are high, you are over 50, or you are at high risk for heart disease, you may need your cholesterol levels checked more frequently. Ongoing high lipid and cholesterol levels should be treated with medicines if diet and exercise are not working. °If you smoke, find out from your health care provider how to quit. If you do not use tobacco, do not start. °Lung cancer screening is recommended for adults aged 55-80 years who are at high risk for   developing lung cancer because of a history of smoking. A yearly low-dose CT scan of the lungs is recommended for people who have at least a 30-pack-year history of smoking and are a current smoker or have quit within the past 15 years. A pack year of smoking is smoking an average of 1 pack of cigarettes a day for 1 year (for example: 1 pack a day for 30 years or 2 packs a day for 15 years). Yearly screening should continue until the smoker has stopped smoking for at least 15 years. Yearly screening should be stopped for people who develop a health problem that would prevent them from having lung cancer treatment. °If you choose to drink alcohol, do not have more than 2 drinks per day. One drink is considered to be 12 ounces (355 mL) of beer, 5 ounces (148 mL) of wine, or 1.5 ounces (44 mL) of liquor. °Avoid use of street drugs. Do not share needles with anyone. Ask for help if you need support or instructions about  stopping the use of drugs. °High blood pressure causes heart disease and increases the risk of stroke. Your blood pressure should be checked at least every 1-2 years. Ongoing high blood pressure should be treated with medicines, if weight loss and exercise are not effective. °If you are 45-79 years old, ask your health care provider if you should take aspirin to prevent heart disease. °Diabetes screening involves taking a blood sample to check your fasting blood sugar level. This should be done once every 3 years, after age 45, if you are within normal weight and without risk factors for diabetes. Testing should be considered at a younger age or be carried out more frequently if you are overweight and have at least 1 risk factor for diabetes. °Colorectal cancer can be detected and often prevented. Most routine colorectal cancer screening begins at the age of 50 and continues through age 75. However, your health care provider may recommend screening at an earlier age if you have risk factors for colon cancer. On a yearly basis, your health care provider may provide home test kits to check for hidden blood in the stool. Use of a small camera at the end of a tube to directly examine the colon (sigmoidoscopy or colonoscopy) can detect the earliest forms of colorectal cancer. Talk to your health care provider about this at age 50, when routine screening begins. Direct exam of the colon should be repeated every 5-10 years through age 75, unless early forms of precancerous polyps or small growths are found. ° Talk with your health care provider about prostate cancer screening. °Testicular cancer screening isrecommended for adult males. Screening includes self-exam, a health care provider exam, and other screening tests. Consult with your health care provider about any symptoms you have or any concerns you have about testicular cancer. °Use sunscreen. Apply sunscreen liberally and repeatedly throughout the day. You should  seek shade when your shadow is shorter than you. Protect yourself by wearing long sleeves, pants, a wide-brimmed hat, and sunglasses year round, whenever you are outdoors. °Once a month, do a whole-body skin exam, using a mirror to look at the skin on your back. Tell your health care provider about new moles, moles that have irregular borders, moles that are larger than a pencil eraser, or moles that have changed in shape or color. °Stay current with required vaccines (immunizations). °Influenza vaccine. All adults should be immunized every year. °Tetanus, diphtheria, and acellular pertussis (Td, Tdap) vaccine. An   adult who has not previously received Tdap or who does not know his vaccine status should receive 1 dose of Tdap. This initial dose should be followed by tetanus and diphtheria toxoids (Td) booster doses every 10 years. Adults with an unknown or incomplete history of completing a 3-dose immunization series with Td-containing vaccines should begin or complete a primary immunization series including a Tdap dose. Adults should receive a Td booster every 10 years. °Varicella vaccine. An adult without evidence of immunity to varicella should receive 2 doses or a second dose if he has previously received 1 dose. °Human papillomavirus (HPV) vaccine. Males aged 13-21 years who have not received the vaccine previously should receive the 3-dose series. Males aged 22-26 years may be immunized. Immunization is recommended through the age of 26 years for any male who has sex with males and did not get any or all doses earlier. Immunization is recommended for any person with an immunocompromised condition through the age of 26 years if he did not get any or all doses earlier. During the 3-dose series, the second dose should be obtained 4-8 weeks after the first dose. The third dose should be obtained 24 weeks after the first dose and 16 weeks after the second dose. °Zoster vaccine. One dose is recommended for adults  aged 60 years or older unless certain conditions are present. ° °PREVNAR  - Pneumococcal 13-valent conjugate (PCV13) vaccine. When indicated, a person who is uncertain of his immunization history and has no record of immunization should receive the PCV13 vaccine. An adult aged 19 years or older who has certain medical conditions and has not been previously immunized should receive 1 dose of PCV13 vaccine. This PCV13 should be followed with a dose of pneumococcal polysaccharide (PPSV23) vaccine. The PPSV23 vaccine dose should be obtained at least 1 r more year(s) after the dose of PCV13 vaccine. An adult aged 19 years or older who has certain medical conditions and previously received 1 or more doses of PPSV23 vaccine should receive 1 dose of PCV13. The PCV13 vaccine dose should be obtained 1 or more years after the last PPSV23 vaccine dose. ° °PNEUMOVAX - Pneumococcal polysaccharide (PPSV23) vaccine. When PCV13 is also indicated, PCV13 should be obtained first. All adults aged 65 years and older should be immunized. An adult younger than age 65 years who has certain medical conditions should be immunized. Any person who resides in a nursing home or long-term care facility should be immunized. An adult smoker should be immunized. People with an immunocompromised condition and certain other conditions should receive both PCV13 and PPSV23 vaccines. People with human immunodeficiency virus (HIV) infection should be immunized as soon as possible after diagnosis. Immunization during chemotherapy or radiation therapy should be avoided. Routine use of PPSV23 vaccine is not recommended for American Indians, [place] Natives, or people younger than 65 years unless there are medical conditions that require PPSV23 vaccine. When indicated, people who have unknown immunization and have no record of immunization should receive PPSV23 vaccine. One-time revaccination 5 years after the first dose of PPSV23 is recommended for people  aged 19-64 years who have chronic kidney failure, nephrotic syndrome, asplenia, or immunocompromised conditions. People who received 1-2 doses of PPSV23 before age 65 years should receive another dose of PPSV23 vaccine at age 65 years or later if at least 5 years have passed since the previous dose. Doses of PPSV23 are not needed for people immunized with PPSV23 at or after age 65 years. ° °Hepatitis A vaccine.   Adults who wish to be protected from this disease, have certain high-risk conditions, work with hepatitis A-infected animals, work in hepatitis A research labs, or travel to or work in countries with a high rate of hepatitis A should be immunized. Adults who were previously unvaccinated and who anticipate close contact with an international adoptee during the first 60 days after arrival in the [place] from a country with a high rate of hepatitis A should be immunized. ° °Hepatitis B vaccine. Adults should be immunized if they wish to be protected from this disease, have certain high-risk conditions, may be exposed to blood or other infectious body fluids, are household contacts or sex partners of hepatitis B positive people, are clients or workers in certain care facilities, or travel to or work in countries with a high rate of hepatitis B. ° °Preventive Service / Frequency ° °Ages 40 to 64 °Blood pressure check. °Lipid and cholesterol check °Lung cancer screening. / Every year if you are aged 55-80 years and have a 30-pack-year history of smoking and currently smoke or have quit within the past 15 years. Yearly screening is stopped once you have quit smoking for at least 15 years or develop a health problem that would prevent you from having lung cancer treatment. °Fecal occult blood test (FOBT) of stool. / Every year beginning at age 50 and continuing until age 75. You may not have to do this test if you get a colonoscopy every 10 years. °Flexible sigmoidoscopy** or colonoscopy.** / Every 5 years for  a flexible sigmoidoscopy or every 10 years for a colonoscopy beginning at age 50 and continuing until age 75. °Screening for abdominal aortic aneurysm (AAA)  by ultrasound is recommended for people who have history of high blood pressure or who are current or former smokers. °+++++++++++ °Recommend Adult Low Dose Aspirin or  °coated  Aspirin 81 mg daily  °To reduce risk of Colon Cancer 40 %, ° Skin Cancer 26 % ,  °Malignant Melanoma 46% ° and  °Pancreatic cancer 60% °++++++++++++++++++++ °Vitamin D goal ° is between 70-100.  °Please make sure that you are taking your Vitamin D as directed.  °It is very important as a natural anti-inflammatory  °helping hair, skin, and nails, as well as reducing stroke and heart attack risk.  °It helps your bones and helps with mood. °It also decreases numerous cancer risks so please take it as directed.  °Low Vit D is associated with a 200-300% higher risk for CANCER  °and 200-300% higher risk for HEART   ATTACK  &  STROKE.   °...................................... °It is also associated with higher death rate at younger ages,  °autoimmune diseases like Rheumatoid arthritis, Lupus, Multiple Sclerosis.    °Also many other serious conditions, like depression, Alzheimer's °Dementia, infertility, muscle aches, fatigue, fibromyalgia - just to name a few. °+++++++++++++++++++++ °Recommend the book "The END of DIETING" by Dr Joel Fuhrman  °& the book "The END of DIABETES " by Dr Joel Fuhrman °At [employer] - get book & Audio CD's  °  Being diabetic has a  300% increased risk for heart attack, stroke, cancer, and alzheimer- type vascular dementia. It is very important that you work harder with diet by avoiding all foods that are white. Avoid white rice (brown & wild rice is OK), white potatoes (sweetpotatoes in moderation is OK), White bread or wheat bread or anything made out of white flour like bagels, donuts, rolls, buns, biscuits, cakes, pastries, cookies, pizza crust, and pasta (made    from white flour & egg whites) - vegetarian pasta or spinach or wheat pasta is OK. Multigrain breads like Arnold's or Pepper[employer], or multigrain sandwich thins or flatbreads.  Diet, exercise and weight loss can reverse and cure diabetes in the early stages.  Diet, exercise and weight loss is very important in the control and prevention of complications of diabetes which affects every system in your body, ie. Brain - dementia/stroke, eyes - glaucoma/blindness, heart - heart attack/heart failure, kidneys - dialysis, stomach - gastric paralysis, intestines - malabsorption, nerves - severe painful neuritis, circulation - gangrene & loss of a leg(s), and finally cancer and Alzheimers. ° °  I recommend avoid fried & greasy foods,  sweets/candy, white rice (Burdo or wild rice or Quinoa is OK), white potatoes (sweet potatoes are OK) - anything made from white flour - bagels, doughnuts, rolls, buns, biscuits,white and wheat breads, pizza crust and traditional pasta made of white flour & egg white(vegetarian pasta or spinach or wheat pasta is OK).  Multi-grain bread is OK - like multi-grain flat bread or sandwich thins. Avoid alcohol in excess. Exercise is also important. ° °  Eat all the vegetables you want - avoid meat, especially red meat and dairy - especially cheese.  Cheese is the most concentrated form of trans-fats which is the worst thing to clog up our arteries. Veggie cheese is OK which can be found in the fresh produce section at [employer] or [employer] or [employer] ° °++++++++++++++++++++++ °DASH Eating Plan ° °DASH stands for "Dietary Approaches to Stop Hypertension."  ° °The DASH eating plan is a healthy eating plan that has been shown to reduce high blood pressure (hypertension). Additional health benefits may include reducing the risk of type 2 diabetes mellitus, heart disease, and stroke. The DASH eating plan may also help with weight loss. °WHAT DO I NEED TO KNOW ABOUT THE DASH EATING PLAN? °For  the DASH eating plan, you will follow these general guidelines: °Choose foods with a percent daily value for sodium of less than 5% (as listed on the food label). °Use salt-free seasonings or herbs instead of table salt or sea salt. °Check with your health care provider or pharmacist before using salt substitutes. °Eat lower-sodium products, often labeled as "lower sodium" or "no salt added." °Eat fresh foods. °Eat more vegetables, fruits, and low-fat dairy products. °Choose whole grains. Look for the word "whole" as the first word in the ingredient list. °Choose fish  °Limit sweets, desserts, sugars, and sugary drinks. °Choose heart-healthy fats. °Eat veggie cheese  °Eat more home-cooked food and less restaurant, buffet, and fast food. °Limit fried foods. °Cook foods using methods other than frying. °Limit canned vegetables. If you do use them, rinse them well to decrease the sodium. °When eating at a restaurant, ask that your food be prepared with less salt, or no salt if possible. °                  °   WHAT FOODS CAN I EAT? °Read Dr Joel Fuhrman's books on The End of Dieting & The End of Diabetes ° °Grains °Whole grain or whole wheat bread. [place] rice. Whole grain or whole wheat pasta. Quinoa, bulgur, and whole grain cereals. Low-sodium cereals. Corn or whole wheat flour tortillas. Whole grain cornbread. Whole grain crackers. Low-sodium crackers. ° °Vegetables °Fresh or frozen vegetables (raw, steamed, roasted, or grilled). Low-sodium or reduced-sodium tomato and vegetable juices. Low-sodium or reduced-sodium tomato sauce and paste. Low-sodium or reduced-sodium canned vegetables.  ° °  Fruits °All fresh, canned (in natural juice), or frozen fruits. ° °Protein Products ° All fish and seafood.  Dried beans, peas, or lentils. Unsalted nuts and seeds. Unsalted canned beans. ° °Dairy °Low-fat dairy products, such as skim or 1% milk, 2% or reduced-fat cheeses, low-fat ricotta or cottage cheese, or plain low-fat yogurt.  Low-sodium or reduced-sodium cheeses. ° °Fats and Oils °Tub margarines without trans fats. Light or reduced-fat mayonnaise and salad dressings (reduced sodium). Avocado. Safflower, olive, or canola oils. Natural peanut or almond butter. ° °Other °Unsalted popcorn and pretzels. °The items listed above may not be a complete list of recommended foods or beverages. Contact your dietitian for more options. ° °+++++++++++++++++++ ° °WHAT FOODS ARE NOT RECOMMENDED? °Grains/ White flour or wheat flour °White bread. White pasta. White rice. Refined cornbread. Bagels and croissants. Crackers that contain trans fat. ° °Vegetables ° °Creamed or fried vegetables. Vegetables in a . Regular canned vegetables. Regular canned tomato sauce and paste. Regular tomato and vegetable juices. ° °Fruits °Dried fruits. Canned fruit in light or heavy syrup. Fruit juice. ° °Meat and Other Protein Products °Meat in general - RED meat & White meat.  Fatty cuts of meat. Ribs, chicken wings, all processed meats as bacon, sausage, bologna, salami, fatback, hot dogs, bratwurst and packaged luncheon meats. ° °Dairy °Whole or 2% milk, cream, half-and-half, and cream cheese. Whole-fat or sweetened yogurt. Full-fat cheeses or blue cheese. Non-dairy creamers and whipped toppings. Processed cheese, cheese spreads, or cheese curds. ° °Condiments °Onion and garlic salt, seasoned salt, table salt, and sea salt. Canned and packaged gravies. Worcestershire sauce. Tartar sauce. Barbecue sauce. Teriyaki sauce. Soy sauce, including reduced sodium. Steak sauce. Fish sauce. Oyster sauce. Cocktail sauce. Horseradish. Ketchup and mustard. Meat flavorings and tenderizers. Bouillon cubes. Hot sauce. Tabasco sauce. Marinades. Taco seasonings. Relishes. ° °Fats and Oils °Butter, stick margarine, lard, shortening and bacon fat. Coconut, palm kernel, or palm oils. Regular salad dressings. ° °Pickles and olives. Salted popcorn and pretzels. ° °The items listed above may not  be a complete list of foods and beverages to avoid. ° ° °

## 2021-06-08 NOTE — Progress Notes (Signed)
Annual  Screening/Preventative Visit  & Comprehensive Evaluation & Examination  Future Appointments  Date Time Provider Department  06/09/2021  2:00 PM [name], MD [facility]  06/15/2022  2:00 PM [name], MD [facility]            This very nice 61 y.o.  MWM presents for a Screening /Preventative Visit & comprehensive evaluation and management of multiple medical co-morbidities.  Patient has been followed for HTN, HLD, Prediabetes and Vitamin D Deficiency.  Patient is on CPAP with improved sleep hygiene. Patient is on GERD      HTN predates since  2007. Patient's BP has been controlled at home.  Today's BP was initially sl elevated and rechecked at goal - 136/80. Patient denies any cardiac symptoms as chest pain, palpitations, shortness of breath, dizziness or ankle swelling.       Patient's hyperlipidemia is controlled with diet and Rosuvastatin. Patient denies myalgias or other medication SE's. Last lipids were at goal except elevated Trig's :  Lab Results  Component Value Date   CHOL 155 12/01/2020   HDL 39 (L) 12/01/2020   LDLCALC 87 12/01/2020   TRIG 196 (H) 12/01/2020   CHOLHDL 4.0 12/01/2020         Patient has hx/o prediabetes & Insulin Resistance  (A1c 5.4% w/Insulin 45 /2015)   resolved with improved diet & weight loss.   Patient denies reactive hypoglycemic symptoms, visual blurring, diabetic polys or paresthesias. Last A1c was normal & at goal :   Lab Results  Component Value Date   HGBA1C 5.4 12/01/2020                                                       Patient has hx/o Low Testosterone and has been on parenteral replacement with improved stamina & sense of well-being.                       Finally, patient has history of Vitamin D Deficiency &  last vitamin D was at goal :   Lab Results  Component Value Date   VD25OH 28 06/02/2020     Current Outpatient Medications on File Prior to Visit  Medication Sig   traMADol \ 50 MG tablet Take 1  tablet every 4 hours ONLY if needed for SEVERE pain   ADDERALL  20 MG tablet Take 1/2 to 1 tablet 1 or 2 x /day for Focus & Concentration   FIORICET TAKE 1 TO 2 TABLETS 4 TIMES A DAY ONLY IF NEEDED    (  OFF)    omeprazole  40 MG capsule TAKE 1 CAPSULE DAILY    pregabalin 75 MG capsule Take 1 capsule 2 x /day for pain   rosuvastatin  5 MG tablet TAKE 1 TABLET  DAILY    testosterone cypio 200 MG/ML inj Inject 2 ml intramuscular every 14 days    No Known Allergies   Past Medical History:  Diagnosis Date   Allergy    Hyperlipidemia    Hypertension    OSA (obstructive sleep apnea)      Health Maintenance  Topic Date Due   Zoster Vaccines- Shingrix (1 of 2) Never done   INFLUENZA VACCINE  11/09/2020   TETANUS/TDAP  10/26/2023   Hepatitis C Screening  Completed   HIV Screening  Completed   HPV VACCINES  Aged Out     Immunization History  Administered Date(s) Administered   Influenza Inj Mdck Quad  01/03/2018, 02/28/2020   Influenza,inj,Quad  01/30/2017, 02/10/2019   Influenza,inj,quad 01/29/2016   Influenza 01/30/2017   PPD Test 04/13/2018, 05/22/2019, 06/02/2020   Pneumococcal-23 12/22/2014   Tdap 10/25/2013    Last Colon - 03/21/2012 - Dr Lizbeth Bark - recc a 5 yr f/u - due Jan 2019 - Overdue    Past Surgical History:  Procedure Laterality Date   APPENDECTOMY  1983   HAND SURGERY Right 2007     Family History  Problem Relation Age of Onset   Cancer Father        SCC- head and neck     Social History   Socioeconomic History   Marital status: Married      Spouse name: Baldo Ash   Number of children: 2 daughters by 1st marriage ( 18 & 70 yo)   Occupational History   Retired Engineer, 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 Does c/o fatigue. Eyes: Denies redness, blurred vision, diplopia, discharge, itchy or watery eyes.  ENT: Denies discharge, congestion, post nasal drip, epistaxis, sore throat, earache, hearing loss, dental pain, Tinnitus, Vertigo, Sinus pain or snoring.  Cardio: Denies chest pain, palpitations, irregular heartbeat, syncope, dyspnea, diaphoresis, orthopnea, PND, claudication or edema Respiratory: denies cough, dyspnea, DOE, pleurisy, hoarseness, laryngitis or wheezing.  Gastrointestinal: Denies dysphagia, heartburn, reflux, water brash, pain, cramps, nausea, vomiting, bloating, diarrhea, constipation, hematemesis, melena, hematochezia, jaundice or hemorrhoids Genitourinary: Denies dysuria, frequency, urgency, nocturia, hesitancy, discharge, hematuria or flank pain Musculoskeletal: Denies arthralgia, myalgia, stiffness, Jt. Swelling, pain, limp or strain/sprain. Denies Falls. Skin: Denies puritis, rash, hives, warts, acne, eczema or change in skin lesion Neuro: No weakness, tremor, incoordination, spasms, paresthesia or pain Psychiatric: Denies confusion, memory loss or sensory loss. Denies Depression. Endocrine: Denies change in weight, skin, hair change, nocturia, and paresthesia, diabetic polys, visual blurring or hyper / hypo glycemic episodes.  Heme/Lymph: No excessive bleeding, bruising or enlarged lymph nodes.   Physical Exam  BP 136/80    Pulse 87    Temp 97.9 F (36.6 C)    Resp 16    Ht 5' 8.5" (1.74 m)    Wt 187 lb 9.6 oz (85.1 kg)    SpO2 96%    BMI 28.11 kg/m   General Appearance: Well nourished and well groomed and in no apparent distress.  Eyes: PERRLA, EOMs, conjunctiva no swelling or erythema, normal fundi and vessels. Sinuses: No frontal/maxillary tenderness ENT/Mouth: EACs patent / TMs  nl. Nares clear without erythema, swelling, mucoid exudates. Oral  hygiene is good. No erythema, swelling, or exudate. Tongue normal, non-obstructing. Tonsils not swollen or erythematous. Hearing normal.  Neck: Supple, thyroid not palpable. No bruits, nodes or JVD. Respiratory: Respiratory effort normal.  BS equal and clear bilateral without rales, rhonci, wheezing or stridor. Cardio: Heart sounds are normal with regular rate and rhythm and no murmurs, rubs or gallops. Peripheral pulses are normal and equal bilaterally without edema. No aortic or femoral bruits. Chest: symmetric with normal  excursions and percussion.  Abdomen: Soft, with Nl bowel sounds. Nontender, no guarding, rebound, hernias, masses, or organomegaly.  Lymphatics: Non tender without lymphadenopathy.  Musculoskeletal: Full ROM all peripheral extremities, joint stability, 5/5 strength, and normal gait. Skin: Warm and dry without rashes, lesions, cyanosis, clubbing or  ecchymosis.  Neuro: Cranial nerves intact, reflexes equal bilaterally. Normal muscle tone, no cerebellar symptoms. Sensation intact.  Pysch: Alert and oriented X 3 with normal affect, insight and judgment appropriate.   Assessment and Plan  1. Annual Preventative/Screening Exam    2. Essential hypertension   - EKG 12-Lead - [place], RETROPERITNL ABD,  LTD - Urinalysis, Routine w reflex microscopic - Microalbumin / creatinine urine ratio - CBC with Differential/Platelet - COMPLETE METABOLIC PANEL WITH GFR - Magnesium  3. Hyperlipidemia, mixed  - EKG 12-Lead - [place], RETROPERITNL ABD,  LTD - Lipid panel - TSH  4. Abnormal glucose  - EKG 12-Lead - [place], RETROPERITNL ABD,  LTD - Hemoglobin A1c - Insulin, random  5. Vitamin D deficiency  - VITAMIN D 25 Hydroxy   6. Hypothyroidism, unspecified type  - TSH  7. Insulin resistance / Prediabetes  - EKG 12-Lead - [place], RETROPERITNL ABD,  LTD - Hemoglobin A1c - Insulin, random  8. Testosterone deficiency  - Testosterone  9. 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 OSA on CPAP   12. Prostate cancer screening  - PSA  13. Screening examination for pulmonary tuberculosis  - TB Skin Test  14. Screening for colorectal cancer  - POC Hemoccult Bld/Stl   15. Screening for ischemic heart disease  - EKG 12-Lead  16. FH: hypertension  - EKG 12-Lead - [place], RETROPERITNL ABD,  LTD  17. Screening for AAA (aortic abdominal aneurysm)  - [place], RETROPERITNL ABD,  LTD - Iron, Total/Total Iron Binding Cap - Vitamin B12 - CBC with Differential/Platelet - TSH  18. Medication management  - Urinalysis, Routine w reflex microscopic - Microalbumin / creatinine urine ratio - Testosterone - CBC with Differential/Platelet - COMPLETE METABOLIC PANEL WITH GFR - Magnesium - Lipid panel - TSH - Hemoglobin A1c - Insulin, random - VITAMIN D 25 Hydroxy         Patient was counseled in prudent diet, weight control to achieve/maintain BMI less than 25, BP monitoring, regular exercise and medications as discussed.  Discussed med effects and SE's. Routine screening labs and tests as requested with regular follow-up as recommended. Over 40 minutes of exam, counseling, chart review and high complex critical decision making was performed   Kirtland Bouchard, MDm

## 2021-06-09 ENCOUNTER — Ambulatory Visit: Payer: 59 | Admitting: Internal Medicine

## 2021-06-09 ENCOUNTER — Encounter: Payer: Self-pay | Admitting: Internal Medicine

## 2021-06-09 ENCOUNTER — Other Ambulatory Visit: Payer: Self-pay

## 2021-06-09 VITALS — BP 136/80 | HR 87 | Temp 97.9°F | Resp 16 | Ht 68.5 in | Wt 187.6 lb

## 2021-06-09 DIAGNOSIS — Z8249 Family history of ischemic heart disease and other diseases of the circulatory system: Secondary | ICD-10-CM

## 2021-06-09 DIAGNOSIS — I10 Essential (primary) hypertension: Secondary | ICD-10-CM

## 2021-06-09 DIAGNOSIS — E349 Endocrine disorder, unspecified: Secondary | ICD-10-CM

## 2021-06-09 DIAGNOSIS — R7303 Prediabetes: Secondary | ICD-10-CM

## 2021-06-09 DIAGNOSIS — G4733 Obstructive sleep apnea (adult) (pediatric): Secondary | ICD-10-CM

## 2021-06-09 DIAGNOSIS — Z0000 Encounter for general adult medical examination without abnormal findings: Secondary | ICD-10-CM | POA: Diagnosis not present

## 2021-06-09 DIAGNOSIS — Z136 Encounter for screening for cardiovascular disorders: Secondary | ICD-10-CM | POA: Diagnosis not present

## 2021-06-09 DIAGNOSIS — Z111 Encounter for screening for respiratory tuberculosis: Secondary | ICD-10-CM

## 2021-06-09 DIAGNOSIS — E782 Mixed hyperlipidemia: Secondary | ICD-10-CM

## 2021-06-09 DIAGNOSIS — Z0001 Encounter for general adult medical examination with abnormal findings: Secondary | ICD-10-CM

## 2021-06-09 DIAGNOSIS — E8881 Metabolic syndrome: Secondary | ICD-10-CM

## 2021-06-09 DIAGNOSIS — F988 Other specified behavioral and emotional disorders with onset usually occurring in childhood and adolescence: Secondary | ICD-10-CM

## 2021-06-09 DIAGNOSIS — Z125 Encounter for screening for malignant neoplasm of prostate: Secondary | ICD-10-CM

## 2021-06-09 DIAGNOSIS — R7309 Other abnormal glucose: Secondary | ICD-10-CM

## 2021-06-09 DIAGNOSIS — Z1211 Encounter for screening for malignant neoplasm of colon: Secondary | ICD-10-CM

## 2021-06-09 DIAGNOSIS — E039 Hypothyroidism, unspecified: Secondary | ICD-10-CM

## 2021-06-09 DIAGNOSIS — K219 Gastro-esophageal reflux disease without esophagitis: Secondary | ICD-10-CM

## 2021-06-09 DIAGNOSIS — E559 Vitamin D deficiency, unspecified: Secondary | ICD-10-CM

## 2021-06-09 DIAGNOSIS — Z79899 Other long term (current) drug therapy: Secondary | ICD-10-CM

## 2021-06-10 LAB — CBC WITH DIFFERENTIAL/PLATELET
Absolute Monocytes: 562 cells/uL (ref 200–950)
Basophils Absolute: 62 cells/uL (ref 0–200)
Basophils Relative: 0.8 %
Eosinophils Absolute: 234 cells/uL (ref 15–500)
Eosinophils Relative: 3 %
HCT: 49.5 % (ref 38.5–50.0)
Hemoglobin: 15.8 g/dL (ref 13.2–17.1)
Lymphs Abs: 1303 cells/uL (ref 850–3900)
MCH: 25 pg — ABNORMAL LOW (ref 27.0–33.0)
MCHC: 31.9 g/dL — ABNORMAL LOW (ref 32.0–36.0)
MCV: 78.4 fL — ABNORMAL LOW (ref 80.0–100.0)
MPV: 9.7 fL (ref 7.5–12.5)
Monocytes Relative: 7.2 %
Neutro Abs: 5639 cells/uL (ref 1500–7800)
Neutrophils Relative %: 72.3 %
Platelets: 310 10*3/uL (ref 140–400)
RBC: 6.31 10*6/uL — ABNORMAL HIGH (ref 4.20–5.80)
RDW: 15.8 % — ABNORMAL HIGH (ref 11.0–15.0)
Total Lymphocyte: 16.7 %
WBC: 7.8 10*3/uL (ref 3.8–10.8)

## 2021-06-10 LAB — URINALYSIS, ROUTINE W REFLEX MICROSCOPIC
Bilirubin Urine: NEGATIVE
Glucose, UA: NEGATIVE
Hgb urine dipstick: NEGATIVE
Ketones, ur: NEGATIVE
Leukocytes,Ua: NEGATIVE
Nitrite: NEGATIVE
Protein, ur: NEGATIVE
Specific Gravity, Urine: 1.014 (ref 1.001–1.035)
pH: 6 (ref 5.0–8.0)

## 2021-06-10 LAB — COMPLETE METABOLIC PANEL WITH GFR
AG Ratio: 1.6 (calc) (ref 1.0–2.5)
ALT: 31 U/L (ref 9–46)
AST: 24 U/L (ref 10–35)
Albumin: 4.5 g/dL (ref 3.6–5.1)
Alkaline phosphatase (APISO): 59 U/L (ref 35–144)
BUN: 13 mg/dL (ref 7–25)
CO2: 29 mmol/L (ref 20–32)
Calcium: 9.8 mg/dL (ref 8.6–10.3)
Chloride: 102 mmol/L (ref 98–110)
Creat: 0.99 mg/dL (ref 0.70–1.35)
Globulin: 2.8 g/dL (calc) (ref 1.9–3.7)
Glucose, Bld: 89 mg/dL (ref 65–99)
Potassium: 4.6 mmol/L (ref 3.5–5.3)
Sodium: 139 mmol/L (ref 135–146)
Total Bilirubin: 0.5 mg/dL (ref 0.2–1.2)
Total Protein: 7.3 g/dL (ref 6.1–8.1)
eGFR: 87 mL/min/{1.73_m2} (ref >=60)

## 2021-06-10 LAB — HEMOGLOBIN A1C
Hgb A1c MFr Bld: 5.6 % of total Hgb (ref <5.7)
Mean Plasma Glucose: 114 mg/dL
eAG (mmol/L): 6.3 mmol/L

## 2021-06-10 LAB — IRON, TOTAL/TOTAL IRON BINDING CAP
%SAT: 8 % (calc) — ABNORMAL LOW (ref 20–48)
Iron: 31 ug/dL — ABNORMAL LOW (ref 50–180)
TIBC: 385 mcg/dL (calc) (ref 250–425)

## 2021-06-10 LAB — LIPID PANEL
Cholesterol: 182 mg/dL (ref <200)
HDL: 49 mg/dL (ref >=40)
LDL Cholesterol (Calc): 107 mg/dL (calc) — ABNORMAL HIGH
Non-HDL Cholesterol (Calc): 133 mg/dL (calc) — ABNORMAL HIGH (ref <130)
Total CHOL/HDL Ratio: 3.7 (calc) (ref <5.0)
Triglycerides: 143 mg/dL (ref <150)

## 2021-06-10 LAB — MICROALBUMIN / CREATININE URINE RATIO
Creatinine, Urine: 79 mg/dL (ref 20–320)
Microalb Creat Ratio: 5 mcg/mg creat (ref <30)
Microalb, Ur: 0.4 mg/dL

## 2021-06-10 LAB — PSA: PSA: 5.52 ng/mL — ABNORMAL HIGH (ref <=4.00)

## 2021-06-10 LAB — INSULIN, RANDOM: Insulin: 6.8 u[IU]/mL

## 2021-06-10 LAB — TSH: TSH: 3.83 mIU/L (ref 0.40–4.50)

## 2021-06-10 LAB — VITAMIN B12: Vitamin B-12: 466 pg/mL (ref 200–1100)

## 2021-06-10 LAB — MAGNESIUM: Magnesium: 2.2 mg/dL (ref 1.5–2.5)

## 2021-06-10 LAB — VITAMIN D 25 HYDROXY (VIT D DEFICIENCY, FRACTURES): Vit D, 25-Hydroxy: 72 ng/mL (ref 30–100)

## 2021-06-10 LAB — TESTOSTERONE: Testosterone: 646 ng/dL (ref 250–827)

## 2021-06-10 NOTE — Progress Notes (Signed)
<><><><><><><><><><><><><><><><><><><><><><><><><><><><><><><><>  ? ?-   Test results slightly outside the reference range are not unusual.  ?If there is anything important, I will review this with you,  ?otherwise it is considered normal test values.  ?If you have further questions,  ?please do not hesitate to contact me at the office or via My Chart.  ? ?<><><><><><><><><><><><><><><><><><><><><><><><><><><><><><><><> ?<><><><><><><><><><><><><><><><><><><><><><><><><><><><><><><><>  ? ?- PSA - Sl lower ?- Great  ?<><><><><><><><><><><><><><><><><><><><><><><><><><><><><><><><> ?<><><><><><><><><><><><><><><><><><><><><><><><><><><><><><><><>  ? ?- Total Chol ?= 182 ? ?Excellent  ? ?- Very low risk for Heart Attack ?/ Stroke  ?<><><><><><><><><><><><><><><><><><><><><><><><><><><><><><><><> ?<><><><><><><><><><><><><><><><><><><><><><><><><><><><><><><><>  ? ?- Iron level is STILL  Low  ? ?- Recommend take an OTC Iron supplement and eat more Veggies with Iron as  ? ?Carrots, Beets , all leafy green veggies as Spinach, Collards,   ? ?Turnip - Mustard or Mixed Greens, [place], Asparagus, Broccoli,  ? ?[employer], Green Beans / peas, Soybeans, Lentils, Sweet Potatoes ? ?<><><><><><><><><><><><><><><><><><><><><><><><><><><><><><><><> ?<><><><><><><><><><><><><><><><><><><><><><><><><><><><><><><><>  ? ?- Total Chol = 182   -  Excellent  ? ?- Very low risk for Heart Attack  / Stroke ?============================================================ ?============================================================ ? ?-  But LDL Chol =107 - is still slightly elevated , So diet is still very important ? ?- Cholesterol only comes from animal sources  ?- ie. meat, dairy, egg yolks ? ?- Eat all the vegetables you want. ? ?- Avoid meat, especially red meat - Beef AND Pork . ? ?- Avoid cheese & dairy - milk & ice cream.    ? ?- Cheese is the most concentrated form of trans-fats which  ?is the worst thing to clog up our arteries.  ? ?-  Veggie cheese is OK which can be found in the fresh  ?produce section at [employer] or [employer] or [employer] ? ?<><><><><><><><><><><><><><><><><><><><><><><><><><><><><><><><> ?<><><><><><><><><><><><><><><><><><><><><><><><><><><><><><><><>  ? ?- Vitamin B12 - OK  ?<><><><><><><><><><><><><><><><><><><><><><><><><><><><><><><><> ?<><><><><><><><><><><><><><><><><><><><><><><><><><><><><><><><>  ? ?- A1c - Normal -No Diabetes - Great  !\ ?<><><><><><><><><><><><><><><><><><><><><><><><><><><><><><><><> ?<><><><><><><><><><><><><><><><><><><><><><><><><><><><><><><><>  ? ?- Vitamin D = 72 - Excellent - Please keep dose same  ?<><><><><><><><><><><><><><><><><><><><><><><><><><><><><><><><> ?<><><><><><><><><><><><><><><><><><><><><><><><><><><><><><><><>  ? ?- All Else - CBC - Kidneys - Electrolytes - Liver - Magnesium & Thyroid   ? ?- all  Normal / OK ?<><><><><><><><><><><><><><><><><><><><><><><><><><><><><><><><> ?<><><><><><><><><><><><><><><><><><><><><><><><><><><><><><><><> ? ? ? ? ? ? ? ? ? ? ? ? ? ? ? ? ? ? ? ? ? ? ? ? ? ? ? ? ? ?

## 2021-06-13 ENCOUNTER — Other Ambulatory Visit: Payer: Self-pay | Admitting: Internal Medicine

## 2021-06-13 ENCOUNTER — Encounter: Payer: Self-pay | Admitting: Internal Medicine

## 2021-06-13 DIAGNOSIS — R972 Elevated prostate specific antigen [PSA]: Secondary | ICD-10-CM

## 2021-06-14 ENCOUNTER — Other Ambulatory Visit: Payer: Self-pay | Admitting: Internal Medicine

## 2021-06-14 ENCOUNTER — Encounter: Payer: Self-pay | Admitting: Internal Medicine

## 2021-06-14 LAB — TB SKIN TEST
Induration: 0 mm
TB Skin Test: NEGATIVE

## 2021-06-14 MED ORDER — LISINOPRIL 20 MG PO TABS: ORAL_TABLET | ORAL | 3 refills | Status: DC

## 2021-06-21 ENCOUNTER — Other Ambulatory Visit: Payer: Self-pay | Admitting: Internal Medicine

## 2021-06-21 DIAGNOSIS — F988 Other specified behavioral and emotional disorders with onset usually occurring in childhood and adolescence: Secondary | ICD-10-CM

## 2021-06-21 MED ORDER — AMPHETAMINE-DEXTROAMPHETAMINE 20 MG PO TABS: ORAL_TABLET | ORAL | 0 refills | Status: DC

## 2021-07-05 ENCOUNTER — Other Ambulatory Visit: Payer: Self-pay | Admitting: Internal Medicine

## 2021-07-05 ENCOUNTER — Encounter: Payer: Self-pay | Admitting: Internal Medicine

## 2021-07-05 DIAGNOSIS — Z1211 Encounter for screening for malignant neoplasm of colon: Secondary | ICD-10-CM

## 2021-07-29 ENCOUNTER — Other Ambulatory Visit: Payer: Self-pay | Admitting: Internal Medicine

## 2021-07-29 DIAGNOSIS — E349 Endocrine disorder, unspecified: Secondary | ICD-10-CM

## 2021-07-29 MED ORDER — TESTOSTERONE CYPIONATE 200 MG/ML IM SOLN: INTRAMUSCULAR | 1 refills | Status: DC

## 2021-08-11 LAB — COLOGUARD: COLOGUARD: NEGATIVE

## 2021-08-11 NOTE — Progress Notes (Signed)
<><><><><><><><><><><><><><><><><><><><><><><><><><><><><><><><><> ?<><><><><><><><><><><><><><><><><><><><><><><><><><><><><><><><><> ? ?-   Cologard  - Negative  - Great !                                                           -   Recommend repeat in 3 years !  <><><><><><><><><><><><><><><><><><><><><><><><><><><><><><><><><> <><><><><><><><><><><><><><><><><><><><><><><><><><><><><><><><><>  

## 2021-08-13 ENCOUNTER — Encounter: Payer: Self-pay | Admitting: Internal Medicine

## 2021-08-14 ENCOUNTER — Other Ambulatory Visit: Payer: Self-pay | Admitting: Internal Medicine

## 2021-08-14 DIAGNOSIS — E782 Mixed hyperlipidemia: Secondary | ICD-10-CM

## 2021-08-14 DIAGNOSIS — M5441 Lumbago with sciatica, right side: Secondary | ICD-10-CM

## 2021-08-14 MED ORDER — ROSUVASTATIN CALCIUM 5 MG PO TABS: ORAL_TABLET | ORAL | 3 refills | Status: DC

## 2021-08-14 MED ORDER — TRAMADOL HCL 50 MG PO TABS: ORAL_TABLET | ORAL | 0 refills | Status: AC

## 2021-09-22 ENCOUNTER — Ambulatory Visit: Payer: 59 | Admitting: Nurse Practitioner

## 2021-11-11 ENCOUNTER — Other Ambulatory Visit: Payer: Self-pay | Admitting: Internal Medicine

## 2021-11-11 ENCOUNTER — Encounter: Payer: Self-pay | Admitting: Internal Medicine

## 2021-11-11 MED ORDER — DEXAMETHASONE 4 MG PO TABS: ORAL_TABLET | ORAL | 1 refills | Status: DC

## 2021-12-14 ENCOUNTER — Other Ambulatory Visit: Payer: Self-pay | Admitting: Internal Medicine

## 2021-12-14 ENCOUNTER — Encounter: Payer: Self-pay | Admitting: Internal Medicine

## 2021-12-14 ENCOUNTER — Other Ambulatory Visit: Payer: Self-pay | Admitting: Nurse Practitioner

## 2021-12-14 DIAGNOSIS — M5441 Lumbago with sciatica, right side: Secondary | ICD-10-CM

## 2021-12-14 MED ORDER — BUTALBITAL-APAP-CAFFEINE 50-325-40 MG PO TABS: ORAL_TABLET | ORAL | 0 refills | Status: DC

## 2021-12-20 ENCOUNTER — Ambulatory Visit: Payer: 59 | Admitting: Nurse Practitioner

## 2021-12-20 VITALS — BP 128/80 | HR 84 | Wt 178.4 lb

## 2021-12-20 DIAGNOSIS — R972 Elevated prostate specific antigen [PSA]: Secondary | ICD-10-CM

## 2021-12-20 DIAGNOSIS — I10 Essential (primary) hypertension: Secondary | ICD-10-CM

## 2021-12-20 DIAGNOSIS — F988 Other specified behavioral and emotional disorders with onset usually occurring in childhood and adolescence: Secondary | ICD-10-CM

## 2021-12-20 DIAGNOSIS — G4733 Obstructive sleep apnea (adult) (pediatric): Secondary | ICD-10-CM | POA: Diagnosis not present

## 2021-12-20 DIAGNOSIS — M5136 Other intervertebral disc degeneration, lumbar region: Secondary | ICD-10-CM

## 2021-12-20 DIAGNOSIS — M51369 Other intervertebral disc degeneration, lumbar region without mention of lumbar back pain or lower extremity pain: Secondary | ICD-10-CM

## 2021-12-20 DIAGNOSIS — M67911 Unspecified disorder of synovium and tendon, right shoulder: Secondary | ICD-10-CM

## 2021-12-20 DIAGNOSIS — E663 Overweight: Secondary | ICD-10-CM

## 2021-12-20 DIAGNOSIS — E8881 Metabolic syndrome: Secondary | ICD-10-CM | POA: Diagnosis not present

## 2021-12-20 DIAGNOSIS — E559 Vitamin D deficiency, unspecified: Secondary | ICD-10-CM

## 2021-12-20 DIAGNOSIS — E782 Mixed hyperlipidemia: Secondary | ICD-10-CM

## 2021-12-20 DIAGNOSIS — E349 Endocrine disorder, unspecified: Secondary | ICD-10-CM

## 2021-12-20 DIAGNOSIS — Z79899 Other long term (current) drug therapy: Secondary | ICD-10-CM

## 2021-12-20 DIAGNOSIS — E88819 Insulin resistance, unspecified: Secondary | ICD-10-CM

## 2021-12-20 MED ORDER — TESTOSTERONE CYPIONATE 200 MG/ML IM SOLN: INTRAMUSCULAR | 1 refills | Status: DC

## 2021-12-20 MED ORDER — PREGABALIN 75 MG PO CAPS: ORAL_CAPSULE | ORAL | 0 refills | Status: DC

## 2021-12-20 NOTE — Progress Notes (Signed)
FOLLOW UP  Assessment and Plan:   1. Essential hypertension Discussed DASH (Dietary Approaches to Stop Hypertension) DASH diet is lower in sodium than a typical American diet. Cut back on foods that are high in saturated fat, cholesterol, and trans fats. Eat more whole-grain foods, fish, poultry, and nuts Remain active and exercise as tolerated daily.  Monitor BP at home-Call if greater than 130/80.  Check and monitor CMP/CBC   2. OSA (obstructive sleep apnea) Resolved d/t weight loss.  3. Insulin resistance / Prediabetes Education: Reviewed 'ABCs' of diabetes management  Discussed goals to be met and/or maintained include A1C (<7) Blood pressure (<130/80) Cholesterol (LDL <70) Continue Eye Exam yearly  Continue Dental Exam Q6 mo Discussed dietary recommendations Discussed Physical Activity recommendations Foot exam UTD Check and monitor A1C   4. DDD (degenerative disc disease), lumbar Continue to follow with Ortho PRN Pregabalin PRN. RICE when flared.  5. Rotator cuff disorder, right Continue to follow with Ortho PRN Pregabalin PRN. RICE when flared.  6. Hyperlipidemia, mixed Discussed lifestyle modifications. Recommended diet heavy in fruits and veggies, omega 3's. Decrease consumption of animal meats, cheeses, and dairy products. Remain active and exercise as tolerated. Continue to monitor. Check and monitor lipids/TSH   7. Testosterone deficiency Continue Testosterone injections. Monitor testosterone levels Monitor PSA, LFTs, lipids.  8. Vitamin D deficiency Continue supplement. Monitor levels  9. Medication management All medications discussed and reviewed in full. All questions and concerns regarding medications addressed.     10. Overweight (BMI 25.0-29.9) Continue lifestyle modifications. Focus on consuming a diet heavy in fruits and veggies, omega 3's. Decrease consumption of animal meats, cheeses, and dairy products. Remain active and  incorporate daily tolerated exercise. Continue to monitor weight loss.   11. Attention deficit disorder (ADD) without hyperactivity Continue PRN.  Continue to monitor  Orders Placed This Encounter  Procedures   CBC with Differential/Platelet   COMPLETE METABOLIC PANEL WITH GFR   Lipid panel   Hemoglobin A1c   VITAMIN D 25 Hydroxy (Vit-D Deficiency, Fractures)   Testosterone   PSA   Meds ordered this encounter  Medications   pregabalin (LYRICA) 75 MG capsule    Sig: Take 1 capsule 2 x /day for pain    Dispense:  180 capsule    Refill:  0    Order Specific Question:   Supervising Provider    Answer:   [name] [phone]   testosterone cypionate (DEPOTESTOSTERONE CYPIONATE) 200 MG/ML injection    Sig: Inject  1  ml  into muscle   every 7 days    Dispense:  10 mL    Refill:  1    Order Specific Question:   Supervising Provider    Answer:   [name] [phone]    Continue diet and meds as discussed. Further disposition pending results of labs. Discussed med's effects and SE's.    Over 20 minutes of exam, counseling, chart review, and critical decision making was performed.   Future Appointments  Date Time Provider [place]  06/15/2022  2:00 PM [name], MD [facility] None    ----------------------------------------------------------------------------------------------------------------------  HPI 61 y.o. male  presents for 3 month follow up on hypertension, cholesterol, diabetes, weight and vitamin D deficiency.   States that he has been Oscar Moore, Oscar Moore.  He has been moving and feels as though he re-injured his right shoulder.  Has had a right rotator cuff tear in the past without surgical intervention.  He is continuing oto have  intermittent sharp shooting pain around his shoulder joint, worse if sleeping on that side at night.  Has been taking Lyrica PRN with improvement.    BMI is Body mass index is 26.73 kg/m., he has  been working on diet and exercise. Wt Readings from Last 3 Encounters:  12/20/21 178 lb 6.4 oz (80.9 kg)  06/09/21 187 lb 9.6 oz (85.1 kg)  12/01/20 190 lb 6.4 oz (86.4 kg)    His blood pressure has been controlled at home, today their BP is BP: 128/80  He does workout constructing his new home. He denies chest pain, shortness of breath, dizziness.   He is on cholesterol medication Rosuvastatin and denies myalgias. His cholesterol is not at goal. The cholesterol last visit was:   Lab Results  Component Value Date   CHOL 182 06/09/2021   HDL 49 06/09/2021   LDLCALC 107 (H) 06/09/2021   TRIG 143 06/09/2021   CHOLHDL 3.7 06/09/2021    He has been working on diet and exercise for prediabetes, and denies polydipsia and polyuria. Last A1C in the office was:  Lab Results  Component Value Date   HGBA1C 5.6 06/09/2021   Patient is on Vitamin D supplement.   Lab Results  Component Value Date   VD25OH 72 06/09/2021     He is on testosterone injections weekly. Last testosterone 646 on 06/09/21   Current Medications:  Current Outpatient Medications on File Prior to Visit  Medication Sig   amphetamine-dextroamphetamine (ADDERALL) 20 MG tablet Take 1/2 to 1 tablet 1 or 2 x /day for Focus & Concentration   butalbital-acetaminophen-caffeine (FIORICET) 50-325-40 MG tablet TAKE 1 TO 2 TABLETS 4 TIMES A DAY ONLY IF NEEDED FOR SEVERE HEADACHE   lisinopril (ZESTRIL) 20 MG tablet Take 1 tablet Daily for BP   omeprazole (PRILOSEC) 40 MG capsule TAKE 1 CAPSULE BY MOUTH DAILY AT SUPPER TIME FOR STOMACH ACID OR REFLUX   rosuvastatin (CRESTOR) 5 MG tablet Take  1 tablet  Daily for Cholesterol                                                  /                                TAKE                      BY                  MOUTH   SYRINGE-NEEDLE, DISP, 3 ML (B-D 3CC LUER-LOK SYR 22GX1") 22G X 1" 3 ML MISC For Testosterone injections   traMADol (ULTRAM) 50 MG tablet Take 1 tablet every 4 hours ONLY if needed  for SEVERE pain   dexamethasone (DECADRON) 4 MG tablet Take  1 tab   3 x day  for Allergic Reaction   No current facility-administered medications on file prior to visit.     Allergies: No Known Allergies   Medical History:  Past Medical History:  Diagnosis Date   Allergy    Hyperlipidemia    Hypertension    OSA (obstructive sleep apnea)    Family history- Reviewed and unchanged Social history- Reviewed and unchanged   Review of Systems: All review systems reviewed and negative except for pertinent  positives in history of present illness.    Physical Exam: BP 128/80   Pulse 84   Wt 178 lb 6.4 oz (80.9 kg)   SpO2 98%   BMI 26.73 kg/m  Wt Readings from Last 3 Encounters:  12/20/21 178 lb 6.4 oz (80.9 kg)  06/09/21 187 lb 9.6 oz (85.1 kg)  12/01/20 190 lb 6.4 oz (86.4 kg)   General Appearance: Well nourished, in no apparent distress. Eyes: PERRLA, EOMs, conjunctiva no swelling or erythema Sinuses: No Frontal/maxillary tenderness ENT/Mouth: Ext aud canals clear, TMs without erythema, bulging. No erythema, swelling, or exudate on post pharynx.  Tonsils not swollen or erythematous. Hearing normal.  Neck: Supple, thyroid normal.  Respiratory: Respiratory effort normal, BS equal bilaterally without rales, rhonchi, wheezing or stridor.  Cardio: RRR with no MRGs. Brisk peripheral pulses without edema.  Abdomen: Soft, + BS.  Non tender, no guarding, rebound, hernias, masses. Lymphatics: Non tender without lymphadenopathy.  Musculoskeletal: Full ROM, 5/5 strength, Normal gait Skin: Warm, dry without rashes, lesions, ecchymosis.  Neuro: Cranial nerves intact. No cerebellar symptoms.  Psych: Awake and oriented X 3, normal affect, Insight and Judgment appropriate.    Darrol Jump, NP 12:11 PM [facility]

## 2021-12-20 NOTE — Patient Instructions (Signed)
Rotator Cuff Tear  A rotator cuff tear is a partial or complete tear of the cord-like bands (tendons) that connect muscle to bone in the rotator cuff. The rotator cuff is a group of muscles and tendons that surround the shoulder joint and keep the upper arm bone (humerus) in the shoulder socket. The tear can occur suddenly (acute tear) or can develop over a long period of time (chronic tear). What are the causes? Acute tears may be caused by: A fall, especially on an outstretched arm. Lifting very heavy objects with a jerking motion. Chronic tears may be caused by overuse of the muscles. This may happen in sports, physical work, or activities in which your arm repeatedly moves over your head. What increases the risk? This condition is more likely to occur in: Athletes and workers who frequently use their shoulder or reach over their head. This may include activities such as: Tennis. Baseball and softball. Swimming and rowing. Weight lifting. Construction work. Painting. People who smoke. Older people who have arthritis or poor blood supply. These can make the muscles and tendons weaker. What are the signs or symptoms? Symptoms of this condition depend on the type and severity of the injury: An acute tear may include a sudden tearing feeling, followed by severe pain that goes from your upper shoulder, down your arm, and toward your elbow. A chronic tear includes a gradual weakness and decreased shoulder motion as the pain gets worse. The pain is usually worse at night. Both types may have symptoms such as: Pain that spreads (radiates) from the shoulder to the upper arm. Swelling and tenderness in front of the shoulder. Decreased range of motion. Pain when: Reaching, pulling, or lifting the arm above the head. Lowering the arm from above the head. Not being able to raise your arm out to the side. Difficulty placing the arm behind your back. How is this diagnosed? This condition is  diagnosed with a medical history and physical exam. Imaging tests may also be done, including: X-rays. MRI. Ultrasound. CT or MR arthrogram. During this test, a contrast material is injected into your shoulder, and then images are taken. How is this treated? Treatment for this condition depends on the type and severity of the condition. In less severe cases, treatment may include: Rest. This may be done with a sling that holds the shoulder still (immobilization). Your health care provider may also recommend avoiding activities that involve lifting your arm over your head. Icing the shoulder. Anti-inflammatory medicines, such as aspirin or ibuprofen. Strengthening and stretching exercises. Your health care provider may recommend specific exercises to improve your range of motion and strengthen your shoulder. In more severe cases, treatment may include: Physical therapy. Steroid injections. Surgery. Follow these instructions at home: Managing pain, stiffness, and swelling     If directed, put ice on the injured area. To do this: If you have a removable sling, remove it as told by your health care provider. Put ice in a plastic bag. Place a towel between your skin and the bag. Leave the ice on for 20 minutes, 2-3 times a day. Remove the ice if your skin turns bright red. This is very important. If you cannot feel pain, heat, or cold, you have a greater risk of damage to the area. Raise (elevate) the injured area above the level of your heart while you are lying down. Find a comfortable sleeping position or sleep on a recliner, if available. Move your fingers often to reduce stiffness and   swelling. Once the swelling has gone down, your health care provider may direct you to apply heat to relax the muscles. Use the heat source that your health care provider recommends, such as a moist heat pack or a heating pad. Place a towel between your skin and the heat source. Leave the heat on for  20-30 minutes. Remove the heat if your skin turns bright red. This is especially important if you are unable to feel pain, heat, or cold. You have a greater risk of getting burned. If you have a sling: Wear the sling as told by your health care provider. Remove it only as told by your health care provider. Loosen the sling if your fingers tingle, become numb, or turn cold and blue. Keep the sling clean. If the sling is not waterproof: Do not let it get wet. Cover it with a watertight covering when you take a bath or a shower. Activity Rest your shoulder as told by your health care provider. Return to your normal activities as told by your health care provider. Ask your health care provider what activities are safe for you. Ask your health care provider when it is safe to drive if you have a sling on your arm. Do any exercises or stretches as told by your health care provider. General instructions Take over-the-counter and prescription medicines only as told by your health care provider. Do not use any products that contain nicotine or tobacco, such as cigarettes, e-cigarettes, and chewing tobacco. If you need help quitting, ask your health care provider. Keep all follow-up visits. This is important. Contact a health care provider if: Your pain gets worse. You have new pain in your arm, hands, or fingers. Medicine does not help your pain. Get help right away if: Your arm, hand, or fingers are numb or tingling. Your arm, hand, or fingers are swollen or painful or they turn white or blue. Your hand or fingers on your injured arm are colder than your other hand. Summary A rotator cuff tear is a partial or complete tear of the cord-like bands (tendons) that connect muscle to bone in the rotator cuff. The tear can occur suddenly (acute tear) or can develop over a long period of time (chronic tear). Treatment generally includes rest, anti-inflammatory medicines, and icing. In some cases,  physical therapy and steroid injections may be needed. In severe cases, surgery may be needed. This information is not intended to replace advice given to you by your health care provider. Make sure you discuss any questions you have with your health care provider. Document Revised: 07/31/2019 Document Reviewed: 07/31/2019 Elsevier Patient Education  2023 [employer] Inc[employer]  

## 2021-12-21 LAB — COMPLETE METABOLIC PANEL WITH GFR
AG Ratio: 1.8 (calc) (ref 1.0–2.5)
ALT: 24 U/L (ref 9–46)
AST: 21 U/L (ref 10–35)
Albumin: 4.6 g/dL (ref 3.6–5.1)
Alkaline phosphatase (APISO): 53 U/L (ref 35–144)
BUN: 10 mg/dL (ref 7–25)
CO2: 29 mmol/L (ref 20–32)
Calcium: 10 mg/dL (ref 8.6–10.3)
Chloride: 102 mmol/L (ref 98–110)
Creat: 1.05 mg/dL (ref 0.70–1.35)
Globulin: 2.5 g/dL (calc) (ref 1.9–3.7)
Glucose, Bld: 86 mg/dL (ref 65–99)
Potassium: 4.9 mmol/L (ref 3.5–5.3)
Sodium: 140 mmol/L (ref 135–146)
Total Bilirubin: 0.6 mg/dL (ref 0.2–1.2)
Total Protein: 7.1 g/dL (ref 6.1–8.1)
eGFR: 81 mL/min/{1.73_m2} (ref >=60)

## 2021-12-21 LAB — CBC WITH DIFFERENTIAL/PLATELET
Absolute Monocytes: 481 cells/uL (ref 200–950)
Basophils Absolute: 59 cells/uL (ref 0–200)
Basophils Relative: 0.9 %
Eosinophils Absolute: 52 cells/uL (ref 15–500)
Eosinophils Relative: 0.8 %
HCT: 49.4 % (ref 38.5–50.0)
Hemoglobin: 15.6 g/dL (ref 13.2–17.1)
Lymphs Abs: 1118 cells/uL (ref 850–3900)
MCH: 25.4 pg — ABNORMAL LOW (ref 27.0–33.0)
MCHC: 31.6 g/dL — ABNORMAL LOW (ref 32.0–36.0)
MCV: 80.3 fL (ref 80.0–100.0)
MPV: 9.2 fL (ref 7.5–12.5)
Monocytes Relative: 7.4 %
Neutro Abs: 4791 cells/uL (ref 1500–7800)
Neutrophils Relative %: 73.7 %
Platelets: 359 10*3/uL (ref 140–400)
RBC: 6.15 10*6/uL — ABNORMAL HIGH (ref 4.20–5.80)
RDW: 14.8 % (ref 11.0–15.0)
Total Lymphocyte: 17.2 %
WBC: 6.5 10*3/uL (ref 3.8–10.8)

## 2021-12-21 LAB — LIPID PANEL
Cholesterol: 160 mg/dL (ref <200)
HDL: 48 mg/dL (ref >=40)
LDL Cholesterol (Calc): 94 mg/dL (calc)
Non-HDL Cholesterol (Calc): 112 mg/dL (calc) (ref <130)
Total CHOL/HDL Ratio: 3.3 (calc) (ref <5.0)
Triglycerides: 88 mg/dL (ref <150)

## 2021-12-21 LAB — TESTOSTERONE: Testosterone: 723 ng/dL (ref 250–827)

## 2021-12-21 LAB — VITAMIN D 25 HYDROXY (VIT D DEFICIENCY, FRACTURES): Vit D, 25-Hydroxy: 66 ng/mL (ref 30–100)

## 2021-12-21 LAB — PSA: PSA: 4.52 ng/mL — ABNORMAL HIGH (ref <=4.00)

## 2021-12-21 LAB — HEMOGLOBIN A1C
Hgb A1c MFr Bld: 5.2 % of total Hgb (ref <5.7)
Mean Plasma Glucose: 103 mg/dL
eAG (mmol/L): 5.7 mmol/L

## 2022-01-04 ENCOUNTER — Ambulatory Visit: Payer: 59 | Admitting: Internal Medicine

## 2022-03-28 ENCOUNTER — Encounter: Payer: Self-pay | Admitting: Internal Medicine

## 2022-03-28 DIAGNOSIS — K219 Gastro-esophageal reflux disease without esophagitis: Secondary | ICD-10-CM

## 2022-03-28 MED ORDER — OMEPRAZOLE 40 MG PO CPDR: DELAYED_RELEASE_CAPSULE | ORAL | 3 refills | Status: AC

## 2022-04-15 ENCOUNTER — Other Ambulatory Visit: Payer: Self-pay | Admitting: Nurse Practitioner

## 2022-04-15 DIAGNOSIS — F988 Other specified behavioral and emotional disorders with onset usually occurring in childhood and adolescence: Secondary | ICD-10-CM

## 2022-04-15 MED ORDER — AMPHETAMINE-DEXTROAMPHETAMINE 20 MG PO TABS: ORAL_TABLET | ORAL | 0 refills | Status: DC

## 2022-04-21 ENCOUNTER — Encounter: Payer: Self-pay | Admitting: Internal Medicine

## 2022-04-27 ENCOUNTER — Encounter: Payer: Self-pay | Admitting: Internal Medicine

## 2022-06-14 ENCOUNTER — Encounter: Payer: Self-pay | Admitting: Internal Medicine

## 2022-06-14 NOTE — Patient Instructions (Signed)

## 2022-06-14 NOTE — Progress Notes (Unsigned)
Annual  Screening/Preventative Visit  & Comprehensive Evaluation & Examination   Future Appointments  Date Time Provider Department  06/15/2022                         cpe   2:00 PM [name], MD [facility]  06/20/2023                        cpe  2:00 PM [name], MD [facility]            This very nice 62 y.o.  MWM presents for a Screening /Preventative Visit & comprehensive evaluation and management of multiple medical co-morbidities.  Patient has been followed for HTN, HLD, Prediabetes and Vitamin D Deficiency.  Patient is on CPAP with improved sleep hygiene. Patient has GERD controlled on his meds.         HTN predates circa  2007. Patient's BP has been controlled at home.  Today's BP was initially sl elevated and rechecked at goal -                            .  Patient denies any cardiac symptoms as chest pain, palpitations, shortness of breath, dizziness or ankle swelling.       Patient's hyperlipidemia is controlled with diet and Rosuvastatin. Patient denies myalgias or other medication SE's. Last lipids were at goal :  Lab Results  Component Value Date   CHOL 160 12/20/2021   HDL 48 12/20/2021   LDLCALC 94 12/20/2021   TRIG 88 12/20/2021   CHOLHDL 3.3 12/20/2021         Patient has hx/o prediabetes & Insulin Resistance  (A1c 5.4% w/Insulin 45 /2015)   resolved with improved diet & weight loss.   Patient denies reactive hypoglycemic symptoms, visual blurring, diabetic polys or paresthesias. Last A1c was normal & at goal :   Lab Results  Component Value Date   HGBA1C 5.2 12/20/2021                                                       Patient has hx/o Low Testosterone and has been on parenteral replacement with improved stamina & sense of well-being.                       Finally, patient has history of Vitamin D Deficiency &  last vitamin D was at goal :   Lab Results  Component Value Date   VD25OH 66 12/20/2021       Current Outpatient  Medications  Medication Instructions   ADDERALL 20 MG tablet Take 1/2 to 1 tablet 1 or 2 x /day for Focus & Concentration   FIORICET 50-325-40 MG  TAKE 1 TO 2 TABS 4 x/ DAY ONLY IF NEEDED    lisinopril 20 MG tablet Take 1 tablet Daily for BP   omeprazole  40 MG capsule TAKE 1 CAPSULE DAILY    pregabalin  75 MG capsule Take 1 capsule 2 x /day for pain   rosuvastatin  5 MG tablet Take  1 tablet  Daily   testosterone cypio 200 mg/ml inj Inject  1  ml  into muscle   every 7 days  traMADol  50 MG tablet Take 1 tablet every 4 hours ONLY if needed      No Known Allergies   Past Medical History:  Diagnosis Date   Allergy    Hyperlipidemia    Hypertension    OSA (obstructive sleep apnea)      Health Maintenance  Topic Date Due   Zoster Vaccines- Shingrix (1 of 2) Never done   INFLUENZA VACCINE  11/09/2020   TETANUS/TDAP  10/26/2023   Hepatitis C Screening  Completed   HIV Screening  Completed   HPV VACCINES  Aged Out     Immunization History  Administered Date(s) Administered   Influenza Inj Mdck Quad  01/03/2018, 02/28/2020   Influenza,inj,Quad  01/30/2017, 02/10/2019   Influenza,inj,quad 01/29/2016   Influenza 01/30/2017   PPD Test 04/13/2018, 05/22/2019, 06/02/2020   Pneumococcal-23 12/22/2014   Tdap 10/25/2013    Last Colon - 03/21/2012 - Dr Lizbeth Bark - recc a 5 yr f/u - due Jan 2019 - Overdue    Past Surgical History:  Procedure Laterality Date   APPENDECTOMY  1983   HAND SURGERY Right 2007     Family History  Problem Relation Age of Onset   Cancer Father        SCC- head and neck     Social History   Socioeconomic History   Marital status: Married      Spouse name: Baldo Ash   Number of children: 2 daughters by 1st marriage ( 29 & 27 yo)   Occupational History   Retired Engineer, 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 Does c/o fatigue. Eyes: Denies redness, blurred vision, diplopia, discharge, itchy or watery eyes.  ENT: Denies discharge, congestion, post nasal drip, epistaxis, sore throat, earache, hearing loss, dental pain, Tinnitus, Vertigo, Sinus pain or snoring.  Cardio: Denies chest pain, palpitations, irregular heartbeat, syncope, dyspnea, diaphoresis, orthopnea, PND, claudication or edema Respiratory: denies cough, dyspnea, DOE, pleurisy, hoarseness, laryngitis or wheezing.  Gastrointestinal: Denies dysphagia, heartburn, reflux, water brash, pain, cramps, nausea, vomiting, bloating, diarrhea, constipation, hematemesis, melena, hematochezia, jaundice or hemorrhoids Genitourinary: Denies dysuria, frequency, urgency, nocturia, hesitancy, discharge, hematuria or flank pain Musculoskeletal: Denies arthralgia, myalgia, stiffness, Jt. Swelling, pain, limp or strain/sprain. Denies Falls. Skin: Denies puritis, rash, hives, warts, acne, eczema or change in skin lesion Neuro: No weakness, tremor, incoordination, spasms, paresthesia or pain Psychiatric: Denies confusion, memory loss or sensory loss. Denies Depression. Endocrine: Denies change in weight, skin, hair change, nocturia, and paresthesia, diabetic polys, visual blurring or hyper / hypo glycemic episodes.  Heme/Lymph: No excessive bleeding, bruising or enlarged lymph nodes.   Physical Exam  There were no vitals taken for this visit.  General Appearance: Well nourished and well groomed and in no apparent distress.  Eyes: PERRLA, EOMs, conjunctiva no swelling or erythema, normal fundi and vessels. Sinuses: No frontal/maxillary tenderness ENT/Mouth: EACs patent / TMs  nl. Nares clear without erythema, swelling, mucoid exudates. Oral hygiene is  good. No erythema, swelling, or exudate. Tongue normal, non-obstructing. Tonsils not swollen or erythematous. Hearing normal.  Neck: Supple, thyroid not palpable. No bruits, nodes or JVD.  Respiratory: Respiratory effort normal.  BS equal and clear bilateral without rales, rhonci, wheezing or stridor. Cardio: Heart sounds are normal with regular rate and rhythm and no murmurs, rubs or gallops. Peripheral pulses are normal and equal bilaterally without edema. No aortic or femoral bruits. Chest: symmetric with normal excursions and percussion.  Abdomen: Soft, with Nl bowel sounds. Nontender, no guarding, rebound, hernias, masses, or organomegaly.  Lymphatics: Non tender without lymphadenopathy.  Musculoskeletal: Full ROM all peripheral extremities, joint stability, 5/5 strength, and normal gait. Skin: Warm and dry without rashes, lesions, cyanosis, clubbing or  ecchymosis.  Neuro: Cranial nerves intact, reflexes equal bilaterally. Normal muscle tone, no cerebellar symptoms. Sensation intact.  Pysch: Alert and oriented X 3 with normal affect, insight and judgment appropriate.   Assessment and Plan  1. Annual Preventative/Screening Exam    2. Essential hypertension   - EKG 12-Lead - [place], RETROPERITNL ABD,  LTD - Urinalysis, Routine w reflex microscopic - Microalbumin / creatinine urine ratio - CBC with Differential/Platelet - COMPLETE METABOLIC PANEL WITH GFR - Magnesium  3. Hyperlipidemia, mixed  - EKG 12-Lead - [place], RETROPERITNL ABD,  LTD - Lipid panel - TSH  4. Abnormal glucose  - EKG 12-Lead - [place], RETROPERITNL ABD,  LTD - Hemoglobin A1c - Insulin, random  5. Vitamin D deficiency  - VITAMIN D 25 Hydroxy   6. Hypothyroidism, unspecified type  - TSH  7. Insulin resistance / Prediabetes  - EKG 12-Lead - [place], RETROPERITNL ABD,  LTD - Hemoglobin A1c - Insulin, random  8. Testosterone deficiency  - Testosterone  9. 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 OSA on CPAP   12. Prostate cancer screening  - PSA  13. Screening examination for pulmonary tuberculosis  - TB Skin Test  14. Screening for colorectal cancer  - POC Hemoccult Bld/Stl   15. Screening for ischemic heart disease  - EKG 12-Lead  16. FH: hypertension  - EKG 12-Lead - [place], RETROPERITNL ABD,  LTD  17. Screening for AAA (aortic abdominal aneurysm)  - [place], RETROPERITNL ABD,  LTD - Iron, Total/Total Iron Binding Cap - Vitamin B12 - CBC with Differential/Platelet - TSH  18. Medication management  - Urinalysis, Routine w reflex microscopic - Microalbumin / creatinine urine ratio - Testosterone - CBC with Differential/Platelet - COMPLETE METABOLIC PANEL WITH GFR - Magnesium - Lipid panel - TSH - Hemoglobin A1c - Insulin, random - VITAMIN D 25 Hydroxy         Patient was counseled in prudent diet, weight control to achieve/maintain BMI less than 25, BP monitoring, regular exercise and medications as discussed.  Discussed med effects and SE's. Routine screening labs and tests as requested with regular follow-up as recommended. Over 40 minutes of exam, counseling, chart review and high complex critical decision making was performed   Kirtland Bouchard, MDm

## 2022-06-15 ENCOUNTER — Ambulatory Visit: Payer: 59 | Admitting: Internal Medicine

## 2022-06-15 ENCOUNTER — Encounter: Payer: Self-pay | Admitting: Internal Medicine

## 2022-06-15 VITALS — BP 160/98 | HR 90 | Temp 98.0°F | Resp 17 | Ht 68.5 in | Wt 185.2 lb

## 2022-06-15 DIAGNOSIS — E559 Vitamin D deficiency, unspecified: Secondary | ICD-10-CM

## 2022-06-15 DIAGNOSIS — R7309 Other abnormal glucose: Secondary | ICD-10-CM

## 2022-06-15 DIAGNOSIS — Z79899 Other long term (current) drug therapy: Secondary | ICD-10-CM

## 2022-06-15 DIAGNOSIS — E782 Mixed hyperlipidemia: Secondary | ICD-10-CM

## 2022-06-15 DIAGNOSIS — Z125 Encounter for screening for malignant neoplasm of prostate: Secondary | ICD-10-CM

## 2022-06-15 DIAGNOSIS — Z8249 Family history of ischemic heart disease and other diseases of the circulatory system: Secondary | ICD-10-CM

## 2022-06-15 DIAGNOSIS — Z0001 Encounter for general adult medical examination with abnormal findings: Secondary | ICD-10-CM

## 2022-06-15 DIAGNOSIS — F988 Other specified behavioral and emotional disorders with onset usually occurring in childhood and adolescence: Secondary | ICD-10-CM

## 2022-06-15 DIAGNOSIS — E349 Endocrine disorder, unspecified: Secondary | ICD-10-CM

## 2022-06-15 DIAGNOSIS — Z111 Encounter for screening for respiratory tuberculosis: Secondary | ICD-10-CM | POA: Diagnosis not present

## 2022-06-15 DIAGNOSIS — I10 Essential (primary) hypertension: Secondary | ICD-10-CM

## 2022-06-15 DIAGNOSIS — Z0000 Encounter for general adult medical examination without abnormal findings: Secondary | ICD-10-CM | POA: Diagnosis not present

## 2022-06-15 DIAGNOSIS — Z136 Encounter for screening for cardiovascular disorders: Secondary | ICD-10-CM | POA: Diagnosis not present

## 2022-06-15 DIAGNOSIS — I700 Atherosclerosis of aorta: Secondary | ICD-10-CM | POA: Diagnosis not present

## 2022-06-15 DIAGNOSIS — K219 Gastro-esophageal reflux disease without esophagitis: Secondary | ICD-10-CM

## 2022-06-15 DIAGNOSIS — E039 Hypothyroidism, unspecified: Secondary | ICD-10-CM

## 2022-06-15 DIAGNOSIS — Z1211 Encounter for screening for malignant neoplasm of colon: Secondary | ICD-10-CM

## 2022-06-15 DIAGNOSIS — G4733 Obstructive sleep apnea (adult) (pediatric): Secondary | ICD-10-CM

## 2022-06-15 DIAGNOSIS — E88819 Insulin resistance, unspecified: Secondary | ICD-10-CM

## 2022-06-15 DIAGNOSIS — N401 Enlarged prostate with lower urinary tract symptoms: Secondary | ICD-10-CM

## 2022-06-15 DIAGNOSIS — R5383 Other fatigue: Secondary | ICD-10-CM

## 2022-06-15 MED ORDER — FINASTERIDE 5 MG PO TABS: ORAL_TABLET | ORAL | 3 refills | Status: AC

## 2022-06-15 MED ORDER — DOXAZOSIN MESYLATE 8 MG PO TABS: ORAL_TABLET | ORAL | 3 refills | Status: AC

## 2022-06-16 ENCOUNTER — Other Ambulatory Visit: Payer: Self-pay | Admitting: Internal Medicine

## 2022-06-16 ENCOUNTER — Encounter: Payer: Self-pay | Admitting: Internal Medicine

## 2022-06-16 LAB — LIPID PANEL
Cholesterol: 155 mg/dL (ref <200)
HDL: 40 mg/dL (ref >=40)
LDL Cholesterol (Calc): 87 mg/dL (calc)
Non-HDL Cholesterol (Calc): 115 mg/dL (calc) (ref <130)
Total CHOL/HDL Ratio: 3.9 (calc) (ref <5.0)
Triglycerides: 183 mg/dL — ABNORMAL HIGH (ref <150)

## 2022-06-16 LAB — CBC WITH DIFFERENTIAL/PLATELET
Absolute Monocytes: 488 cells/uL (ref 200–950)
Basophils Absolute: 90 cells/uL (ref 0–200)
Basophils Relative: 1.2 %
Eosinophils Absolute: 180 cells/uL (ref 15–500)
Eosinophils Relative: 2.4 %
HCT: 48.3 % (ref 38.5–50.0)
Hemoglobin: 15.4 g/dL (ref 13.2–17.1)
Lymphs Abs: 1328 cells/uL (ref 850–3900)
MCH: 24.6 pg — ABNORMAL LOW (ref 27.0–33.0)
MCHC: 31.9 g/dL — ABNORMAL LOW (ref 32.0–36.0)
MCV: 77 fL — ABNORMAL LOW (ref 80.0–100.0)
MPV: 9.7 fL (ref 7.5–12.5)
Monocytes Relative: 6.5 %
Neutro Abs: 5415 cells/uL (ref 1500–7800)
Neutrophils Relative %: 72.2 %
Platelets: 375 10*3/uL (ref 140–400)
RBC: 6.27 10*6/uL — ABNORMAL HIGH (ref 4.20–5.80)
RDW: 14.3 % (ref 11.0–15.0)
Total Lymphocyte: 17.7 %
WBC: 7.5 10*3/uL (ref 3.8–10.8)

## 2022-06-16 LAB — MAGNESIUM: Magnesium: 2.1 mg/dL (ref 1.5–2.5)

## 2022-06-16 LAB — COMPLETE METABOLIC PANEL WITH GFR
AG Ratio: 1.5 (calc) (ref 1.0–2.5)
ALT: 23 U/L (ref 9–46)
AST: 23 U/L (ref 10–35)
Albumin: 4.3 g/dL (ref 3.6–5.1)
Alkaline phosphatase (APISO): 53 U/L (ref 35–144)
BUN: 12 mg/dL (ref 7–25)
CO2: 28 mmol/L (ref 20–32)
Calcium: 9.4 mg/dL (ref 8.6–10.3)
Chloride: 99 mmol/L (ref 98–110)
Creat: 0.94 mg/dL (ref 0.70–1.35)
Globulin: 2.8 g/dL (calc) (ref 1.9–3.7)
Glucose, Bld: 116 mg/dL — ABNORMAL HIGH (ref 65–99)
Potassium: 4.4 mmol/L (ref 3.5–5.3)
Sodium: 138 mmol/L (ref 135–146)
Total Bilirubin: 0.5 mg/dL (ref 0.2–1.2)
Total Protein: 7.1 g/dL (ref 6.1–8.1)
eGFR: 92 mL/min/{1.73_m2} (ref >=60)

## 2022-06-16 LAB — URINALYSIS, ROUTINE W REFLEX MICROSCOPIC
Bilirubin Urine: NEGATIVE
Glucose, UA: NEGATIVE
Hgb urine dipstick: NEGATIVE
Ketones, ur: NEGATIVE
Leukocytes,Ua: NEGATIVE
Nitrite: NEGATIVE
Protein, ur: NEGATIVE
Specific Gravity, Urine: 1.016 (ref 1.001–1.035)
pH: 6.5 (ref 5.0–8.0)

## 2022-06-16 LAB — TESTOSTERONE: Testosterone: 1208 ng/dL — ABNORMAL HIGH (ref 250–827)

## 2022-06-16 LAB — VITAMIN D 25 HYDROXY (VIT D DEFICIENCY, FRACTURES): Vit D, 25-Hydroxy: 61 ng/mL (ref 30–100)

## 2022-06-16 LAB — TSH: TSH: 5.25 mIU/L — ABNORMAL HIGH (ref 0.40–4.50)

## 2022-06-16 LAB — HEMOGLOBIN A1C
Hgb A1c MFr Bld: 5.7 % of total Hgb — ABNORMAL HIGH (ref <5.7)
Mean Plasma Glucose: 117 mg/dL
eAG (mmol/L): 6.5 mmol/L

## 2022-06-16 LAB — PSA: PSA: 4.84 ng/mL — ABNORMAL HIGH (ref <=4.00)

## 2022-06-16 LAB — INSULIN, RANDOM: Insulin: 39.1 u[IU]/mL — ABNORMAL HIGH

## 2022-06-16 LAB — MICROALBUMIN / CREATININE URINE RATIO
Creatinine, Urine: 104 mg/dL (ref 20–320)
Microalb Creat Ratio: 7 mcg/mg creat (ref <30)
Microalb, Ur: 0.7 mg/dL

## 2022-06-16 NOTE — Progress Notes (Signed)
<><><><><><><><><><><><><><><><><><><><><><><><><><><><><><><><><> <><><><><><><><><><><><><><><><><><><><><><><><><><><><><><><><><> - Test results slightly outside the reference range are not unusual. If there is anything important, I will review this with you,  otherwise it is considered normal test values.  If you have further questions,  please do not hesitate to contact me at the office or via My Chart.  <><><><><><><><><><><><><><><><><><><><><><><><><><><><><><><><><> <><><><><><><><><><><><><><><><><><><><><><><><><><><><><><><><><>  -  PSA = 4.84 - sl elevated  - is about the same over the last 2-3 years                                                                                   Will forward results to Dr Gloriann Loan also  <><><><><><><><><><><><><><><><><><><><><><><><><><><><><><><><><>  - Testosterone elevated from recent shot  2-3 days ago <><><><><><><><><><><><><><><><><><><><><><><><><><><><><><><><><>  - CBC finds Normal red cell Hgb,                                            but is still suspect for iron deficiency with lower MCV of 77   - Eat more ore Veggies with Iron as Carrots, Beets , all leafy green veggies as  Spinach, Collards,  Turnip - Mustard or Mixed Greens, Kale, Asparagus, Broccoli,  Brussel Sprouts, Green Beans / peas, Soybeans, Lentils, Sweet Potatoes <><><><><><><><><><><><><><><><><><><><><><><><><><><><><><><><><> <><><><><><><><><><><><><><><><><><><><><><><><><><><><><><><><><>  - Total Chol = 155   & LDL Chol = 87  - Both  Excellent   - Very low risk for Heart Attack  / Stroke <><><><><><><><><><><><><><><><><><><><><><><><><><><><><><><><><> <><><><><><><><><><><><><><><><><><><><><><><><><><><><><><><><><>  -  TSH is borderline abnormal again,                                                but not enough to recommend starting a thyroid pill (yet)   - Please be sure not taking a supplement with high dose or amount of Biotin in it which                                                                        Will cause abnormal thyroid measurements  <><><><><><><><><><><><><><><><><><><><><><><><><><><><><><><><><> <><><><><><><><><><><><><><><><><><><><><><><><><><><><><><><><><>  - A1c = 5.7% is borderline elevated sugar or glucose ,   So   - Avoid Sweets, Candy & White Stuff   - White Rice, White [place], White Flour  - Breads &  Pasta <><><><><><><><><><><><><><><><><><><><><><><><><><><><><><><><><> <><><><><><><><><><><><><><><><><><><><><><><><><><><><><><><><><>  - Insulin = 39.1 is also a little elevated suggests insulin resistance   - which can be a sign of early diabetes and associated with a 300 % greater risk for  heart attacks, strokes, cancer & Alzheimer type vascular dementia   - All this can be cured  and prevented with losing weight   - get Dr Fara Olden Fuhrman's book 'the End of Diabetes" and "the End of Dieting"  and  add many years of good health to your life. <><><><><><><><><><><><><><><><><><><><><><><><><><><><><><><><><> <><><><><><><><><><><><><><><><><><><><><><><><><><><><><><><><><> <><><><><><><><><><><><><><><><><><><><><><><><><><><><><><><><><>  - Vitamin D = 67 - Great !  Please keep dose same <><><><><><><><><><><><><><><><><><><><><><><><><><><><><><><><><> <><><><><><><><><><><><><><><><><><><><><><><><><><><><><><><><><>

## 2022-06-21 ENCOUNTER — Encounter: Payer: Self-pay | Admitting: Internal Medicine

## 2022-07-12 ENCOUNTER — Other Ambulatory Visit: Payer: Self-pay

## 2022-07-13 ENCOUNTER — Other Ambulatory Visit: Payer: Self-pay | Admitting: Internal Medicine

## 2022-07-13 DIAGNOSIS — I10 Essential (primary) hypertension: Secondary | ICD-10-CM

## 2022-07-13 MED ORDER — LISINOPRIL 20 MG PO TABS: ORAL_TABLET | ORAL | 3 refills | Status: AC

## 2022-07-18 ENCOUNTER — Other Ambulatory Visit: Payer: Self-pay | Admitting: Nurse Practitioner

## 2022-07-18 DIAGNOSIS — M5136 Other intervertebral disc degeneration, lumbar region: Secondary | ICD-10-CM

## 2022-07-18 DIAGNOSIS — E349 Endocrine disorder, unspecified: Secondary | ICD-10-CM

## 2022-07-18 DIAGNOSIS — M67911 Unspecified disorder of synovium and tendon, right shoulder: Secondary | ICD-10-CM

## 2022-07-18 DIAGNOSIS — Z79899 Other long term (current) drug therapy: Secondary | ICD-10-CM

## 2022-07-26 ENCOUNTER — Ambulatory Visit: Payer: 59 | Admitting: Internal Medicine

## 2022-08-01 NOTE — Progress Notes (Unsigned)
Future Appointments  Date Time Provider Department  08/02/2022 11:30 AM Lucky Cowboy, MD [facility]  10/03/2022          3 mo  9:30 AM Adela Glimpse, NP [facility]  06/20/2023            cpe  2:00 PM Lucky Cowboy, MD [facility]    History of Present Illness:       This very nice 63 y.o.  MWM with   HTN, HLD, Prediabetes, GERD, OSA /CPAP and Vitamin D Deficiency.  Today patient is also c/o Rt shoulder pain.       At recent CPE 6 weeks ago - Mar 6, patient's  BP was elevated at 160 /98 - confirmed  & he had issues with LUTS & was started on Doxazosin 8 mg qhs & Finasteride. He reports that he's voiding much better.   Also TSH was slightly elevated at 5.25 and  CBC showed MCV 77 suspect for Iron Deficiency - especially since on a PPI .  He did have a low iron sat 8%  1 year ago  & Vit B12 was 466 - borderline low .   Also A1c was borderline  abnormal  - at 5.7%     Current Outpatient Medications on File Prior to Visit  Medication Sig   ADDERALL 20 MG tablet Take 1/2 to 1 tablet 1 or 2 x /day for Focus & Concentration   butalbital-acetaminophen-caffeine (FIORICET) 50-325-40 MG tablet TAKE 1 TO 2 TABLETS 4 TIMES A DAY ONLY IF NEEDED FOR SEVERE HEADACHE   doxazosin (CARDURA) 8 MG tablet Take 1 tablet at Bedtime for BP & Prostate   finasteride (PROSCAR) 5 MG tablet Take 1 tablet at Bedtime for Prostate   lisinopril (ZESTRIL) 20 MG tablet Take 1 tablet Daily for BP   omeprazole (PRILOSEC) 40 MG capsule TAKE 1 CAPSULE  DAILY    pregabalin (LYRICA) 75 MG capsule TAKE 1 CAPSULE TWICE DAILY FOR PAIN   rosuvastatin (CRESTOR) 5 MG tablet Take  1 tablet  Daily f   testosterone cypio 200 MG/ML inject ADMINISTER 1 ML IM EVERY 7 DAYS   traMADol (ULTRAM) 50 MG tablet Take 1 tablet every 4 hours ONLY if needed for SEVERE pain     No Known Allergies   Problem list He has Hyperlipidemia, mixed; Essential hypertension; DDD (degenerative disc disease), lumbar; OSA (obstructive sleep  apnea); Testosterone deficiency; Vitamin D deficiency; Medication management; Insulin resistance / Prediabetes; Overweight (BMI 25.0-29.9); Rotator cuff disorder, right; Attention deficit disorder (ADD) without hyperactivity; and Elevated PSA on their problem list.   Observations/Objective:  BP 124/78   Pulse 80   Temp 97.9 F (36.6 C)   Resp 16   Ht 5' 8.5" (1.74 m)   Wt 190 lb 3.2 oz (86.3 kg)   SpO2 98%   BMI 28.50 kg/m   HEENT - WNL. Neck - supple.  Chest - Clear equal BS. Cor - Nl HS. RRR w/o sig MGR. PP 1(+). No edema. MS- FROM w/o deformities.  Gait Nl. Neuro -  Nl w/o focal abnormalities.   Assessment and Plan:  1. Essential hypertension  - TSH   2. Benign localized prostatic hyperplasia with lower urinary tract symptoms (LUTS)   3. Hypothyroidism  - TSH   4. Iron deficiency anemia secondary to inadequate dietary iron intake  - CBC with Differential/Platelet - Iron, TIBC and Ferritin Panel   5. Vitamin B12 deficiency (dietary) anemia  - CBC with Differential/Platelet - Vitamin B12 -  Methylmalonic acid, serum   6. Medication management  - CBC with Differential/Platelet - TSH - Vitamin B12 - Methylmalonic acid, serum - Iron, TIBC and Ferritin Panel    Follow Up Instructions:        I discussed the assessment and treatment plan with the patient. The patient was provided an opportunity to ask questions and all were answered. The patient agreed with the plan and demonstrated an understanding of the instructions.       The patient was advised to call back or seek an in-person evaluation if the symptoms worsen or if the condition fails to improve as anticipated.    Oscar Maw, MD

## 2022-08-02 ENCOUNTER — Encounter: Payer: Self-pay | Admitting: Internal Medicine

## 2022-08-02 ENCOUNTER — Ambulatory Visit (INDEPENDENT_AMBULATORY_CARE_PROVIDER_SITE_OTHER): Payer: 59 | Admitting: Internal Medicine

## 2022-08-02 VITALS — BP 124/78 | HR 80 | Temp 97.9°F | Resp 16 | Ht 68.5 in | Wt 190.2 lb

## 2022-08-02 DIAGNOSIS — D518 Other vitamin B12 deficiency anemias: Secondary | ICD-10-CM

## 2022-08-02 DIAGNOSIS — I10 Essential (primary) hypertension: Secondary | ICD-10-CM

## 2022-08-02 DIAGNOSIS — N401 Enlarged prostate with lower urinary tract symptoms: Secondary | ICD-10-CM

## 2022-08-02 DIAGNOSIS — M25511 Pain in right shoulder: Secondary | ICD-10-CM

## 2022-08-02 DIAGNOSIS — E039 Hypothyroidism, unspecified: Secondary | ICD-10-CM | POA: Diagnosis not present

## 2022-08-02 DIAGNOSIS — D508 Other iron deficiency anemias: Secondary | ICD-10-CM | POA: Diagnosis not present

## 2022-08-02 DIAGNOSIS — Z79899 Other long term (current) drug therapy: Secondary | ICD-10-CM

## 2022-08-02 MED ORDER — DEXAMETHASONE 4 MG PO TABS
ORAL_TABLET | ORAL | 0 refills | Status: DC
Start: 2022-08-02 — End: 2022-11-08

## 2022-08-02 NOTE — Patient Instructions (Signed)

## 2022-08-03 ENCOUNTER — Other Ambulatory Visit: Payer: Self-pay | Admitting: Internal Medicine

## 2022-08-03 DIAGNOSIS — E039 Hypothyroidism, unspecified: Secondary | ICD-10-CM | POA: Insufficient documentation

## 2022-08-03 MED ORDER — LEVOTHYROXINE SODIUM 50 MCG PO TABS
ORAL_TABLET | ORAL | 1 refills | Status: AC
Start: 2022-08-03 — End: ?

## 2022-08-06 LAB — CBC WITH DIFFERENTIAL/PLATELET
Absolute Monocytes: 479 cells/uL (ref 200–950)
Basophils Absolute: 63 cells/uL (ref 0–200)
Basophils Relative: 1 %
Eosinophils Absolute: 208 cells/uL (ref 15–500)
Eosinophils Relative: 3.3 %
HCT: 45.4 % (ref 38.5–50.0)
Hemoglobin: 14.6 g/dL (ref 13.2–17.1)
Lymphs Abs: 1210 cells/uL (ref 850–3900)
MCH: 24.8 pg — ABNORMAL LOW (ref 27.0–33.0)
MCHC: 32.2 g/dL (ref 32.0–36.0)
MCV: 77.1 fL — ABNORMAL LOW (ref 80.0–100.0)
MPV: 9.4 fL (ref 7.5–12.5)
Monocytes Relative: 7.6 %
Neutro Abs: 4341 cells/uL (ref 1500–7800)
Neutrophils Relative %: 68.9 %
Platelets: 335 10*3/uL (ref 140–400)
RBC: 5.89 10*6/uL — ABNORMAL HIGH (ref 4.20–5.80)
RDW: 16.8 % — ABNORMAL HIGH (ref 11.0–15.0)
Total Lymphocyte: 19.2 %
WBC: 6.3 10*3/uL (ref 3.8–10.8)

## 2022-08-06 LAB — VITAMIN B12: Vitamin B-12: 498 pg/mL (ref 200–1100)

## 2022-08-06 LAB — IRON,TIBC AND FERRITIN PANEL
%SAT: 13 % (calc) — ABNORMAL LOW (ref 20–48)
Ferritin: 7 ng/mL — ABNORMAL LOW (ref 24–380)
Iron: 47 ug/dL — ABNORMAL LOW (ref 50–180)
TIBC: 353 mcg/dL (calc) (ref 250–425)

## 2022-08-06 LAB — TSH: TSH: 5.63 mIU/L — ABNORMAL HIGH (ref 0.40–4.50)

## 2022-08-06 LAB — METHYLMALONIC ACID, SERUM: Methylmalonic Acid, Quant: 111 nmol/L (ref 87–318)

## 2022-09-02 ENCOUNTER — Other Ambulatory Visit: Payer: Self-pay | Admitting: Nurse Practitioner

## 2022-09-02 DIAGNOSIS — F988 Other specified behavioral and emotional disorders with onset usually occurring in childhood and adolescence: Secondary | ICD-10-CM

## 2022-09-03 MED ORDER — AMPHETAMINE-DEXTROAMPHETAMINE 20 MG PO TABS
ORAL_TABLET | ORAL | 0 refills | Status: AC
Start: 2022-09-03 — End: ?

## 2022-09-03 MED ORDER — BUTALBITAL-APAP-CAFFEINE 50-325-40 MG PO TABS: ORAL_TABLET | ORAL | 0 refills | Status: AC

## 2022-09-08 ENCOUNTER — Other Ambulatory Visit: Payer: Self-pay

## 2022-09-08 DIAGNOSIS — E782 Mixed hyperlipidemia: Secondary | ICD-10-CM

## 2022-09-08 MED ORDER — ROSUVASTATIN CALCIUM 5 MG PO TABS
ORAL_TABLET | ORAL | 3 refills | Status: AC
Start: 2022-09-08 — End: ?

## 2022-10-03 ENCOUNTER — Ambulatory Visit: Payer: 59 | Admitting: Nurse Practitioner

## 2022-11-08 ENCOUNTER — Encounter: Payer: Self-pay | Admitting: Internal Medicine

## 2022-11-08 ENCOUNTER — Other Ambulatory Visit: Payer: Self-pay | Admitting: Internal Medicine

## 2022-11-08 DIAGNOSIS — M67911 Unspecified disorder of synovium and tendon, right shoulder: Secondary | ICD-10-CM

## 2022-11-08 DIAGNOSIS — M5136 Other intervertebral disc degeneration, lumbar region: Secondary | ICD-10-CM

## 2022-11-08 DIAGNOSIS — E349 Endocrine disorder, unspecified: Secondary | ICD-10-CM

## 2022-11-08 DIAGNOSIS — Z79899 Other long term (current) drug therapy: Secondary | ICD-10-CM

## 2022-11-08 MED ORDER — TESTOSTERONE CYPIONATE 200 MG/ML IM SOLN
INTRAMUSCULAR | 0 refills | Status: AC
Start: 2022-11-08 — End: ?

## 2022-11-08 MED ORDER — PREGABALIN 75 MG PO CAPS
ORAL_CAPSULE | ORAL | 0 refills | Status: AC
Start: 2022-11-08 — End: ?

## 2023-01-04 ENCOUNTER — Ambulatory Visit: Payer: 59 | Admitting: Internal Medicine

## 2023-06-20 ENCOUNTER — Encounter: Payer: 59 | Admitting: Internal Medicine
